# Patient Record
Sex: Male | Born: 1945 | Race: White | Hispanic: No | Marital: Married | Smoking: Never smoker
Health system: Southern US, Community
[De-identification: ages and names within clinical notes are randomized; demographics above are authoritative.]

## PROBLEM LIST (undated history)

## (undated) DIAGNOSIS — K219 Gastro-esophageal reflux disease without esophagitis: Secondary | ICD-10-CM

## (undated) DIAGNOSIS — R490 Dysphonia: Secondary | ICD-10-CM

## (undated) DIAGNOSIS — I219 Acute myocardial infarction, unspecified: Secondary | ICD-10-CM

## (undated) DIAGNOSIS — Z87442 Personal history of urinary calculi: Secondary | ICD-10-CM

## (undated) DIAGNOSIS — R519 Headache, unspecified: Secondary | ICD-10-CM

## (undated) DIAGNOSIS — E039 Hypothyroidism, unspecified: Secondary | ICD-10-CM

## (undated) DIAGNOSIS — F32A Depression, unspecified: Secondary | ICD-10-CM

## (undated) DIAGNOSIS — E119 Type 2 diabetes mellitus without complications: Secondary | ICD-10-CM

## (undated) DIAGNOSIS — G4700 Insomnia, unspecified: Secondary | ICD-10-CM

## (undated) DIAGNOSIS — T7840XA Allergy, unspecified, initial encounter: Secondary | ICD-10-CM

## (undated) DIAGNOSIS — I10 Essential (primary) hypertension: Secondary | ICD-10-CM

## (undated) DIAGNOSIS — E785 Hyperlipidemia, unspecified: Secondary | ICD-10-CM

## (undated) DIAGNOSIS — G709 Myoneural disorder, unspecified: Secondary | ICD-10-CM

## (undated) DIAGNOSIS — F419 Anxiety disorder, unspecified: Secondary | ICD-10-CM

## (undated) DIAGNOSIS — R0600 Dyspnea, unspecified: Secondary | ICD-10-CM

## (undated) DIAGNOSIS — I2510 Atherosclerotic heart disease of native coronary artery without angina pectoris: Secondary | ICD-10-CM

## (undated) DIAGNOSIS — F329 Major depressive disorder, single episode, unspecified: Secondary | ICD-10-CM

## (undated) DIAGNOSIS — T8859XA Other complications of anesthesia, initial encounter: Secondary | ICD-10-CM

## (undated) DIAGNOSIS — I119 Hypertensive heart disease without heart failure: Secondary | ICD-10-CM

## (undated) DIAGNOSIS — M1990 Unspecified osteoarthritis, unspecified site: Secondary | ICD-10-CM

## (undated) DIAGNOSIS — G4733 Obstructive sleep apnea (adult) (pediatric): Secondary | ICD-10-CM

## (undated) DIAGNOSIS — C61 Malignant neoplasm of prostate: Secondary | ICD-10-CM

## (undated) HISTORY — DX: Insomnia, unspecified: G47.00

## (undated) HISTORY — PX: EYE SURGERY: SHX253

## (undated) HISTORY — PX: OTHER SURGICAL HISTORY: SHX169

## (undated) HISTORY — DX: Type 2 diabetes mellitus without complications: E11.9

## (undated) HISTORY — DX: Depression, unspecified: F32.A

## (undated) HISTORY — DX: Atherosclerotic heart disease of native coronary artery without angina pectoris: I25.10

## (undated) HISTORY — DX: Hypothyroidism, unspecified: E03.9

## (undated) HISTORY — DX: Allergy, unspecified, initial encounter: T78.40XA

## (undated) HISTORY — DX: Malignant neoplasm of prostate: C61

## (undated) HISTORY — PX: PROSTATE SURGERY: SHX751

## (undated) HISTORY — DX: Hypertensive heart disease without heart failure: I11.9

## (undated) HISTORY — DX: Hyperlipidemia, unspecified: E78.5

## (undated) HISTORY — DX: Obstructive sleep apnea (adult) (pediatric): G47.33

## (undated) HISTORY — PX: CHOLECYSTECTOMY: SHX55

## (undated) HISTORY — PX: CORONARY ARTERY BYPASS GRAFT: SHX141

## (undated) HISTORY — PX: JOINT REPLACEMENT: SHX530

## (undated) HISTORY — PX: CARPAL TUNNEL RELEASE: SHX101

## (undated) HISTORY — DX: Essential (primary) hypertension: I10

## (undated) HISTORY — DX: Major depressive disorder, single episode, unspecified: F32.9

## (undated) HISTORY — DX: Gastro-esophageal reflux disease without esophagitis: K21.9

---

## 2001-03-11 ENCOUNTER — Encounter: Admission: RE | Admit: 2001-03-11 | Discharge: 2001-06-09 | Payer: Self-pay | Admitting: Family Medicine

## 2004-01-20 ENCOUNTER — Inpatient Hospital Stay (HOSPITAL_COMMUNITY): Admission: AD | Admit: 2004-01-20 | Discharge: 2004-01-26 | Payer: Self-pay | Admitting: Cardiology

## 2004-01-20 ENCOUNTER — Encounter: Payer: Self-pay | Admitting: *Deleted

## 2004-02-21 ENCOUNTER — Encounter (HOSPITAL_COMMUNITY): Admission: RE | Admit: 2004-02-21 | Discharge: 2004-05-21 | Payer: Self-pay | Admitting: Cardiology

## 2004-08-22 ENCOUNTER — Ambulatory Visit (HOSPITAL_COMMUNITY): Admission: RE | Admit: 2004-08-22 | Discharge: 2004-08-22 | Payer: Self-pay | Admitting: Cardiology

## 2004-09-06 ENCOUNTER — Inpatient Hospital Stay (HOSPITAL_COMMUNITY): Admission: RE | Admit: 2004-09-06 | Discharge: 2004-09-11 | Payer: Self-pay | Admitting: Orthopedic Surgery

## 2005-07-31 ENCOUNTER — Encounter (INDEPENDENT_AMBULATORY_CARE_PROVIDER_SITE_OTHER): Payer: Self-pay | Admitting: *Deleted

## 2005-07-31 ENCOUNTER — Ambulatory Visit (HOSPITAL_COMMUNITY): Admission: RE | Admit: 2005-07-31 | Discharge: 2005-07-31 | Payer: Self-pay | Admitting: Gastroenterology

## 2006-03-21 ENCOUNTER — Ambulatory Visit (HOSPITAL_COMMUNITY): Admission: RE | Admit: 2006-03-21 | Discharge: 2006-03-21 | Payer: Self-pay | Admitting: Urology

## 2006-05-27 ENCOUNTER — Inpatient Hospital Stay (HOSPITAL_COMMUNITY): Admission: RE | Admit: 2006-05-27 | Discharge: 2006-05-28 | Payer: Self-pay | Admitting: Urology

## 2006-05-28 ENCOUNTER — Encounter (INDEPENDENT_AMBULATORY_CARE_PROVIDER_SITE_OTHER): Payer: Self-pay | Admitting: *Deleted

## 2006-09-11 ENCOUNTER — Inpatient Hospital Stay (HOSPITAL_COMMUNITY): Admission: RE | Admit: 2006-09-11 | Discharge: 2006-09-15 | Payer: Self-pay | Admitting: Orthopedic Surgery

## 2010-08-30 ENCOUNTER — Ambulatory Visit: Payer: Self-pay | Admitting: Cardiology

## 2010-09-01 ENCOUNTER — Ambulatory Visit: Payer: Self-pay | Admitting: Cardiology

## 2010-10-12 ENCOUNTER — Telehealth (INDEPENDENT_AMBULATORY_CARE_PROVIDER_SITE_OTHER): Payer: Self-pay | Admitting: Radiology

## 2010-10-16 ENCOUNTER — Encounter: Payer: Self-pay | Admitting: Cardiology

## 2010-10-16 ENCOUNTER — Encounter (HOSPITAL_COMMUNITY)
Admission: RE | Admit: 2010-10-16 | Discharge: 2010-11-28 | Payer: Self-pay | Source: Home / Self Care | Attending: Cardiology | Admitting: Cardiology

## 2010-10-16 ENCOUNTER — Ambulatory Visit: Payer: Self-pay

## 2010-11-30 NOTE — Assessment & Plan Note (Signed)
Summary: Cardiology Nuclear Testing  Nuclear Med Background Indications for Stress Test: Evaluation for Ischemia, Graft Patency   History: CABG, Heart Catheterization, Myocardial Infarction, Myocardial Perfusion Study  History Comments: 3/05 MI>CABG; 10/05 Cath:Occ. SVG>PDA & PLA>RCA (Medical therapy); '10 IRS:WNIOEVOJ scar w/ minimal per-infarct ischemia, EF=60%    Symptoms: Chest Pressure, Chest Pressure with Exertion, DOE, Fatigue  Symptoms Comments: Last episode of CP:one month ago.   Nuclear Pre-Procedure Cardiac Risk Factors: Family History - CAD, Hypertension, Lipids, NIDDM Caffeine/Decaff Intake: none NPO After: 7:30 PM Lungs: Clear IV 0.9% NS with Angio Cath: 22g     IV Site: R Hand IV Started by: Doyne Keel, CNMT Chest Size (in) 46     Height (in): 69 Weight (lb): 218 BMI: 32.31 Tech Comments: held meds  Nuclear Med Study 1 or 2 day study:  1 day     Stress Test Type:  Stress Reading MD:  Marca Ancona, MD     Referring MD:  Cassell Clement, MD Resting Radionuclide:  Technetium 47m Tetrofosmin     Resting Radionuclide Dose:  11.0 mCi  Stress Radionuclide:  Technetium 23m Tetrofosmin     Stress Radionuclide Dose:  33.0 mCi   Stress Protocol Exercise Time (min):  10:00 min     Max HR:  136 bpm     Predicted Max HR:  156 bpm  Max Systolic BP: 172 mm Hg     Percent Max HR:  87.18 %     METS: 11.3 Rate Pressure Product:  50093    Stress Test Technologist:  Rea College, CMA-N     Nuclear Technologist:  Doyne Keel, CNMT  Rest Procedure  Myocardial perfusion imaging was performed at rest 45 minutes following the intravenous administration of Technetium 83m Tetrofosmin.  Stress Procedure  The patient exercised for ten minutes.  The patient stopped due to fatigue.  He c/o chest pressure, 3/10, with exercise.  There were no diagnostic ST-T wave changes, only occasional PVC's with rare couplets.  Technetium 70m Tetrofosmin was injected at peak exercise and myocardial  perfusion imaging was performed after a brief delay.  QPS Raw Data Images:  Normal; no motion artifact; normal heart/lung ratio. Stress Images:  Moderate perfusion defect throughout the inferior wall.  Rest Images:  Basal to mid inferior perfusion defect.  Subtraction (SDS):  Partially reversible inferior perfusion defect.  Transient Ischemic Dilatation:  0.87  (Normal <1.22)  Lung/Heart Ratio:  0.37  (Normal <0.45)  Quantitative Gated Spect Images QGS EDV:  80 ml QGS ESV:  32 ml QGS EF:  60 % QGS cine images:  Basal inferior hypokinesis.    Overall Impression  Exercise Capacity: Excellent exercise capacity. BP Response: Normal blood pressure response. Clinical Symptoms: Mild chest pressure  ECG Impression: No significant ST segment change suggestive of ischemia. Overall Impression: Partially reversible moderate-sized inferior perfusion defect suggests infarction with a significant component of adjacent ischemia.   Appended Document: Cardiology Nuclear Testing copysent to Dr. Patty Sermons

## 2010-11-30 NOTE — Progress Notes (Signed)
Summary: Nuc Pre-Procedure  Phone Note Outgoing Call Call back at Work Phone [phone]   Call placed by: Leonia Corona, RT-N,  October 12, 2010 2:59 PM Call placed to: Patient Reason for Call: Confirm/change Appt Summary of Call: Left message with information on My[employer] Information Sheet (see scanned document for details).      Nuclear Med Background Indications for Stress Test: Evaluation for Ischemia, Graft Patency   History: CABG, Heart Catheterization, Myocardial Infarction, Myocardial Perfusion Study  History Comments: 3/05- MI >CABG(X6) 10/05- Cath- Occluded SVG-PDA/PLA-RCA (Medical therapy) 12/10- MPS- Inf. scar w/ minimal per-infarct ischemia. EF= 60%    Symptoms: Chest Pain  Symptoms Comments: Chest pain with radiation into left arm   Nuclear Pre-Procedure Cardiac Risk Factors: Hypertension, Lipids, NIDDM

## 2011-01-01 ENCOUNTER — Other Ambulatory Visit: Payer: Self-pay

## 2011-01-03 ENCOUNTER — Ambulatory Visit: Payer: Self-pay | Admitting: Cardiology

## 2011-02-23 ENCOUNTER — Other Ambulatory Visit: Payer: Self-pay | Admitting: *Deleted

## 2011-02-26 ENCOUNTER — Ambulatory Visit: Payer: Self-pay | Admitting: Cardiology

## 2011-02-27 ENCOUNTER — Other Ambulatory Visit: Payer: Self-pay | Admitting: *Deleted

## 2011-02-27 DIAGNOSIS — E119 Type 2 diabetes mellitus without complications: Secondary | ICD-10-CM

## 2011-02-27 DIAGNOSIS — E7800 Pure hypercholesterolemia, unspecified: Secondary | ICD-10-CM

## 2011-03-16 NOTE — Op Note (Signed)
[name], [name]             ACCOUNT NO.:  [id number]   MEDICAL RECORD NO.:  [id number]          PATIENT TYPE:  INP   LOCATION:  0002                         FACILITY:  [facility]   PHYSICIAN:  Georges Lynch. Gioffre, M.D.DATE OF BIRTH:  1945-10-30   DATE OF PROCEDURE:  09/11/2006  DATE OF DISCHARGE:                                 OPERATIVE REPORT   SURGEON:  Georges Lynch. Darrelyn Hillock, M.D.   ASSISTANT:  Arlyn Leak, PA   PREOPERATIVE DIAGNOSIS:  Degenerative arthritis, left knee.   POSTOPERATIVE DIAGNOSIS:  Degenerative arthritis, left knee.   OPERATION:  Left total knee arthroplasty utilizing DePuy system.  All three  components were cemented.  I utilized a Designer, industrial/product insert.  The sizes used were 38 mm patella.  The femoral component was a size 5 left  posterior cruciate sacrificing, the tibial tray with a size 5 cemented, the  tibial insert was a size 5 10 mm thickness.  Vancomycin was used in the  cement.   PROCEDURE:  Under general anesthesia, routine orthopedic prep and draping of  the left lower extremity carried out.  He had 600 mg of clindamycin IV  preop.  Following that after the prep and drape carried out.  We elevated  tourniquet after exsanguinating the extremity with an Esmarch.  We elevated  the tourniquet and 350 mmHg.  Following that incision was made over the  anterior aspect of the left knee.  Bleeders identified and cauterized.  At  this time two flaps were created.  Self-retaining retractors were inserted.  I then carried out a median parapatellar approach reflected patella  laterally and then flexed the knee and did medial and lateral meniscectomy  and excised the anterior and posterior cruciate ligaments.  Following that,  initial drill hole was made in the intercondylar notch,  #1 jig was inserted  10 mm thickness was removed from the distal femur.  A #2 jig then was  inserted.  I carried out my anterior posterior and chamfer cuts in the usual  fashion for a size 5 posterior cruciate sacrificing femoral component.  After that we then prepared the tibia.  We removed the spurs the tibia,  debrided all the synovium in the area.  We then measured our tibia to be  approximately a size 5.  We then made our initial drill hole the tibial  plateau inserted #1 jig and removed 6 mm thickness off of the affected left  medial side of the tibia.  This time we then utilized our spacers.  Once we  were happy with our soft tissue tensioning, we then cut our notch cut out of  the distal femur in the usual fashion.  We after the femur was prepared, we  then inserted our trial components went through trial range of motion, had  excellent stability in all planes in excellent motion.  We then cut our  patella.  We did a resurfacing patella for a size 38 mm patella.  Then three  drill holes were made in the patella, the articular surface of the patella  that is.  We then inserted the  trial patella.  It fit quite nicely.  We then  removed all trial components, thoroughly water picked out the knee.  We  injected the knee then with 1/2% Marcaine about 60 mL and also with Toradol  30 mg.  Once we did this we then water picked out the knee, dried the knee  out cemented all three components in simultaneously.  We removed all loose  pieces of cement.  We inserted our trial components tibial component.  We  felt a size 5 was a stable fit.  We then removed the trial tibial component,  irrigated knee out again, searched for cement, made sure there no loose  pieces of cement.  We then inserted our permanent tibial rotating  platform size 5 10 mm thickness, reduced the knee went through motion had  excellent stability.  We then utilized the thrombin spray, we inserted our  Hemovac drain and closed the wound layers usual fashion.  Skin was closed  metal staples.  Sterile Neosporin dressing was applied.  The patient had  clindamycin 600 mg IV preop.            ______________________________  Georges Lynch. Darrelyn Hillock, M.D.     RAG/MEDQ  D:  09/11/2006  T:  09/11/2006  Job:  [id number]   cc:   Cassell Clement, M.D.  Fax: [phone]

## 2011-03-16 NOTE — Consult Note (Signed)
NAME:  Paul [name], Paul [name]                       ACCOUNT NO.:  [id number]   MEDICAL RECORD NO.:  [id number]                   PATIENT TYPE:  INP   LOCATION:  2311                                 FACILITY:  [facility]   PHYSICIAN:  Evelene Croon, M.D.                  DATE OF BIRTH:  [birth year]   DATE OF CONSULTATION:  01/20/2004  DATE OF DISCHARGE:                                   CONSULTATION   REASON FOR CONSULTATION:  Severe three vessel coronary artery disease,  status post acute myocardial infarction, with unstable angina.   HISTORY OF PRESENT ILLNESS:  This patient is a 65 year old white male with a  history of hypertension, type 2 diabetes, and known heart disease, who has  not followed up with a cardiologist for many years.  Last Sunday, he  developed prolonged substernal chest pain at rest that lasted about 6 hours.  This pain waxed and waned, and then resolved completely.  He said that her  had no chest pain on Monday, but felt tired.  During the week, he had some  exertional-related chest tightness.  On the evening of January 19, 2004, he  developed chest pain at rest that persisted all night, and radiated into  both arms and shoulder blades.  He had mild nausea and diaphoresis.  He did  not seek attention until this morning when his chest pain continued.  His  initial CK was 4.7, and his troponin level was 4.47.  Electrocardiogram  showed normal sinus rhythm with left ventricular hypertrophy and inferior  infarction of undetermined age with T wave inversions with lateral ischemia.  He was taken to the cardiac catheterization laboratory with ongoing chest  pain this afternoon.  This showed about 30% distal left main stenosis.  The  LAD had 90% proximal bifurcation stenosis at the take off and first  diagonal.  There was diffuse 80% to 90% mid-LAD stenosis, and 80% osteal  first diagonal stenosis.  The left circumflex had 95% proximal and first  marginal stenosis with an occlusion of  a large sub-branch of OM-1.  There  was 80% distal left circumflex stenosis.  The right coronary artery was a  large vessel with 99% stenosis at the distal bifurcation of the posterior  descending and posterolateral branches with TIMI-1 flow.  There were left to  right collaterals.  Left ventricular ejection fraction about 55%, with  inferior hypokinesis.  The patient had ongoing chest pain with  catheterization that he rated 2-3 out of 10, despite maximal medical  treatment, and therefore an anterior balloon pump was placed.  At the  present time, he said that his pain was essentially gone.   PAST MEDICAL HISTORY:  1. Significant for type 2 diabetes for 2-3 years.  He does not check his     sugars regularly.  2. History of hypertension.  3. History of hypercholesterolemia.  4. History of kidney stones.  5. History of arthritis involving his knees.  He is status post right knee     surgery x2 for degenerative disease.   MEDICATIONS:  1. Dilacor 240 mg daily.  2. Cozaar 100 mg daily.  3. Glucophage 500 mg daily.  4. Sudafed p.r.n.  5. Guaifenesin p.r.n.  6. Flonase p.r.n.  7. Mucinex p.r.n.   ALLERGIES:  PENICILLIN.   REVIEW OF SYSTEMS:  GENERAL:  He denies fever or chills.  Has had no recent  weight changes.  He has had at least a 1-year history of fatigue.  HEENT:  Eyes negative.  ENT - he does have sinus problems.  ENDOCRINE:  He has type  2 diabetes.  Denies hypothyroidism.  CARDIOVASCULAR:  As above.  He has had  some exertional shortness of breath over the past year.  He denies  palpitations.  He has had no peripheral edema.  Denies PND or orthopnea.  RESPIRATORY:  Denies cough or sputum production.  GI:  He has had no nausea  or vomiting.  He denies melena and bright red blood per rectum.  He reports  a long history of heartburn type symptoms.  GU:  He denies dysuria and  hematuria.  PSYCHIATRIC:  Negative.  NEUROLOGICAL:  He denies any focal  weakness or numbness.   Denies dizziness and syncope.  MUSCULOSKELETAL:  He  has osteoarthritis of his right knee.   FAMILY HISTORY:  Positive for hypertension and stroke.   SOCIAL HISTORY:  He is married with 2 children.  He has never smoked.  He  does not drink alcohol.  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   PHYSICAL EXAMINATION:  VITAL SIGNS:  His blood pressure is 122/65, and his  pulse is 68 and regular, respiratory rate 16 and unlabored.  GENERAL:  He is an obese white male in no distress.  HEENT:  Normocephalic and atraumatic.  Pupils equal, round and reactive to  light and accommodation.  Extraocular muscles are intact.  His throat is  clear.  NECK:  Normal carotid pulses bilaterally.  There are no bruits, no  adenopathy.  There is no thyromegaly.  CARDIAC:  Regular rate and rhythm with normal S1 and S2.  There is no  murmur, rub, or gallop.  LUNGS:  Clear.  ABDOMEN:  Active bowel sounds.  His abdomen was soft, obese, and nontender.  There were no palpable masses or organomegaly.  EXTREMITIES:  There is no peripheral edema.  There are old scars on the  right knee from a previous surgery.  Pedal pulses are palpable bilaterally.  SKIN:  Warm and dry.  NEUROLOGIC EXAM:  Alert and oriented x3.  Motor and sensory exam is grossly  normal.   IMPRESSION:  Mr. Frei has severe three vessel coronary disease,  presenting with a subacute myocardial infarction and ongoing chest pain.  I  agree that coronary artery bypass graft surgery is the best treatment for  this patient.  I discussed the operative procedure with him and his wife and  son, including alternatives, benefits, and risks, including bleeding, blood  transfusion, infection, stroke, myocardial infarction, and they understand  and agree to proceed with surgery.  We will plan to perform this in the  morning, on January 21, 2004.  Evelene Croon, M.D.    BB/MEDQ  D:   01/20/2004  T:  01/22/2004  Job:  [id number]

## 2011-03-16 NOTE — Cardiovascular Report (Signed)
Paul [name], WINTERBOTTOM NO.:  [id number]   MEDICAL RECORD NO.:  [id number]          PATIENT TYPE:  OIB   LOCATION:  2899                         FACILITY:  [facility]   PHYSICIAN:  Peter M. Swaziland, M.D.  DATE OF BIRTH:  05/28/1946   DATE OF PROCEDURE:  08/22/2004  DATE OF DISCHARGE:                              CARDIAC CATHETERIZATION   INDICATION FOR PROCEDURE:  A 65 year old white male, status post inferior  myocardial infarction with subsequent coronary artery bypass surgery in  March of 2005.  He has had recent symptoms of exertional angina and  Cardiolite study showed evidence of inferior wall ischemia.   PROCEDURES:  1.  Left heart catheterization.  2.  Coronary angiography.  3.  Saphenous vein graft angiography x 3.  4.  Left internal mammary artery graft angiography.  5.  Left ventriculography.   ACCESS:  Via the right femoral artery using standard Seldinger technique.   EQUIPMENT:  1.  6 French 4 cm right and left Judkins catheter.  2.  6 French pigtail catheter.  3.  6 French LIMA catheter.  4.  6 French LCB catheter.   MEDICATIONS:  1.  Local anesthesia with 1% Xylocaine.  2.  Versed 2 mg IV.   CONTRAST:  200 mL of Omnipaque.   HEMODYNAMIC DATA:  Aortic pressure 145/86 with a mean of 111.  Left  ventricular pressure 145 with an EDP of 13 mmHg.   ANGIOGRAPHIC DATA:  The left coronary artery arises and distributes  normally.  The left main coronary artery has 20% irregularities in the  mid  vessel.   The left anterior descending artery has a 90% stenosis proximally followed  by a total occlusion of the mid vessel.  The first and second diagonal  branches were occluded proximally.   The left circumflex coronary artery gives rise to a single large marginal  vessel that is occluded proximally.  The distal circumflex is small and  diffusely diseased up to 30-40%.   The right coronary artery rises normally.  It has a complex 90% stenosis at  the  origin followed by total occlusion proximally.  The distal right  coronary artery fills by left to right collaterals.   The saphenous vein graft to the posterior descending and posterolateral  branches of the right coronary artery is occluded proximally.   The saphenous vein graft sequentially to the first diagonal and first obtuse  marginal vessel is widely patent.   The saphenous vein graft to the second diagonal is widely patent.   The LIMA graft to the LAD was very difficult to visualize.  The left  subclavian had a very acute angulation prior to the IMA and then the IMA had  another acute angulated takeoff from the subclavian.  We were able to  adequately visualize the vessel with left subclavian angiography and this  showed excellent patency throughout and excellent filling of the LAD.   Left ventriculography was performed in the RAO view.  This demonstrates  normal left ventricular size with mild inferior wall hypokinesis.  Overall  ejection fraction is estimated at 50-55%.  There  is no significant mitral  insufficiency.   FINAL INTERPRETATION:  1.  Severe three-vessel occlusive atherosclerotic coronary artery disease.  2.  Well-preserved left ventricular function.  3.  Patent saphenous vein graft to the first diagonal and first obtuse      marginal branch.  4.  Patent saphenous vein graft to the second diagonal branch.  5.  Occluded saphenous vein graft to the PDA and PLOM of the right coronary      artery.  6.  Patent LIMA graft to the LAD.   PLAN:  Since the right coronary vessels are supplied by good collaterals, I  would recommend continued medical therapy at this point.  Neither the  occluded vein graft or the native right coronary artery appeared to be  suitable for catheter based intervention.       PMJ/MEDQ  D:  08/22/2004  T:  08/22/2004  Job:  [id number]   cc:   Cassell Clement, M.D.  [address], [address]  [place]  [place] [address]  Fax: [phone]    Evelene Croon, M.D.  [address]  [place]  [place] [address]  Fax: [phone]   Jamie Kato  [address]  [place]  [place] [address]  Fax: [phone]

## 2011-03-16 NOTE — H&P (Signed)
Paul [name], Paul [name]             ACCOUNT NO.:  [id number]   MEDICAL RECORD NO.:  [id number]          PATIENT TYPE:  INP   LOCATION:  NA                           FACILITY:  [facility]   PHYSICIAN:  Georges Lynch. Gioffre, M.D.DATE OF BIRTH:  04-02-1946   DATE OF ADMISSION:  09/11/2006  DATE OF DISCHARGE:                                HISTORY & PHYSICAL   CHIEF COMPLAINT:  Left knee pain.   HISTORY OF PRESENT ILLNESS:  Patient is a 65 year old gentleman who will be  admitted on November 14th for a left total knee arthroplasty that patient  has a medial joint near bone-on-bone on x-rays.  He has complaints of pain  in his knee, difficulty with range of motion.  He has failed conservative  treatments.  Plan to proceed with a total knee arthroplasty.   ALLERGIES:  PENICILLIN, YELLOW JACKETS.   CURRENT MEDICATIONS:  1. Toprol XL 50 mg a day.  2. Hyzaar 100 mg a day.  3. Norvasc 5 mg a day.  4. Glucophage 500 mg, two tablets a day.  5. Zocor 20 mg a day.  6. Prilosec 20 mg a day.  7. Ecotrin 325 mg a day.  8. Flonase p.r.n.  9. Mucinex p.r.n.  10.Centrum Silver, one tablet a day.  11.Fish oil 1000 mg a day.   PAST MEDICAL HISTORY:  1. He does have some hypertension and a history of some coronary artery      disease, with neck swollen, stable angina.  2. History of hiatal hernia and reflux disease.  3. Hemorrhoids.  4. Prostate cancer with urinary incontinence.  5. History of kidney stones.  6. Past history of asthma.  7. History of anxiety/depression.  8. Diabetes.   PAST SURGICAL HISTORY:  1. Arthroscopic surgery on his right knee.  2. Six-way heart bypass surgery in 2005.  3. Right knee replacement in 2005.  4. Prostatectomy 2007.  5. Patient indicates that his only complications were with hiccups and      hallucinations.   MEDICAL DOCTOR:  Cassell Clement, M.D.  Urology is Dr. Wanda Plump in [place].   FAMILY MEDICAL HISTORY:  Both parents and maternal grandparents have  heart  disease.  Both parents have hypertension.  Sister and maternal grandmother  have diabetes.  Does have a history of breast cancer in the family on his  sister's side.   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  He has two sons.  He  lives in a two-story house.   REVIEW OF SYSTEMS:  Positive for some blurred vision.  It has been many  years, since he has had his last asthma attack.  He has had some bronchitis  pneumonia in the past.  He does have some occasional shortness of breath  with exertion.  He does currently come dealing with urinary incontinence,  since his prostatectomy.  Last kidney stone was about 10 years ago.   PHYSICAL EXAMINATION:  VITAL SIGNS:  His height is 5' 8, weight is 212  pounds, blood pressure is  126/82.  Pulse is 70 and regular, respirations 12.  Patient is afebrile.  GENERAL:  This is a healthy-appearing well-developed, 65 year old gentleman,  conscious, alert and appropriate, easily gets himself on and off the exam  table, appears to be in no extreme distress.  HEENT:  Head is normocephalic.  Pupils equal, round and reactive.  Extraocular movements intact.  He does have an upper denture plate in place.  Buccal mucosa appears to be pink and moist.  NECK:  Supple.  No palpable lymphadenopathy.  Thyroid region was nontender.  He had good range of motion of his cervical spine.  CHEST:  Lung sounds were clear and equal bilaterally.  No wheezes, rales,  rhonchi present.  HEART:  He had a well-healed midline sternal incision.  His heart sounds are  regular in rhythm, no obvious murmurs.  ABDOMEN:  Soft, nontender, normoactive bowel sounds.  EXTREMITIES:  Upper extremities were symmetrical in size and shape.  He had  excellent range of motion of the shoulders, elbows and wrists.  Motor  strength was 5/5.  Lower extremities:  Right and left hip had full  extension, flexion  up to 130 degrees with 20 degrees internal, external  rotation without any difficulty.  Right knee had a well-healed midline  anterior incision, no effusion.  He had a typical mechanical total knee  clunk, but he had full extension, flexion, back to 110 degrees.  No  instability.  The cap was soft.  His left knee was boggy appearing with no  signs of erythema.  He was able to fully extend.  He was able to flex it  back to 100 degrees.  He had no instability.  Calf was soft, nontender.  Ankles were symmetric with good dorsi-plantar flexion.  Peripheral vascular  and carotid pulses were 2+ with no bruits.  Radial pulses were 2+.  He had  2+ dorsalis pedis pulses.  He had no lower extremity edema.  NEURO:  Patient was conscious, alert and appropriate.  No gross neurologic  defects noted.  Breast, rectal and GU exams were deferred at this time   Results of recent cardiac stress test by Dr. Patty Sermons was listed at stable.  Overall impression indicated old inferior wall scar with minimal  reversibility, good overall LV systolic function, improved ejection  fraction, and exercise time since 2006 Cardiolite.  Ejection fraction was  estimated at 58%.   PLAN:  Patient will undergo all routine labs and tests, prior to having a  left total knee arthroplasty by Dr. Darrelyn Hillock at [facility] on  November 14th.      Jamelle Rushing, P.A.    ______________________________  Georges Lynch Darrelyn Hillock, M.D.    RWK/MEDQ  D:  09/06/2006  T:  09/06/2006  Job:  [id number]   cc:   Windy Fast A. Darrelyn Hillock, M.D.  Fax: [phone]

## 2011-03-16 NOTE — Discharge Summary (Signed)
Paul [name], Paul [name]             ACCOUNT NO.:  [id number]   MEDICAL RECORD NO.:  [id number]          PATIENT TYPE:  INP   LOCATION:  1521                         FACILITY:  [facility]   PHYSICIAN:  Georges Lynch. Gioffre, M.D.DATE OF BIRTH:  02-07-1946   DATE OF ADMISSION:  09/11/2006  DATE OF DISCHARGE:  09/15/2006                               DISCHARGE SUMMARY   ADMISSION DIAGNOSES:  1. End-stage osteoarthritis left knee.  2. Hypertension.  3. Coronary artery disease.  4. Hiatal hernia with reflux.  5. Prostate cancer with urinary incontinence.  6. History of kidney stones.  7. History of  depression.  8. Diabetes.   DISCHARGE DIAGNOSES:  1. Left total knee arthroplasty.  2. History of coronary artery disease.  3. Stable angina.  4. Hypertension.  5. Hiatal hernia with reflux disease.  6. History of prostate cancer with urinary incontinence.  7. History of  anxiety and depression.  8. History of diabetes.   HISTORY OF PRESENT ILLNESS:  The patient is a 65 year old gentleman who  is __________ related to his left knee who had painful range of motion,  ambulating who has failed conservative treatment. X-rays reveal bone on  bone of the medial compartment.  The patient wishes to proceed with  total knee arthroplasty.   ALLERGIES:  PENICILLIN AND YELLOWJACKET'S.   MEDICATIONS ON ADMISSION:  1. Toprol XL 50 mg a day.  2. Hyzaar 100 mg a day.  3. Norvasc 5 mg a day.  4. Glucophage 500 mg two tablets a day.  5. Zocor 20 mg a day.  6. Prilosec 20 mg a day.  7. Ecotrin 325 mg  a day.  8. Flonase p.r.n.  9. Mucinex p.r.n.  10.Centrum Silver one tablet a day.  11.Fish oil 1,000 mg a day.   SURGICAL PROCEDURES:  The patient underwent a left total knee  arthroplasty by Dr. Darrelyn Hillock at [facility] on November 14.  __________  He insisted on general anesthesia. The patient had the  following components implanted:  Size #5 left femoral component, a size  #5 heel to tibial  tray, size #5 10-mm polyethelene bearing, size 38, a 3-  pegged patella __________ and vancomycin __________ .   The patient tolerated the procedure well. Was transferred to recovery  room on the Orthopedic floor in good condition.   CONSULTS:  The following routine consults were requested:  Physical  therapy __________.   HOSPITAL COURSE:  On September 11, 2006 the patient was taken to [facility] under the care of Dr. Darrelyn Hillock.  The patient was taken to  the OR for a left total knee arthroplasty performed without any  complications. The patient was transferred to the recovery room and then  to the orthopedic floor for routine total knee protocol on IV  antibiotics and pain medicines and DVT prophylaxis.   The patient had a total of a 3 days postoperative course in which the  patient had no significant untoward events. The patient was able to  transition from IV medicines to p.o. meds as well. His wound remained  without any signs of infection.  His leg remained neurovascularly  intact.  He had no other medical issues which occurred. The patient  worked well with physical therapy. It was felt on postop day #3 he was  orthopedically ready for discharge to home with outpatient physical  therapy protocol.  He was discharged in good condition.   LABORATORY DATA:  H&H on November 17, date of discharge, was 10.7 with  hematocrit 30.3. INR was 4. Routine chemistries:  Sodium 130, potassium  3.4, glucose 31, BUN 18, creatinine __________. Urinalysis on admission  was normal.   MEDICATIONS UPON DISCHARGE FROM ORTHOPEDIC FLOOR:  1. Heparin 5000 units subcu q.12 h.  2. Colace 100 mg p.o. b.i.d.  3. Ferrous sulfate 325 mg p.o. t.i.d.  4. Reglan 10 mg p.o. q.8 h. p.r.n.  5. Phenergan 25 mg p.o. q.6 h. p.r.n.  6. Robaxin 500 mg p.o. q.6 h. p.r.n.  7. Toprol XL 50 mg p.o. daily.  8. Cozaar 100 mg a day.  9. Hydrochlorothiazide 12.5 mg a day.  10.Zocor 20 mg a day.  11.Norvasc 5 mg  a day.  12.Glucophage 500 mg p.o. b.i.d.  13.Protonix 40 mg a day.  14.Multivitamin with minerals one tablet a day.  15.Omega-3 acid one tablet a day.  16.Nasonex 2 sprays daily.  17.Guaifenesin 600 mg p.o. b.i.d.  18.Percocet one or two tablets every 4-6 hours p.r.n.  19.The patient was managed on a diabetic, insulin sliding scale per      protocol.  20.Coumadin.   DIET:  The patient is maintained on 1800-calorie ADA diet.   ACTIVITY:  The patient is to ambulate with assistance, use of crutches  or walker.   WOUND CARE:  The patient is to change dressing daily.   MEDICATIONS:  1. The patient is to resume routine home meds as taken prior to      hospital with the addition of Coumadin 10 mg a day at the same      time.  2. Percocet one or two tablets every 4-6 hours for pain if needed.  3. Robaxin one tablet three times a day for muscle spasms if needed.  4. Lovenox 40 mg subcu injection, one injection a day for the next two      days.   FOLLOWUP:  The patient is to follow up with Dr. Darrelyn Hillock two weeks from  date of surgery. The patient is to call [phone] for followup  appointment.   Outpatient home health physical therapy and Coumadin management is to be  managed through [facility] services.   CONDITION UPON DISCHARGE TO HOME:  Listed as improved, good.      Jamelle Rushing, P.A.    ______________________________  Georges Lynch Darrelyn Hillock, M.D.    RWK/MEDQ  D:  10/02/2006  T:  10/02/2006  Job:  [id number]

## 2011-03-19 ENCOUNTER — Other Ambulatory Visit (INDEPENDENT_AMBULATORY_CARE_PROVIDER_SITE_OTHER): Payer: Managed Care, Other (non HMO) | Admitting: Cardiology

## 2011-03-19 ENCOUNTER — Other Ambulatory Visit (INDEPENDENT_AMBULATORY_CARE_PROVIDER_SITE_OTHER): Payer: Managed Care, Other (non HMO) | Admitting: *Deleted

## 2011-03-19 DIAGNOSIS — E119 Type 2 diabetes mellitus without complications: Secondary | ICD-10-CM

## 2011-03-19 DIAGNOSIS — Z13220 Encounter for screening for lipoid disorders: Secondary | ICD-10-CM

## 2011-03-19 DIAGNOSIS — E7800 Pure hypercholesterolemia, unspecified: Secondary | ICD-10-CM

## 2011-03-19 LAB — BASIC METABOLIC PANEL
BUN: 21 mg/dL (ref 6–23)
CO2: 27 mEq/L (ref 19–32)
Calcium: 9.2 mg/dL (ref 8.4–10.5)
Chloride: 99 mEq/L (ref 96–112)
Creatinine, Ser: 1.1 mg/dL (ref 0.4–1.5)
GFR: 74.47 mL/min (ref >60.00)
Glucose, Bld: 115 mg/dL — ABNORMAL HIGH (ref 70–99)
Potassium: 3.8 mEq/L (ref 3.5–5.1)
Sodium: 137 mEq/L (ref 135–145)

## 2011-03-19 LAB — HEPATIC FUNCTION PANEL
ALT: 33 U/L (ref 0–53)
AST: 29 U/L (ref 0–37)
Albumin: 3.6 g/dL (ref 3.5–5.2)
Alkaline Phosphatase: 30 U/L — ABNORMAL LOW (ref 39–117)
Bilirubin, Direct: 0.1 mg/dL (ref 0.0–0.3)
Total Bilirubin: 0.6 mg/dL (ref 0.3–1.2)
Total Protein: 6.3 g/dL (ref 6.0–8.3)

## 2011-03-19 LAB — HEMOGLOBIN A1C: Hgb A1c MFr Bld: 7.4 % — ABNORMAL HIGH (ref 4.6–6.5)

## 2011-03-19 LAB — LIPID PANEL
Cholesterol: 134 mg/dL (ref 0–200)
HDL: 32.5 mg/dL — ABNORMAL LOW (ref >39.00)
Total CHOL/HDL Ratio: 4
Triglycerides: 205 mg/dL — ABNORMAL HIGH (ref 0.0–149.0)
VLDL: 41 mg/dL — ABNORMAL HIGH (ref 0.0–40.0)

## 2011-03-19 LAB — LDL CHOLESTEROL, DIRECT: Direct LDL: 89.7 mg/dL

## 2011-03-21 ENCOUNTER — Encounter: Payer: Self-pay | Admitting: Cardiology

## 2011-03-21 ENCOUNTER — Ambulatory Visit (INDEPENDENT_AMBULATORY_CARE_PROVIDER_SITE_OTHER): Payer: Managed Care, Other (non HMO) | Admitting: Cardiology

## 2011-03-21 VITALS — BP 130/80 | HR 74 | Wt 221.0 lb

## 2011-03-21 DIAGNOSIS — F329 Major depressive disorder, single episode, unspecified: Secondary | ICD-10-CM

## 2011-03-21 DIAGNOSIS — Z8546 Personal history of malignant neoplasm of prostate: Secondary | ICD-10-CM | POA: Insufficient documentation

## 2011-03-21 DIAGNOSIS — E785 Hyperlipidemia, unspecified: Secondary | ICD-10-CM

## 2011-03-21 DIAGNOSIS — E119 Type 2 diabetes mellitus without complications: Secondary | ICD-10-CM

## 2011-03-21 DIAGNOSIS — I119 Hypertensive heart disease without heart failure: Secondary | ICD-10-CM

## 2011-03-21 DIAGNOSIS — Z9889 Other specified postprocedural states: Secondary | ICD-10-CM

## 2011-03-21 DIAGNOSIS — Z951 Presence of aortocoronary bypass graft: Secondary | ICD-10-CM | POA: Insufficient documentation

## 2011-03-21 DIAGNOSIS — E1169 Type 2 diabetes mellitus with other specified complication: Secondary | ICD-10-CM | POA: Insufficient documentation

## 2011-03-21 MED ORDER — METFORMIN HCL 500 MG PO TABS: 1000.0000 mg | ORAL_TABLET | Freq: Two times a day (BID) | ORAL | Status: DC

## 2011-03-21 MED ORDER — CITALOPRAM HYDROBROMIDE 20 MG PO TABS: 20.0000 mg | ORAL_TABLET | Freq: Every day | ORAL | Status: DC

## 2011-03-21 MED ORDER — METOPROLOL SUCCINATE ER 50 MG PO TB24: 50.0000 mg | ORAL_TABLET | Freq: Every day | ORAL | Status: DC

## 2011-03-21 NOTE — Assessment & Plan Note (Addendum)
The patient has a history of hypercholesterolemia.  We reviewed his lab work Which shows elevated triglycerides and a low HDL.  His A1c is elevated at 7.4.  He is going to work harder on diet and exercise and continue same medication.

## 2011-03-21 NOTE — Assessment & Plan Note (Signed)
The patient has a history of essential hypertension.  He is on an ARB and on hydrochlorothiazide.  He has not been having any headaches or dizzy spells.

## 2011-03-21 NOTE — Assessment & Plan Note (Signed)
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  He has not had any recent chest pain or angina.  His last nuclear stress test was 10/16/10 and showed partially reversible moderate size inferior perfusion defect which suggest infarction with a significant component of adjacent ischemia.  The stress test was not significantly changed from the prior studies.  The patient has not had to take any recent abnormal nitroglycerin.  He will occasionally develop some mild chest tightness relieved by rest.

## 2011-03-21 NOTE — Assessment & Plan Note (Signed)
The patient has a past history of prostate cancer.  He underwent robotic assisted laparoscopic radical prostatectomy on 05/27/2006 by Dr. Laverle Patter.  The patient is complaining of lack of energy and wonders if he might have a low testosterone condition.  I advised him to discuss that with Dr. Laverle Patter at his next visit

## 2011-03-21 NOTE — Progress Notes (Signed)
Paul [name] [facility] Date of Birth:  02-20-1946 [facility] Cardiology / [facility] [address]   [address] [place], [place]  [address] [phone]           Fax   [phone]  History of Present Illness: This 65 year old gentleman is seen for a scheduled followup office visit.  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  He does have an old inferior wall myocardial infarction with residual peri-infarct ischemia seen on his nuclear stress test most recently on 10/16/10.  His ejection fraction is normal at 60%.  He has a history of essential hypertension which has been controlled with multidrug therapy including losartan HCT, amlodipine, and metoprolol.  He has a history of dyslipidemia and is on simvastatin and fish oil.  He has a history of diabetes and is on oral agents including metformin.  Since last visit his weight has gone up 2 pounds and he has been less physically Active.  The patient does have a history of some mild depression and is on generic Celexa 20 mg daily withImprovement.  Current Outpatient Prescriptions  Medication Sig Dispense Refill  . amLODipine (NORVASC) 5 MG tablet Take 5 mg by mouth daily.        Marland Kitchen aspirin 325 MG tablet Take 325 mg by mouth daily.        . citalopram (CELEXA) 20 MG tablet Take 1 tablet (20 mg total) by mouth daily.  30 tablet  12  . fluticasone (FLONASE) 50 MCG/ACT nasal spray 2 sprays by Nasal route daily.        . GuaiFENesin (MUCINEX PO) Take by mouth. As needed       . losartan-hydrochlorothiazide (HYZAAR) 100-25 MG per tablet Take 1 tablet by mouth daily.        . metFORMIN (GLUCOPHAGE) 500 MG tablet Take 2 tablets (1,000 mg total) by mouth 2 (two) times daily with a meal. Taking 1000mg  in the am and 500mg  in the pm  90 tablet  12  . metoprolol (TOPROL-XL) 50 MG 24 hr tablet Take 1 tablet (50 mg total) by mouth daily.  30 tablet  12  . Multiple Vitamin (MULTIVITAMIN) tablet Take 1 tablet by mouth daily.          . Omega-3 Fatty Acids (FISH OIL) 1000 MG CAPS Take by mouth daily.        Marland Kitchen omeprazole (PRILOSEC) 40 MG capsule Take 40 mg by mouth daily.        . simvastatin (ZOCOR) 40 MG tablet Take 40 mg by mouth at bedtime. Taking 1/2 daily       . tadalafil (CIALIS) 5 MG tablet Take 5 mg by mouth daily as needed. Per Dr. Laverle Patter       . DISCONTD: citalopram (CELEXA) 20 MG tablet Take 20 mg by mouth daily.        Marland Kitchen DISCONTD: metFORMIN (GLUCOPHAGE) 500 MG tablet Take 1,000 mg by mouth 2 (two) times daily with a meal. Taking 1000mg  in the am and 500mg  in the pm       . DISCONTD: metoprolol (TOPROL-XL) 50 MG 24 hr tablet Take 50 mg by mouth daily.          Allergies  Allergen Reactions  . Percocet (Oxycodone-Acetaminophen)     nightmares  . Xanax     No good    Patient Active Problem List  Diagnoses  . Hx of CABG  . Diabetes mellitus  . Benign  hypertensive heart disease without heart failure  . Dyslipidemia associated with type 2 diabetes mellitus  . History of prostate cancer    History  Smoking status  . Never Smoker   Smokeless tobacco  . Not on file    History  Alcohol Use: Not on file    No family history on file.  Review of Systems: Constitutional: no fever chills diaphoresis or fatigue or change in weight.  Head and neck: no hearing loss, no epistaxis, no photophobia or visual disturbance. Respiratory: No cough, shortness of breath or wheezing. Cardiovascular: No chest pain peripheral edema, palpitations. Gastrointestinal: No abdominal distention, no abdominal pain, no change in bowel habits hematochezia or melena. Genitourinary: No dysuria, no frequency, no urgency, no nocturia. Musculoskeletal:No arthralgias, no back pain, no gait disturbance or myalgias. Neurological: No dizziness, no headaches, no numbness, no seizures, no syncope, no weakness, no tremors. Hematologic: No lymphadenopathy, no easy bruising. Psychiatric: No confusion, no hallucinations, no sleep  disturbance.    Physical Exam: Filed Vitals:   03/21/11 0957  BP: 130/80  Pulse: 74  The general appearance feels a well-developed well-nourished gentleman in no distress.Pupils equal and reactive.   Extraocular Movements are full.  There is no scleral icterus.  The mouth and pharynx are normal.  The neck is supple.  The carotids reveal no bruits.  The jugular venous pressure is normal.  The thyroid is not enlarged.  There is no lymphadenopathy.The chest is clear to percussion and auscultation. There are no rales or rhonchi. Expansion of the chest is symmetrical.The precordium is quiet.  The first heart sound is normal.  The second heart sound is physiologically split.  There is no murmur gallop rub or click.  There is no abnormal lift or heave.The abdomen is soft and nontender. Bowel sounds are normal. The liver and spleen are not enlarged. There Are no abdominal masses. There are no bruits.The pedal pulses are good.  There is no phlebitis or edema.  There is no cyanosis or clubbing.Strength is normal and symmetrical in all extremities.  There is no lateralizing weakness.  There are no sensory deficits.   Assessment / Plan: Continue same medication.  Work harder on diet and weight loss and exercise recheck in 4 months for followup office visit and fasting lab work

## 2011-04-12 ENCOUNTER — Telehealth: Payer: Self-pay | Admitting: *Deleted

## 2011-04-12 DIAGNOSIS — K30 Functional dyspepsia: Secondary | ICD-10-CM

## 2011-04-12 DIAGNOSIS — R142 Eructation: Secondary | ICD-10-CM

## 2011-04-12 NOTE — Telephone Encounter (Signed)
Patient wife phoned yesterday and I called and left message.  Never heard back and called back again later in afternoon.  Spoke with patient and he was complaining of abdominal pain, knot in top of stomach, indigestion, belching, and pain in right shoulder area and across back.  Stated pain was really bad night before last and yesterday afternoon just sore, but felt better.  Stated he and Dr. Patty Sermons had discussed some similar symptoms at a previous office visit and thought might be gallbladder related.  Discussed with Dr. Patty Sermons and schedule abdominal ultra sound for 6/15 am.

## 2011-04-12 NOTE — Telephone Encounter (Signed)
Agree with plan 

## 2011-04-13 ENCOUNTER — Ambulatory Visit
Admission: RE | Admit: 2011-04-13 | Discharge: 2011-04-13 | Disposition: A | Discharge status: Home / Self Care | Payer: Managed Care, Other (non HMO) | Source: Ambulatory Visit | Attending: Cardiology | Admitting: Cardiology

## 2011-04-13 ENCOUNTER — Telehealth: Payer: Self-pay | Admitting: *Deleted

## 2011-04-13 DIAGNOSIS — K30 Functional dyspepsia: Secondary | ICD-10-CM

## 2011-04-13 DIAGNOSIS — R142 Eructation: Secondary | ICD-10-CM

## 2011-04-13 NOTE — Telephone Encounter (Signed)
Patient advised of results of test and has appt. With Dr. Zachery Dakins on Monday

## 2011-04-16 ENCOUNTER — Telehealth: Payer: Self-pay | Admitting: Cardiology

## 2011-04-16 ENCOUNTER — Other Ambulatory Visit (INDEPENDENT_AMBULATORY_CARE_PROVIDER_SITE_OTHER): Payer: Self-pay | Admitting: General Surgery

## 2011-04-16 NOTE — Telephone Encounter (Signed)
[phone] Medication list, and any recent labs

## 2011-04-17 ENCOUNTER — Other Ambulatory Visit (INDEPENDENT_AMBULATORY_CARE_PROVIDER_SITE_OTHER): Payer: Self-pay | Admitting: General Surgery

## 2011-04-17 ENCOUNTER — Inpatient Hospital Stay (HOSPITAL_COMMUNITY)
Admission: AD | Admit: 2011-04-17 | Discharge: 2011-04-18 | DRG: 419 | Disposition: A | Discharge status: Home / Self Care | Payer: Managed Care, Other (non HMO) | Source: Ambulatory Visit | Attending: Surgery | Admitting: Surgery

## 2011-04-17 DIAGNOSIS — Z880 Allergy status to penicillin: Secondary | ICD-10-CM

## 2011-04-17 DIAGNOSIS — K8000 Calculus of gallbladder with acute cholecystitis without obstruction: Principal | ICD-10-CM | POA: Diagnosis present

## 2011-04-17 DIAGNOSIS — E119 Type 2 diabetes mellitus without complications: Secondary | ICD-10-CM | POA: Diagnosis present

## 2011-04-17 DIAGNOSIS — K8010 Calculus of gallbladder with chronic cholecystitis without obstruction: Secondary | ICD-10-CM | POA: Diagnosis present

## 2011-04-17 DIAGNOSIS — I2510 Atherosclerotic heart disease of native coronary artery without angina pectoris: Secondary | ICD-10-CM | POA: Diagnosis present

## 2011-04-17 DIAGNOSIS — Z96659 Presence of unspecified artificial knee joint: Secondary | ICD-10-CM

## 2011-04-17 DIAGNOSIS — I10 Essential (primary) hypertension: Secondary | ICD-10-CM | POA: Diagnosis present

## 2011-04-17 DIAGNOSIS — Z9861 Coronary angioplasty status: Secondary | ICD-10-CM

## 2011-04-17 LAB — GLUCOSE, CAPILLARY
Glucose-Capillary: 118 mg/dL — ABNORMAL HIGH (ref 70–99)
Glucose-Capillary: 176 mg/dL — ABNORMAL HIGH (ref 70–99)

## 2011-04-17 LAB — COMPREHENSIVE METABOLIC PANEL
AST: 35 U/L (ref 0–37)
Albumin: 4.2 g/dL (ref 3.5–5.2)
Alkaline Phosphatase: 42 U/L (ref 39–117)
BUN: 29 mg/dL — ABNORMAL HIGH (ref 6–23)
Calcium: 9.8 mg/dL (ref 8.4–10.5)
Chloride: 98 mEq/L (ref 96–112)
Glucose, Bld: 206 mg/dL — ABNORMAL HIGH (ref 70–99)
Potassium: 4.1 mEq/L (ref 3.5–5.3)
Sodium: 136 mEq/L (ref 135–145)
Total Protein: 6.9 g/dL (ref 6.0–8.3)

## 2011-04-17 LAB — CBC
HCT: 36.8 % — ABNORMAL LOW (ref 39.0–52.0)
Hemoglobin: 12.7 g/dL — ABNORMAL LOW (ref 13.0–17.0)
MCHC: 34.5 g/dL (ref 30.0–36.0)
WBC: 6.2 10*3/uL (ref 4.0–10.5)

## 2011-04-18 LAB — CBC
Platelets: 204 10*3/uL (ref 150–400)
RBC: 4.09 MIL/uL — ABNORMAL LOW (ref 4.22–5.81)
RDW: 12.4 % (ref 11.5–15.5)
WBC: 8 10*3/uL (ref 4.0–10.5)

## 2011-04-18 LAB — BASIC METABOLIC PANEL
CO2: 27 mEq/L (ref 19–32)
Chloride: 95 mEq/L — ABNORMAL LOW (ref 96–112)
GFR calc Af Amer: >60 mL/min (ref >60)
Potassium: 4.3 mEq/L (ref 3.5–5.1)
Sodium: 132 mEq/L — ABNORMAL LOW (ref 135–145)

## 2011-04-18 LAB — GLUCOSE, CAPILLARY: Glucose-Capillary: 179 mg/dL — ABNORMAL HIGH (ref 70–99)

## 2011-04-19 NOTE — Op Note (Signed)
Paul [name], Paul [name] NO.:  [id number]  MEDICAL RECORD NO.:  [id number]  LOCATION:  1537                         FACILITY:  [facility]  PHYSICIAN:  Juanetta Gosling, MDDATE OF BIRTH:  May 04, 1946  DATE OF PROCEDURE:  04/17/2011 DATE OF DISCHARGE:                              OPERATIVE REPORT   PREOPERATIVE DIAGNOSIS:  Acute cholecystitis.  POSTOPERATIVE DIAGNOSIS:  Acute cholecystitis.  PROCEDURE:  Laparoscopic cholecystectomy.  SURGEON:  Troy Sine. Dwain Sarna, MD  ASSISTANT:  Brayton El, PA-C  ANESTHESIA:  General.  SPECIMENS:  Gallbladder contents to Pathology.  ESTIMATED BLOOD LOSS:  Minimal.  COMPLICATIONS:  None.  DRAINS:  None.  DISPOSITION:  To recovery room in stable condition.  INDICATION:  This is a 65 year old male who has had about a week of right upper quadrant abdominal pain as well as back pain.  He was evaluated and underwent an ultrasound that showed a thickened gallbladder with stones that appeared to clinically have cholecystitis as well.  He is diabetic.  He was evaluated by Dr. Zachery Dakins, in her urgent office yesterday afternoon.  Dr. Zachery Dakins called him this morning to see how he is doing.  He had described he had more pain and was brought to [facility] to discuss cholecystectomy procedure.  I then met him today and discussed laparoscopic cholecystectomy with risks and benefits associated with that procedure.    After informed consent was obtained, the patient was first placed on ciprofloxacin due to his multiple allergies.  He had sequential compression devices placed on lower extremities prior to induction with anesthesia.  He was then placed under general endotracheal anesthesia without complication.  His abdomen was then prepped and draped in standard sterile surgical fashion.  A surgical time-out was then performed.  I first infiltrated 0.25% Marcaine below his umbilicus.  I then made a vertical incision with a  #11 blade, this was carried out down to the fascia.  This was entered sharply and the peritoneum was entered bluntly.  A 0 Vicryl pursestring suture was placed through the fascia. The abdomen was then insufflated to 15 mmHg pressure.  I then placed 3 further 5 mm ports in the epigastrium and right upper quadrant under direct vision after infiltration with local anesthetic without complication.  I then noted that the gallbladder had adhesions to the omentum as well as the duodenum.  These were taken down with a combination of blunt and sharp dissection until the gallbladder was able to be retracted cephalad and lateral.  There was a significant amount of scarring in the triangle of Calot and it took me about 30 minutes to dissect out and identify the cystic duct and cystic artery.  I was able also to trace a common duct as it went towards the liver as well.  The cystic duct was very short, so I did not elect to perform a cholangiogram.  I clipped the artery 3 times and divided it.  After I had obtained a critical view of safety, I then clipped the cystic duct and divided this in a similar fashion.  The gallbladder was then removed from the liver bed with some difficulty due to its appearance that did not enter into  the gallbladder.  This was then placed in an EndoCatch bag and removed from the umbilicus.  Irrigation was performed until this was clear.  Hemostasis was observed.  I did place a piece of Surgicel snow in the liver bed just due to the fact that it was very raw.  I then evacuated all the irrigant.  The Hasson was removed.  I tied the stitch down and this obliterated the defect.  I reviewed with the scope and there was no evidence of an entry injury.  I then desufflated the abdomen and removed all trocars.  The incisions were closed with 4-0 Monocryl and Dermabond.  He tolerated this well, was extubated in the operating room, and transferred to recovery room in stable  condition.     Juanetta Gosling, MD     MCW/MEDQ  D:  04/17/2011  T:  04/18/2011  Job:  [id number]  cc:   Cassell Clement, M.D. Fax: [phone]  Electronically Signed by Emelia Loron MD on 04/19/2011 12:26:11 PM

## 2011-04-25 NOTE — H&P (Signed)
Paul [name], Paul [name] NO.:  [id number]  MEDICAL RECORD NO.:  [id number]  LOCATION:  1537                         FACILITY:  [facility]  PHYSICIAN:  Juanetta Gosling, MDDATE OF BIRTH:  Mar 07, 1946  DATE OF ADMISSION:  04/17/2011 DATE OF DISCHARGE:                             HISTORY & PHYSICAL   PRIMARY CARE PHYSICIAN:  Cassell Clement, M.D.  CHIEF COMPLAINT:  Abdominal pain.  HISTORY OF PRESENT ILLNESS:  Mr. Nebel is a 65 year old gentleman who was initially referred by [facility] to Dr. Zachery Dakins for abdominal pain and had been worked up as an outpatient.  He had an ultrasound that showed evidence of gallstones with mild wall thickening and evidence of at least acute cholecystitis of a chronic nature.  He was seen by Dr. Zachery Dakins yesterday, apparently was not having any acute significant abdominal pain but persistent mild discomfort.  The decision was made to admit the patient this morning as his symptoms again have been persistent and it was felt that he would benefit from more urgent cholecystectomy versus delayed.  Therefore, the patient has been sent over for admission in preparation for cholecystectomy.  PAST MEDICAL HISTORY:  Significant for hypertension, coronary artery disease status post CABG in 2005.  The patient has a rare exertional angina but has been followed by Dr. Patty Sermons.  He has had a recent stress test in December of 2011 which was not abnormal.  He also has a history of non-insulin-dependent diabetes mellitus, hyperlipidemia, gastroesophageal reflux disease and allergic rhinitis.  PAST SURGICAL HISTORY:  Coronary artery bypass graft in 2005.  He had right knee replacement in 2005, robotic prostatectomy in 2007.  FAMILY HISTORY:  Noncontributory to present case.  SOCIAL HISTORY:  No alcohol, tobacco or illicit drug use.  He is married, has two children.  REVIEW OF SYSTEMS:  Please see history present illness for  pertinent findings; otherwise, complete 12 systems review negative.  ALLERGIES:  Include PENICILLIN and YELLOW JACKET WASP.  CURRENT MEDICATIONS: 1. Toprol XL 50 mg daily. 2. Simvastatin 40 mg 1/2 tablet daily. 3. Aspirin 325 mg daily. 4. Flonase nasal spray daily. 5. Prilosec 40 mg daily, Glucophage 500 mg twice daily. 6. Amlodipine 5 mg daily. 7. Hyzaar 100/25 mg daily. 8. Multivitamin once daily. 9. Cialis 5 mg daily as needed.  PHYSICAL EXAMINATION:  GENERAL:  Reveals a 65 year old Caucasian gentleman who is not in any acute distress. VITAL SIGNS:  Current vital signs showed temperature of 98.7, heart rate of 80, respiratory rate of 18, blood pressure of 101/68.  Oxygen saturation 97% on room air. ENT:  Unremarkable. NECK:  Supple without lymphadenopathy.  Trachea is midline.  No thyromegaly, no masses. LUNGS:  Clear to auscultation.  No wheezes, rhonchi or rales.  Normal respiratory effort without use of accessory muscles. HEART:  Regular rate and rhythm.  S1, split S2.  No evidence of murmurs, gallops or rubs.  Carotids are 2+ and brisk and intact without asymmetry.  Peripheral pulses intact and symmetrical. ABDOMEN:  The abdomen is soft, nondistended.  Surgical scars are compatible with his history.  The patient is mildly tender in the right upper quadrant without evidence of rebound or peritonitis.  No organomegaly  is seen.  No mass effect or hernias. RECTAL EXAM:  Deferred. EXTREMITIES:  Good active range of motion in all extremities without crepitus or pain.  Normal muscle strength and tone without atrophy. SKIN:  Otherwise warm and dry with good turgor.  No rashes, lesions, nodules or jaundice. NEUROLOGIC:  The patient is alert and oriented x3.  DIAGNOSTICS:  CBC shows a white blood cell count of 6.2, hemoglobin of 12.7, hematocrit of 36.8, platelet count of 237.  Metabolic panel shows a sodium of 136, potassium of 4.1, chloride of 98, CO2 of 25, BUN of  29, creatinine of 1.6, glucose of 206.  Liver enzymes within normal limits.  IMAGING:  The ultrasound as mentioned in history of present illness shows stones, mild wall thickening consistent with at least chronic cholecystitis.  Stress test performed by Dr. Yevonne Pax office on October 17, 2010, shows no significant ischemic changes, normal motion and  projected ejection fraction at 60%.  This was also reviewed by his cardiologist at that time.  No additional suggestions were made.  IMPRESSION: 1. Abdominal pain secondary to acute on chronic cholecystitis. 2. Coronary artery disease - stable per Cardiology. 3. Hypertension. 4. Non-insulin-dependent diabetes mellitus - stable.  PLAN:  We will admit the patient, start him on IV fluid hydration, glucose coverage and plan for cholecystectomy during this hospitalization.     Brayton El, PA-C   ______________________________ Juanetta Gosling, MD    KB/MEDQ  D:  04/17/2011  T:  04/17/2011  Job:  [id number]  Electronically Signed by Brayton El  on 04/23/2011 02:44:50 PM Electronically Signed by Emelia Loron MD on 04/25/2011 08:20:35 PM

## 2011-05-08 ENCOUNTER — Encounter (INDEPENDENT_AMBULATORY_CARE_PROVIDER_SITE_OTHER): Payer: Self-pay | Admitting: General Surgery

## 2011-05-08 ENCOUNTER — Ambulatory Visit (INDEPENDENT_AMBULATORY_CARE_PROVIDER_SITE_OTHER): Payer: Managed Care, Other (non HMO) | Admitting: General Surgery

## 2011-05-08 DIAGNOSIS — K812 Acute cholecystitis with chronic cholecystitis: Secondary | ICD-10-CM | POA: Insufficient documentation

## 2011-05-08 NOTE — Progress Notes (Signed)
Subjective:     Patient ID: NEVEN FINA, male   DOB: [birth year], 65 y.o.   MRN: [id number]    There were no vitals taken for this visit.    HPI Mr. Radell is a 65 year old male who was admitted to [facility] on June 19. He was admitted for acute on chronic cholecystitis. He underwent a laparoscopic cholecystectomy for appear to be acute on chronic cholecystitis. He did well following this and was discharged home. His pathology showed chronic cholecystitis. He returns today doing well without any significant complaints. His energy is not yet returned to normal. He says he is having some occasional loose stools. He had some redness at a couple of his incisions it is likely just due to the Dermabond and some of the stitches that I had but there is no evidence of any infection at any of these. He is returning to most of his normal activity at this point without any difficulty.  Review of Systems     Objective:   Physical Exam Scabs at 5 mm port sites with some redness but I think is reaction to dermabond and no infection, umbilical incision healed well, soft, nontender    Assessment:     S/p lap chole     Plan:     Return to full activity Return to see me as needed

## 2011-05-08 NOTE — Patient Instructions (Signed)
Released to full activity.

## 2011-07-05 ENCOUNTER — Other Ambulatory Visit: Payer: Self-pay | Admitting: Cardiology

## 2011-07-05 DIAGNOSIS — Z8546 Personal history of malignant neoplasm of prostate: Secondary | ICD-10-CM

## 2011-07-05 DIAGNOSIS — E1169 Type 2 diabetes mellitus with other specified complication: Secondary | ICD-10-CM

## 2011-07-05 DIAGNOSIS — I119 Hypertensive heart disease without heart failure: Secondary | ICD-10-CM

## 2011-07-09 ENCOUNTER — Other Ambulatory Visit: Payer: Self-pay | Admitting: Cardiology

## 2011-07-09 ENCOUNTER — Other Ambulatory Visit (INDEPENDENT_AMBULATORY_CARE_PROVIDER_SITE_OTHER): Payer: Managed Care, Other (non HMO) | Admitting: *Deleted

## 2011-07-09 DIAGNOSIS — E785 Hyperlipidemia, unspecified: Secondary | ICD-10-CM

## 2011-07-09 DIAGNOSIS — E1169 Type 2 diabetes mellitus with other specified complication: Secondary | ICD-10-CM

## 2011-07-09 DIAGNOSIS — E119 Type 2 diabetes mellitus without complications: Secondary | ICD-10-CM

## 2011-07-09 DIAGNOSIS — Z8546 Personal history of malignant neoplasm of prostate: Secondary | ICD-10-CM

## 2011-07-09 DIAGNOSIS — I119 Hypertensive heart disease without heart failure: Secondary | ICD-10-CM

## 2011-07-09 LAB — CBC WITH DIFFERENTIAL/PLATELET
Basophils Absolute: 0 10*3/uL (ref 0.0–0.1)
Basophils Relative: 0.6 % (ref 0.0–3.0)
Eosinophils Absolute: 0.2 10*3/uL (ref 0.0–0.7)
Hemoglobin: 12.8 g/dL — ABNORMAL LOW (ref 13.0–17.0)
Lymphs Abs: 1.9 10*3/uL (ref 0.7–4.0)
MCHC: 33 g/dL (ref 30.0–36.0)
MCV: 91.7 fl (ref 78.0–100.0)
Monocytes Absolute: 0.6 10*3/uL (ref 0.1–1.0)
Neutro Abs: 1.9 10*3/uL (ref 1.4–7.7)
RBC: 4.24 Mil/uL (ref 4.22–5.81)
RDW: 14.4 % (ref 11.5–14.6)

## 2011-07-09 LAB — HEMOGLOBIN A1C: Hgb A1c MFr Bld: 7.2 % — ABNORMAL HIGH (ref 4.6–6.5)

## 2011-07-09 LAB — HEPATIC FUNCTION PANEL
Albumin: 4 g/dL (ref 3.5–5.2)
Alkaline Phosphatase: 40 U/L (ref 39–117)
Total Protein: 7.2 g/dL (ref 6.0–8.3)

## 2011-07-09 LAB — BASIC METABOLIC PANEL
BUN: 22 mg/dL (ref 6–23)
Chloride: 103 mEq/L (ref 96–112)
Creatinine, Ser: 1.4 mg/dL (ref 0.4–1.5)
GFR: 53.09 mL/min — ABNORMAL LOW (ref >60.00)
Potassium: 3.9 mEq/L (ref 3.5–5.1)

## 2011-07-09 LAB — LIPID PANEL
Cholesterol: 142 mg/dL (ref 0–200)
Triglycerides: 139 mg/dL (ref 0.0–149.0)

## 2011-07-10 ENCOUNTER — Emergency Department (HOSPITAL_COMMUNITY)
Admission: EM | Admit: 2011-07-10 | Discharge: 2011-07-10 | Disposition: A | Discharge status: Home / Self Care | Payer: Managed Care, Other (non HMO) | Attending: Emergency Medicine | Admitting: Emergency Medicine

## 2011-07-10 ENCOUNTER — Emergency Department (HOSPITAL_COMMUNITY): Payer: Managed Care, Other (non HMO)

## 2011-07-10 DIAGNOSIS — R109 Unspecified abdominal pain: Secondary | ICD-10-CM | POA: Insufficient documentation

## 2011-07-10 DIAGNOSIS — K5730 Diverticulosis of large intestine without perforation or abscess without bleeding: Secondary | ICD-10-CM | POA: Insufficient documentation

## 2011-07-10 DIAGNOSIS — Z951 Presence of aortocoronary bypass graft: Secondary | ICD-10-CM | POA: Insufficient documentation

## 2011-07-10 DIAGNOSIS — Z8546 Personal history of malignant neoplasm of prostate: Secondary | ICD-10-CM | POA: Insufficient documentation

## 2011-07-10 DIAGNOSIS — N201 Calculus of ureter: Secondary | ICD-10-CM | POA: Insufficient documentation

## 2011-07-10 DIAGNOSIS — E119 Type 2 diabetes mellitus without complications: Secondary | ICD-10-CM | POA: Insufficient documentation

## 2011-07-10 DIAGNOSIS — I252 Old myocardial infarction: Secondary | ICD-10-CM | POA: Insufficient documentation

## 2011-07-10 DIAGNOSIS — I10 Essential (primary) hypertension: Secondary | ICD-10-CM | POA: Insufficient documentation

## 2011-07-10 LAB — URINALYSIS, ROUTINE W REFLEX MICROSCOPIC
Nitrite: NEGATIVE
Protein, ur: 100 mg/dL — AB
Urobilinogen, UA: 0.2 mg/dL (ref 0.0–1.0)

## 2011-07-10 LAB — BASIC METABOLIC PANEL
CO2: 22 mEq/L (ref 19–32)
Calcium: 10.1 mg/dL (ref 8.4–10.5)
Creatinine, Ser: 1.41 mg/dL — ABNORMAL HIGH (ref 0.50–1.35)
Glucose, Bld: 148 mg/dL — ABNORMAL HIGH (ref 70–99)

## 2011-07-10 LAB — CBC
HCT: 36.3 % — ABNORMAL LOW (ref 39.0–52.0)
Hemoglobin: 12.7 g/dL — ABNORMAL LOW (ref 13.0–17.0)
MCH: 30 pg (ref 26.0–34.0)
MCHC: 35 g/dL (ref 30.0–36.0)
MCV: 85.6 fL (ref 78.0–100.0)
Platelets: 182 10*3/uL (ref 150–400)
RBC: 4.24 MIL/uL (ref 4.22–5.81)
RDW: 13.1 % (ref 11.5–15.5)
WBC: 7.3 K/uL (ref 4.0–10.5)

## 2011-07-10 LAB — URINE MICROSCOPIC-ADD ON

## 2011-07-10 LAB — DIFFERENTIAL
Basophils Absolute: 0 K/uL (ref 0.0–0.1)
Basophils Relative: 0 % (ref 0–1)
Eosinophils Absolute: 0.1 10*3/uL (ref 0.0–0.7)
Eosinophils Relative: 1 % (ref 0–5)
Lymphocytes Relative: 21 % (ref 12–46)
Lymphs Abs: 1.5 10*3/uL (ref 0.7–4.0)
Monocytes Absolute: 0.6 10*3/uL (ref 0.1–1.0)
Monocytes Relative: 8 % (ref 3–12)
Neutro Abs: 5 K/uL (ref 1.7–7.7)
Neutrophils Relative %: 69 % (ref 43–77)

## 2011-07-10 LAB — BASIC METABOLIC PANEL WITH GFR
BUN: 24 mg/dL — ABNORMAL HIGH (ref 6–23)
Chloride: 98 meq/L (ref 96–112)
GFR calc Af Amer: >60 mL/min (ref >60)
GFR calc non Af Amer: 50 mL/min — ABNORMAL LOW (ref >60)
Potassium: 3.6 meq/L (ref 3.5–5.1)
Sodium: 135 meq/L (ref 135–145)

## 2011-07-11 ENCOUNTER — Encounter: Payer: Self-pay | Admitting: Cardiology

## 2011-07-11 ENCOUNTER — Ambulatory Visit (INDEPENDENT_AMBULATORY_CARE_PROVIDER_SITE_OTHER): Payer: Managed Care, Other (non HMO) | Admitting: Cardiology

## 2011-07-11 VITALS — BP 120/80 | HR 80 | Wt 217.0 lb

## 2011-07-11 DIAGNOSIS — E119 Type 2 diabetes mellitus without complications: Secondary | ICD-10-CM

## 2011-07-11 DIAGNOSIS — N23 Unspecified renal colic: Secondary | ICD-10-CM | POA: Insufficient documentation

## 2011-07-11 DIAGNOSIS — Z951 Presence of aortocoronary bypass graft: Secondary | ICD-10-CM

## 2011-07-11 DIAGNOSIS — E1169 Type 2 diabetes mellitus with other specified complication: Secondary | ICD-10-CM

## 2011-07-11 DIAGNOSIS — E785 Hyperlipidemia, unspecified: Secondary | ICD-10-CM

## 2011-07-11 DIAGNOSIS — Z9089 Acquired absence of other organs: Secondary | ICD-10-CM

## 2011-07-11 DIAGNOSIS — Z9049 Acquired absence of other specified parts of digestive tract: Secondary | ICD-10-CM | POA: Insufficient documentation

## 2011-07-11 DIAGNOSIS — E7800 Pure hypercholesterolemia, unspecified: Secondary | ICD-10-CM

## 2011-07-11 DIAGNOSIS — Z9889 Other specified postprocedural states: Secondary | ICD-10-CM

## 2011-07-11 LAB — URINE CULTURE
Colony Count: NO GROWTH
Culture  Setup Time: 201209111417
Culture: NO GROWTH

## 2011-07-11 NOTE — Progress Notes (Signed)
Paul [name] [facility] Date of Birth:  Apr 04, 1946 [facility] [address]   [address] [place], [place]  [address] [phone]           Fax   [phone]  History of Present Illness: This pleasant 64 year old gentleman is seen for a scheduled 4 month followup office visit.  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  His ejection fraction is normal at 60%.  Patient also has a history of essential hypertension and a history of dyslipidemia and diabetes mellitus and exogenous obesity.  Over the course of the summer he has had a cholecystectomy by Dr. Cleophas Dunker in June 2012 and earlier this week he was seen in the emergency room with right flank pain from kidney stones.  He is in the process of passing 2 small kidney stones.  In the emergency room he was placed on Cipro.  He has an appointment to see his urologist Dr. Laverle Patter later this month  Current Outpatient Prescriptions  Medication Sig Dispense Refill  . amLODipine (NORVASC) 5 MG tablet Take 5 mg by mouth daily.        Marland Kitchen aspirin 325 MG tablet Take 325 mg by mouth daily.        . citalopram (CELEXA) 20 MG tablet Take 1 tablet (20 mg total) by mouth daily.  30 tablet  12  . fluticasone (FLONASE) 50 MCG/ACT nasal spray 2 sprays by Nasal route daily.        . GuaiFENesin (MUCINEX PO) Take by mouth. As needed       . losartan-hydrochlorothiazide (HYZAAR) 100-25 MG per tablet Take 1 tablet by mouth daily.        . metFORMIN (GLUCOPHAGE) 500 MG tablet Take 1,000 mg by mouth 2 (two) times daily with a meal. Taking 1500 mg daily       . metoprolol (TOPROL-XL) 50 MG 24 hr tablet Take 1 tablet (50 mg total) by mouth daily.  30 tablet  12  . Multiple Vitamin (MULTIVITAMIN) tablet Take 1 tablet by mouth daily.        . Omega-3 Fatty Acids (FISH  OIL) 1000 MG CAPS Take by mouth daily.        Marland Kitchen omeprazole (PRILOSEC) 40 MG capsule Take 40 mg by mouth daily.        . simvastatin (ZOCOR) 40 MG tablet Take 40 mg by mouth at bedtime. Taking 1/2 daily       . tadalafil (CIALIS) 5 MG tablet Take 5 mg by mouth daily as needed. Per Dr. Laverle Patter         Allergies  Allergen Reactions  . Percocet (Oxycodone-Acetaminophen)     nightmares  . Xanax     No good    Patient Active Problem List  Diagnoses  . Hx of CABG  . Diabetes mellitus  . Benign hypertensive heart disease without heart failure  . Dyslipidemia associated with type 2 diabetes mellitus  . History of prostate cancer  . Cholecystitis chronic, acute    History  Smoking status  . Never Smoker   Smokeless tobacco  . Not on file    History  Alcohol Use No    No family history on file.  Review of Systems: Constitutional: no fever chills diaphoresis or fatigue or change in weight.  Head and  neck: no hearing loss, no epistaxis, no photophobia or visual disturbance. Respiratory: No cough, shortness of breath or wheezing. Cardiovascular: No chest pain peripheral edema, palpitations. Gastrointestinal: No abdominal distention, no abdominal pain, no change in bowel habits hematochezia or melena. Genitourinary: No dysuria, no frequency, no urgency, no nocturia. Musculoskeletal:No arthralgias, no back pain, no gait disturbance or myalgias. Neurological: No dizziness, no headaches, no numbness, no seizures, no syncope, no weakness, no tremors. Hematologic: No lymphadenopathy, no easy bruising. Psychiatric: No confusion, no hallucinations, no sleep disturbance.    Physical Exam: Filed Vitals:   07/11/11 1014  BP: 120/80  Pulse: 80  The general appearance reveals a well-developed well-nourished gentleman in no distress.Pupils equal and reactive.   Extraocular Movements are full.  There is no scleral icterus.  The mouth and pharynx are normal.  The neck is supple.  The  carotids reveal no bruits.  The jugular venous pressure is normal.  The thyroid is not enlarged.  There is no lymphadenopathy.  The chest is clear to percussion and auscultation. There are no rales or rhonchi. Expansion of the chest is symmetrical.  The precordium is quiet.  The first heart sound is normal.  The second heart sound is physiologically split.  There is no murmur gallop rub or click.  There is no abnormal lift or heave.  The abdomen is soft and nontender. Bowel sounds are normal. The liver and spleen are not enlarged. There Are no abdominal masses. There are no bruits.  The pedal pulses are good.  There is no phlebitis or edema.  There is no cyanosis or clubbing.  Strength is normal and symmetrical in all extremities.  There is no lateralizing weakness.  There are no sensory deficits.  The skin is warm and dry.  There is no rash.     Assessment / Plan: Continue same medication.  Recheck in 4 months for followup office visit and lab work.

## 2011-07-11 NOTE — Assessment & Plan Note (Signed)
The patient has not had any recurrent chest pain to suggest angina pectoris. 

## 2011-07-11 NOTE — Assessment & Plan Note (Signed)
The patient was seen in the emergency room night before last with nausea and flank pain and was found to have on CT scan 2 small kidney stones which she is in the process of passing.  He's having moderate discomfort.  He did not require anything stronger than ibuprofen.  He has an appointment to see Dr. Laverle Patter his urologist on September 25.  The patient is on Cipro from the emergency room.  The patient is also being worked up for low testosterone by Dr. Laverle Patter

## 2011-07-11 NOTE — Assessment & Plan Note (Signed)
The patient had a successful laparoscopic cholecystectomy by Dr. Dwain Sarna in June 2012.  He's not having any subsequent problems from his gallbladder area.

## 2011-07-11 NOTE — Assessment & Plan Note (Signed)
The patient has not been experiencing any symptoms of hypoglycemia.

## 2011-07-11 NOTE — Assessment & Plan Note (Signed)
Patient has a history of dyslipidemia and is on simvastatin 20 mg daily.He's not having any side effects from the simvastatin in terms of myalgias.

## 2011-11-20 ENCOUNTER — Other Ambulatory Visit: Payer: Self-pay | Admitting: Cardiology

## 2011-11-21 ENCOUNTER — Other Ambulatory Visit: Payer: Self-pay | Admitting: Cardiology

## 2011-11-21 NOTE — Telephone Encounter (Signed)
Refilled amlodipine 

## 2011-12-25 ENCOUNTER — Other Ambulatory Visit: Payer: Self-pay | Admitting: Cardiology

## 2011-12-26 NOTE — Telephone Encounter (Signed)
Refilled simvastatin.

## 2012-01-15 ENCOUNTER — Other Ambulatory Visit: Payer: Self-pay | Admitting: Otolaryngology

## 2012-01-15 ENCOUNTER — Ambulatory Visit
Admission: RE | Admit: 2012-01-15 | Discharge: 2012-01-15 | Disposition: A | Discharge status: Home / Self Care | Payer: Managed Care, Other (non HMO) | Source: Ambulatory Visit | Attending: Otolaryngology | Admitting: Otolaryngology

## 2012-01-15 DIAGNOSIS — J189 Pneumonia, unspecified organism: Secondary | ICD-10-CM

## 2012-03-10 ENCOUNTER — Other Ambulatory Visit: Payer: Self-pay | Admitting: Cardiology

## 2012-03-21 ENCOUNTER — Other Ambulatory Visit: Payer: Self-pay | Admitting: Cardiology

## 2012-03-21 NOTE — Telephone Encounter (Signed)
Refilled metoprolol 

## 2012-04-08 ENCOUNTER — Other Ambulatory Visit: Payer: Self-pay | Admitting: Cardiology

## 2012-04-08 NOTE — Telephone Encounter (Signed)
..   Requested Prescriptions   Signed Prescriptions Disp Refills  . citalopram (CELEXA) 20 MG tablet 30 tablet 6    Sig: TAKE ONE TABLET BY MOUTH DAILY    Authorizing Provider: Cassell Clement    Ordering User: Lacie Scotts  . metFORMIN (GLUCOPHAGE) 500 MG tablet 90 tablet 4    Sig: TAKE TWO TABLETS BY MOUTH IN THE MORNING AND ONE IN THE EVENING    Authorizing Provider: Cassell Clement    Ordering User: Lacie Scotts

## 2012-05-19 ENCOUNTER — Other Ambulatory Visit (INDEPENDENT_AMBULATORY_CARE_PROVIDER_SITE_OTHER): Payer: Managed Care, Other (non HMO)

## 2012-05-19 DIAGNOSIS — E7800 Pure hypercholesterolemia, unspecified: Secondary | ICD-10-CM

## 2012-05-19 DIAGNOSIS — Z8546 Personal history of malignant neoplasm of prostate: Secondary | ICD-10-CM

## 2012-05-19 DIAGNOSIS — I119 Hypertensive heart disease without heart failure: Secondary | ICD-10-CM

## 2012-05-19 DIAGNOSIS — E785 Hyperlipidemia, unspecified: Secondary | ICD-10-CM

## 2012-05-19 DIAGNOSIS — E1169 Type 2 diabetes mellitus with other specified complication: Secondary | ICD-10-CM

## 2012-05-19 LAB — BASIC METABOLIC PANEL
CO2: 27 mEq/L (ref 19–32)
Calcium: 9.9 mg/dL (ref 8.4–10.5)
Chloride: 99 mEq/L (ref 96–112)
Glucose, Bld: 156 mg/dL — ABNORMAL HIGH (ref 70–99)
Sodium: 138 mEq/L (ref 135–145)

## 2012-05-19 LAB — HEPATIC FUNCTION PANEL
AST: 50 U/L — ABNORMAL HIGH (ref 0–37)
Albumin: 4 g/dL (ref 3.5–5.2)
Total Bilirubin: 0.8 mg/dL (ref 0.3–1.2)

## 2012-05-19 LAB — LIPID PANEL
Cholesterol: 164 mg/dL (ref 0–200)
Total CHOL/HDL Ratio: 5
Triglycerides: 228 mg/dL — ABNORMAL HIGH (ref 0.0–149.0)
VLDL: 45.6 mg/dL — ABNORMAL HIGH (ref 0.0–40.0)

## 2012-05-19 IMAGING — CR DG CHEST 2V
2 series · 2 of 2 positions shown · non-contrast
Comparison: Portable chest x-ray of 09/12/2006

CLINICAL DATA: Cough, congestion

CHEST - 2 VIEW

[w chest pa]
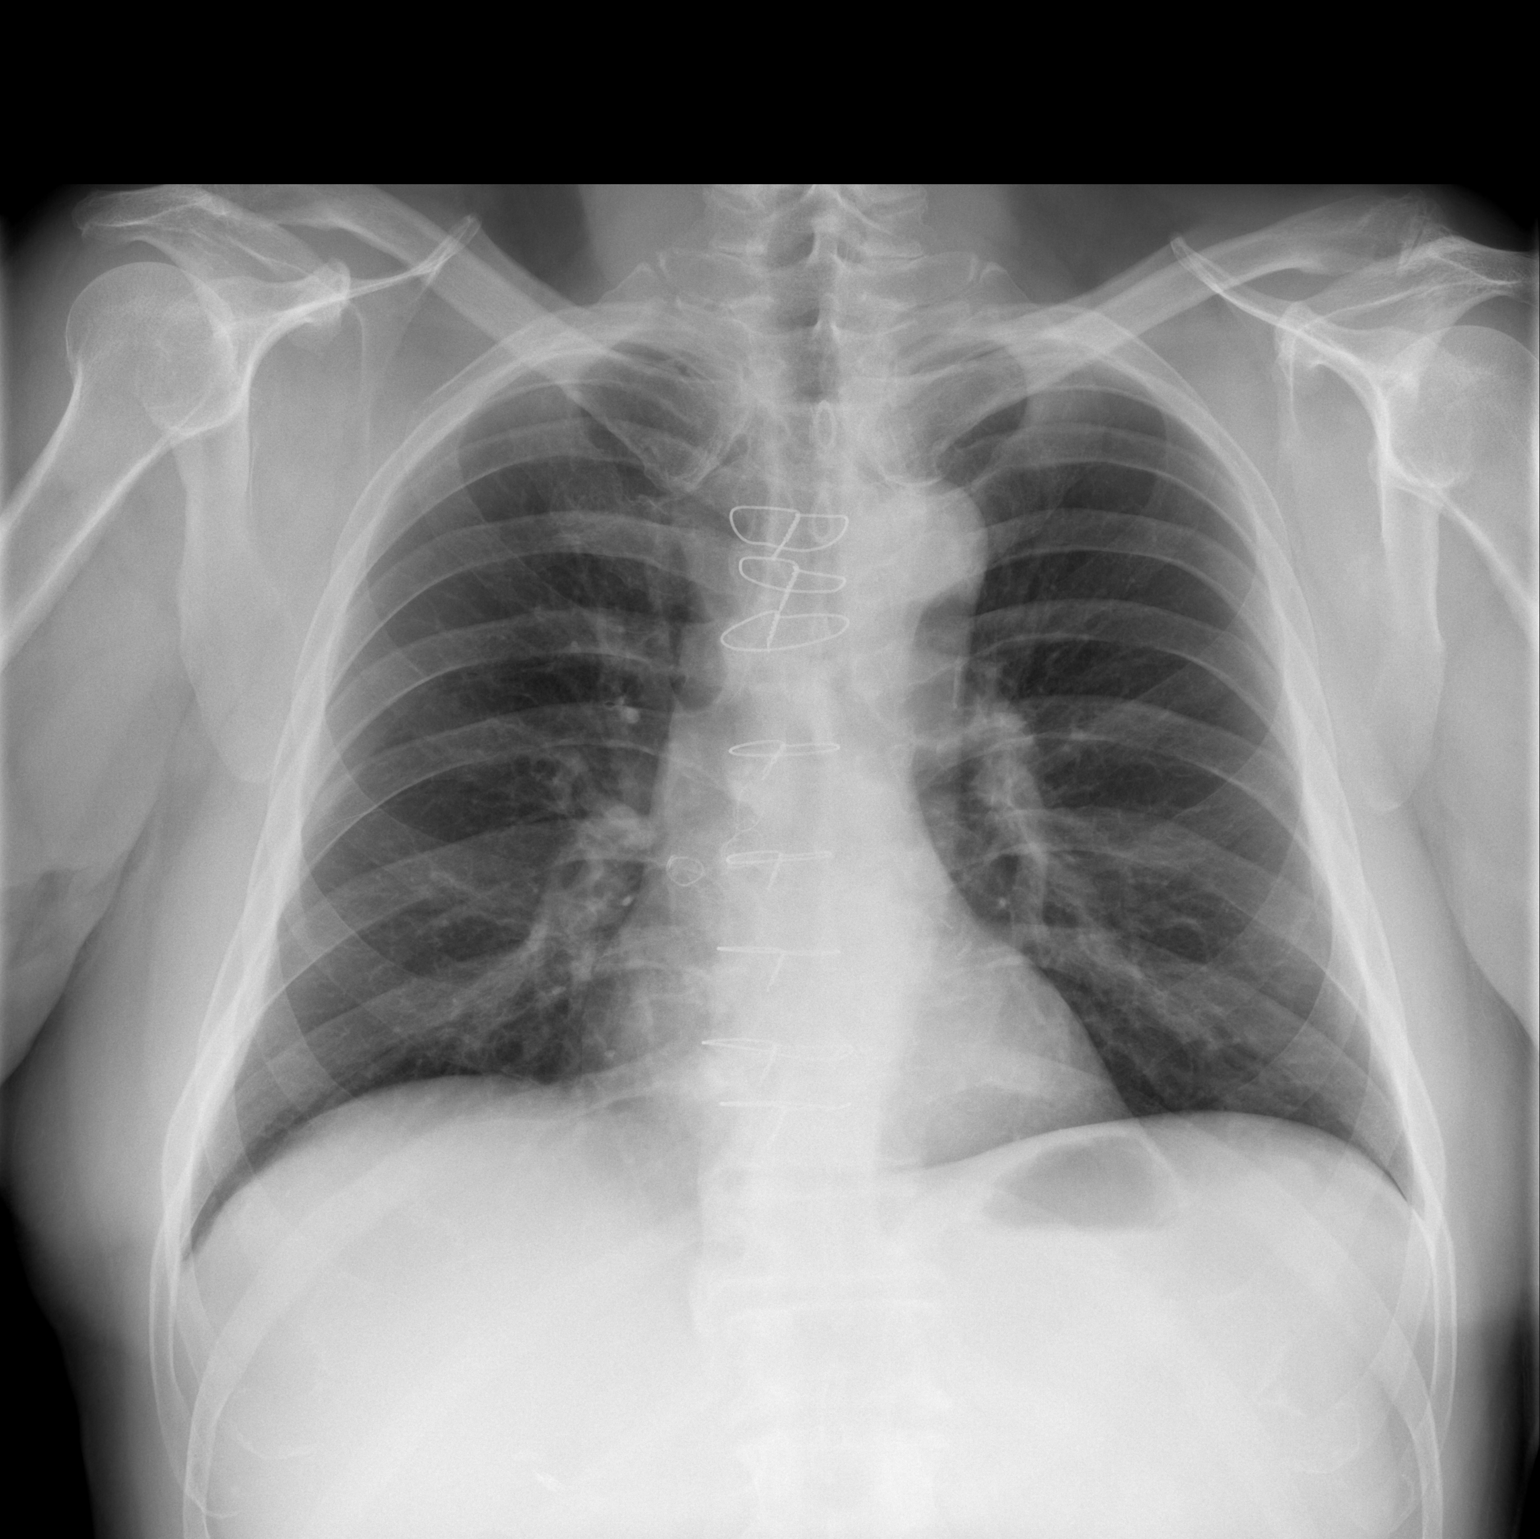

[w chest lat]
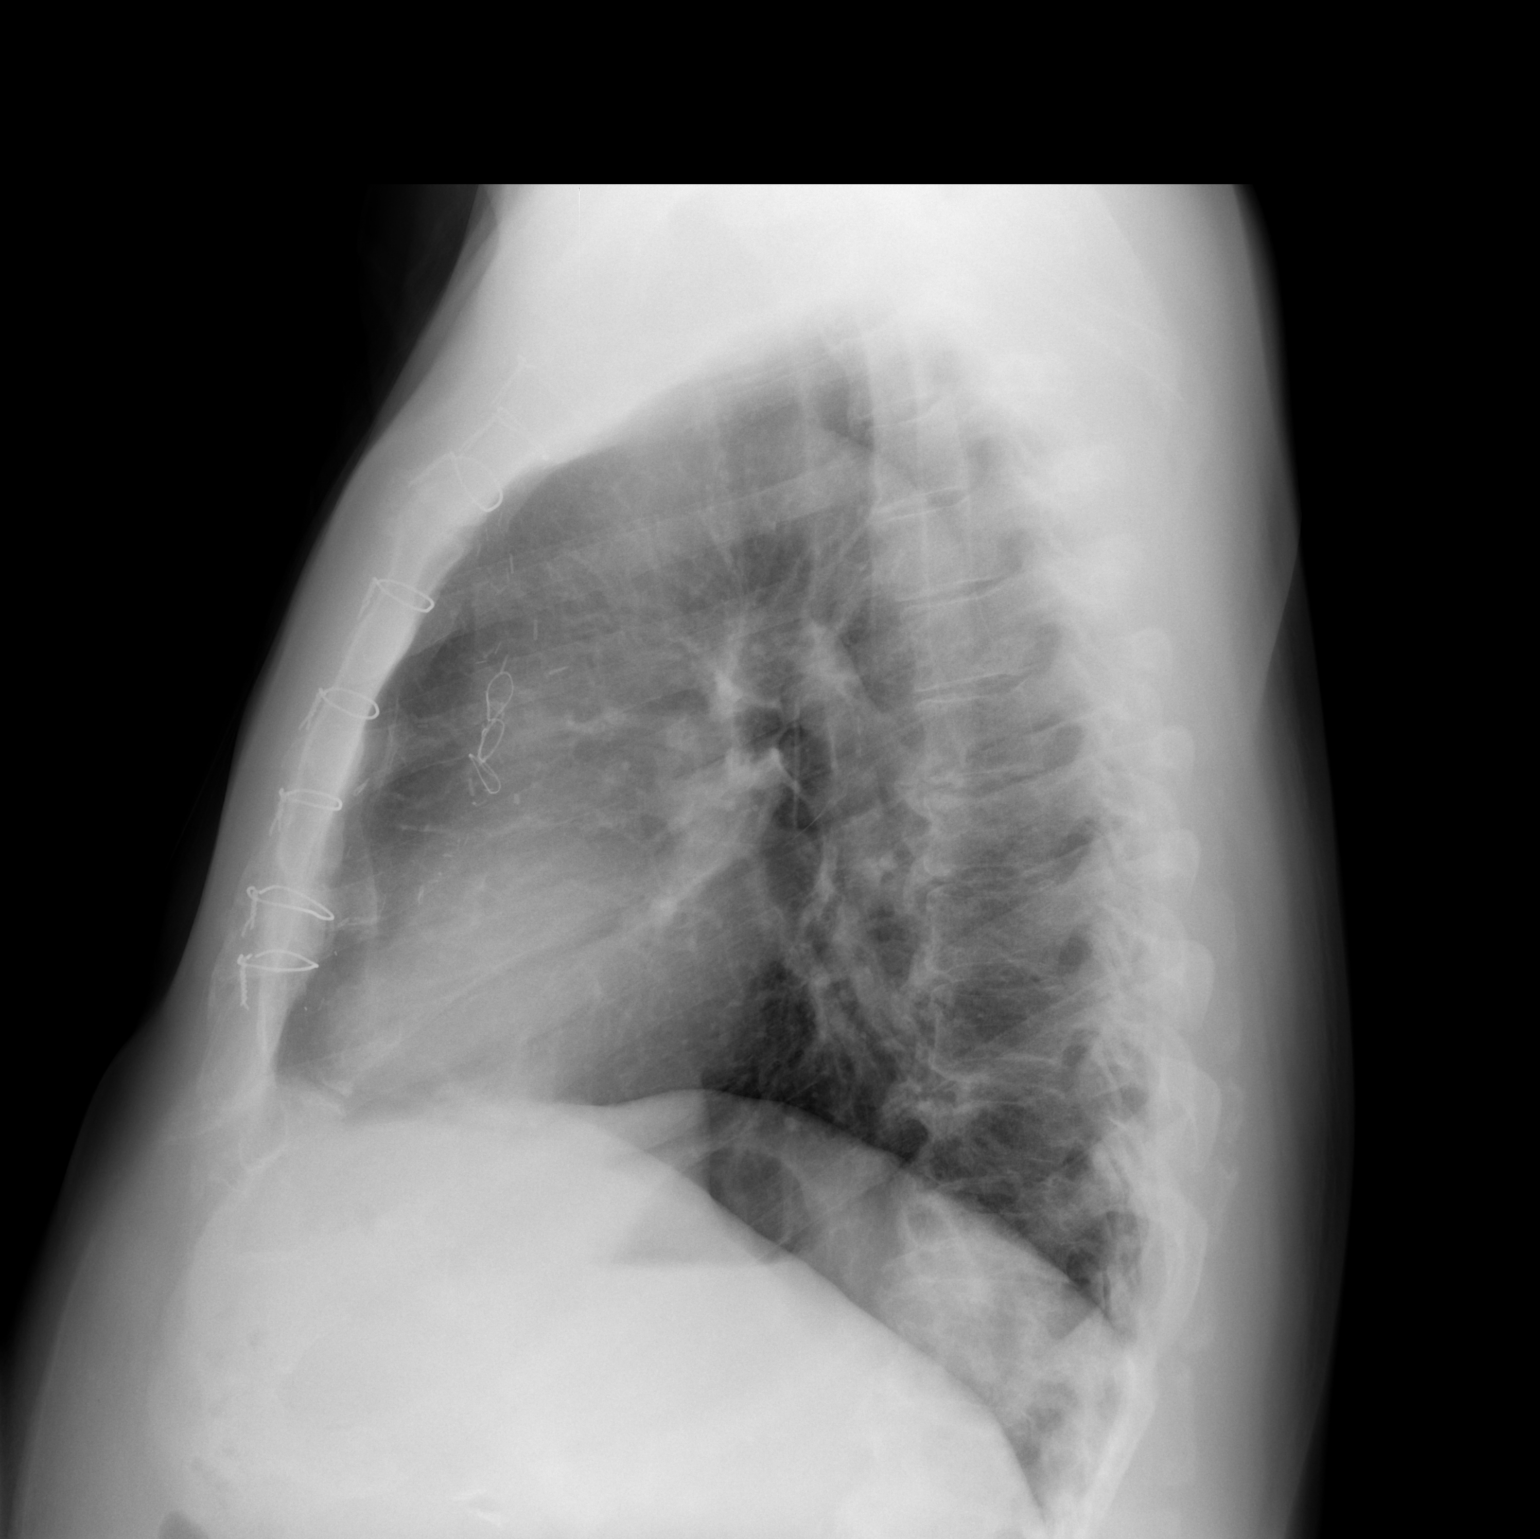

[2 of 2 positions shown; findings below may reference images not displayed]

FINDINGS: The lungs are clear.  Mediastinal contours appear normal.
The heart is within normal limits in size.  Median sternotomy
sutures are noted from prior CABG.  No acute bony abnormalities
seen with some degenerative change throughout the mid to lower
thoracic spine.
IMPRESSION: No active lung disease.

## 2012-05-19 NOTE — Progress Notes (Signed)
Quick Note:  Please make copy of labs for patient visit. ______ 

## 2012-05-22 ENCOUNTER — Ambulatory Visit (INDEPENDENT_AMBULATORY_CARE_PROVIDER_SITE_OTHER): Payer: Managed Care, Other (non HMO) | Admitting: Cardiology

## 2012-05-22 ENCOUNTER — Encounter: Payer: Self-pay | Admitting: Cardiology

## 2012-05-22 VITALS — BP 130/84 | HR 72 | Ht 68.0 in | Wt 219.0 lb

## 2012-05-22 DIAGNOSIS — E1169 Type 2 diabetes mellitus with other specified complication: Secondary | ICD-10-CM

## 2012-05-22 DIAGNOSIS — E1143 Type 2 diabetes mellitus with diabetic autonomic (poly)neuropathy: Secondary | ICD-10-CM

## 2012-05-22 DIAGNOSIS — E1149 Type 2 diabetes mellitus with other diabetic neurological complication: Secondary | ICD-10-CM

## 2012-05-22 DIAGNOSIS — G909 Disorder of the autonomic nervous system, unspecified: Secondary | ICD-10-CM

## 2012-05-22 DIAGNOSIS — Z951 Presence of aortocoronary bypass graft: Secondary | ICD-10-CM

## 2012-05-22 DIAGNOSIS — E1142 Type 2 diabetes mellitus with diabetic polyneuropathy: Secondary | ICD-10-CM

## 2012-05-22 DIAGNOSIS — J309 Allergic rhinitis, unspecified: Secondary | ICD-10-CM

## 2012-05-22 DIAGNOSIS — I119 Hypertensive heart disease without heart failure: Secondary | ICD-10-CM

## 2012-05-22 DIAGNOSIS — E785 Hyperlipidemia, unspecified: Secondary | ICD-10-CM

## 2012-05-22 DIAGNOSIS — R079 Chest pain, unspecified: Secondary | ICD-10-CM

## 2012-05-22 DIAGNOSIS — J302 Other seasonal allergic rhinitis: Secondary | ICD-10-CM

## 2012-05-22 MED ORDER — METFORMIN HCL 500 MG PO TABS: ORAL_TABLET | ORAL | Status: DC

## 2012-05-22 MED ORDER — METOPROLOL SUCCINATE ER 50 MG PO TB24: 50.0000 mg | ORAL_TABLET | Freq: Every day | ORAL | Status: DC

## 2012-05-22 MED ORDER — EPINEPHRINE 0.3 MG/0.3ML IJ DEVI: 0.3000 mg | Freq: Once | INTRAMUSCULAR | Status: DC

## 2012-05-22 MED ORDER — LOSARTAN POTASSIUM-HCTZ 100-25 MG PO TABS: 1.0000 | ORAL_TABLET | Freq: Every day | ORAL | Status: DC

## 2012-05-22 MED ORDER — FLUTICASONE PROPIONATE 50 MCG/ACT NA SUSP: 2.0000 | Freq: Every day | NASAL | Status: DC

## 2012-05-22 NOTE — Assessment & Plan Note (Signed)
The patient has a past history of coronary artery bypass graft surgery in 2005.  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  Recently the patient has been experiencing exertional chest tightness which has increased in frequency.  It is relieved by rest.  He has nitroglycerin on hand but has not had to use it because the discomfort goes away quickly.  He has also been more short of breath if he has to carry objects up a flight of stairs.  He complains of unusual fatigue.  Because of his fatigue he has not been exercising and he is out of shape and he feels too tired to walk.  His weight is up 2 pounds.  We are going to have him return for a treadmill Myoview stress test

## 2012-05-22 NOTE — Assessment & Plan Note (Signed)
History do it here to a careful diet.  His cholesterol is satisfactory but his HDL is low and his triglycerides are high.  This is reflective of his weight gain and poorly controlled diabetes.  He will continue with simvastatin.

## 2012-05-22 NOTE — Progress Notes (Signed)
Paul [name] [facility] Date of Birth:  [birth year] [name] [address] [place], [place]  [address] [phone]         Fax   [phone]  History of Present Illness: This pleasant 66 year old gentleman is seen for a scheduled followup office visit.  He has a history of diabetes mellitus, any heart disease with prior coronary bypass graft surgery, essential hypertension, and hypercholesterolemia.  He is status post cholecystectomy.  He also has a past history of prostate cancer in the past history of kidney stones.  Dr. Laverle Patter is his urologist. Current Outpatient Prescriptions  Medication Sig Dispense Refill  . amLODipine (NORVASC) 5 MG tablet TAKE ONE TABLET BY MOUTH EVERY DAY  30 tablet  11  . aspirin 325 MG tablet Take 325 mg by mouth daily.        . citalopram (CELEXA) 20 MG tablet TAKE ONE TABLET BY MOUTH DAILY  30 tablet  6  . fluticasone (FLONASE) 50 MCG/ACT nasal spray Place 2 sprays into the nose daily.  16 g  PRN  . GuaiFENesin (MUCINEX PO) Take by mouth. As needed       . losartan-hydrochlorothiazide (HYZAAR) 100-25 MG per tablet Take 1 tablet by mouth daily.  90 each  3  . metFORMIN (GLUCOPHAGE) 500 MG tablet 2 IN THE MORNING AND 1 IN THE EVENING  270 tablet  3  . metoprolol succinate (TOPROL-XL) 50 MG 24 hr tablet Take 1 tablet (50 mg total) by mouth daily. Take with or immediately following a meal.  90 tablet  3  . Multiple Vitamin (MULTIVITAMIN) tablet Take 1 tablet by mouth daily.        . Omega-3 Fatty Acids (FISH OIL) 1000 MG CAPS Take by mouth daily.        Marland Kitchen omeprazole (PRILOSEC) 40 MG capsule Take 40 mg by mouth daily.        . simvastatin (ZOCOR) 40 MG tablet       . DISCONTD: fluticasone (FLONASE) 50 MCG/ACT nasal spray USE TWO SPRAYS IN EACH NOSTRIL EVERY DAY.  16 g  PRN  . DISCONTD: fluticasone (FLONASE) 50 MCG/ACT nasal spray       . DISCONTD: HYZAAR 100-25 MG per tablet TAKE ONE TABLET BY MOUTH EVERY DAY  30 each  4  . DISCONTD: metFORMIN  (GLUCOPHAGE) 500 MG tablet TAKE TWO TABLETS BY MOUTH IN THE MORNING AND ONE IN THE EVENING  90 tablet  4  . DISCONTD: metoprolol succinate (TOPROL-XL) 50 MG 24 hr tablet TAKE ONE TABLET BY MOUTH DAILY  30 tablet  5  . DISCONTD: simvastatin (ZOCOR) 40 MG tablet AS DIRECTED  90 tablet  3  . EPINEPHrine (EPIPEN) 0.3 mg/0.3 mL DEVI Inject 0.3 mLs (0.3 mg total) into the muscle once.  1 Device  PRN  . DISCONTD: metFORMIN (GLUCOPHAGE) 500 MG tablet Take 1,000 mg by mouth 2 (two) times daily with a meal. Taking 1500 mg daily         Allergies  Allergen Reactions  . Alprazolam     No good  . Percocet (Oxycodone-Acetaminophen)     nightmares  . Septra Ds (Sulfamethoxazole W-Trimethoprim)     rash    Patient Active Problem List  Diagnosis  . Hx of CABG  . Diabetes mellitus  . Benign hypertensive heart disease without heart failure  . Dyslipidemia associated with type 2 diabetes mellitus  . History of prostate cancer  . Cholecystitis chronic, acute  . S/P cholecystectomy  . Renal  colic    History  Smoking status  . Never Smoker   Smokeless tobacco  . Not on file    History  Alcohol Use No    No family history on file.  Review of Systems: Constitutional: no fever chills diaphoresis or fatigue or change in weight.  Head and neck: no hearing loss, no epistaxis, no photophobia or visual disturbance. Respiratory: No cough, shortness of breath or wheezing. Cardiovascular: No chest pain peripheral edema, palpitations. Gastrointestinal: No abdominal distention, no abdominal pain, no change in bowel habits hematochezia or melena. Genitourinary: No dysuria, no frequency, no urgency, no nocturia. Musculoskeletal:No arthralgias, no back pain, no gait disturbance or myalgias. Neurological: No dizziness, no headaches, no numbness, no seizures, no syncope, no weakness, no tremors. Hematologic: No lymphadenopathy, no easy bruising. Psychiatric: No confusion, no hallucinations, no sleep  disturbance.    Physical Exam: Filed Vitals:   05/22/12 0935  BP: 130/84  Pulse: 72   the general appearance reveals a well-developed somewhat overweight gentleman in no acute distress.Pupils equal and reactive.   Extraocular Movements are full.  There is no scleral icterus.  The mouth and pharynx are normal.  The neck is supple.  The carotids reveal no bruits.  The jugular venous pressure is normal.  The thyroid is not enlarged.  There is no lymphadenopathy.  The chest is clear to percussion and auscultation. There are no rales or rhonchi. Expansion of the chest is symmetrical.  The precordium is quiet.  The first heart sound is normal.  The second heart sound is physiologically split.  There is no murmur gallop rub or click.  There is no abnormal lift or heave.  The abdomen is soft and nontender. Bowel sounds are normal. The liver and spleen are not enlarged. There Are no abdominal masses. There are no bruits.  The pedal pulses are good.  There is no phlebitis or edema.  There is no cyanosis or clubbing. Strength is normal and symmetrical in all extremities.  There is no lateralizing weakness.  There are no sensory deficits.  EKG today shows sinus rhythm and nonspecific T-wave abnormalities.   Assessment / Plan: The patient is to continue same medications.  We will refer to Dr. Sharl Ma for following his diabetes.  He will return for a treadmill Myoview soon. The patient has been complaining of some dizziness when he stands up and I checked his blood pressure standing and it does drop to 106/70.  He will obtain some support hose and he will avoid quick changes of position.  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  He denies any pins and needles sensation in his feet however. Recheck here in 4 months for followup office visit CBC and fasting lipid panel, hepatic function panel and basal metabolic panel

## 2012-05-22 NOTE — Patient Instructions (Signed)
Will call and get you an appointment with Dr Sharl Ma, will call you with date and time  Your physician has requested that you have en exercise stress myoview. For further information please visit [email or web address] Please follow instruction sheet, as given.  Your physician recommends that you continue on your current medications as directed. Please refer to the Current Medication list given to you today.  Your physician recommends that you schedule a follow-up appointment in: 4 months with fasting labs (lp/bmet/hfp/cbc)

## 2012-05-22 NOTE — Assessment & Plan Note (Signed)
The patient has diabetes mellitus.  He is on metformin.  He has not been having any hypo-glycemic episodes.  His hemoglobin A1c has worsened to 8.1 he has not been seen by a diabetologist but I would like him to see a diabetologist and we will refer him to Dr. Sharl Ma.

## 2012-05-28 ENCOUNTER — Encounter (HOSPITAL_COMMUNITY): Payer: Managed Care, Other (non HMO)

## 2012-06-03 ENCOUNTER — Ambulatory Visit (HOSPITAL_COMMUNITY): Payer: Managed Care, Other (non HMO) | Attending: Cardiology | Admitting: Radiology

## 2012-06-03 VITALS — BP 127/83 | HR 63 | Ht 68.0 in | Wt 217.0 lb

## 2012-06-03 DIAGNOSIS — E119 Type 2 diabetes mellitus without complications: Secondary | ICD-10-CM | POA: Insufficient documentation

## 2012-06-03 DIAGNOSIS — R0609 Other forms of dyspnea: Secondary | ICD-10-CM | POA: Insufficient documentation

## 2012-06-03 DIAGNOSIS — E669 Obesity, unspecified: Secondary | ICD-10-CM | POA: Insufficient documentation

## 2012-06-03 DIAGNOSIS — R0602 Shortness of breath: Secondary | ICD-10-CM

## 2012-06-03 DIAGNOSIS — I2510 Atherosclerotic heart disease of native coronary artery without angina pectoris: Secondary | ICD-10-CM

## 2012-06-03 DIAGNOSIS — R0989 Other specified symptoms and signs involving the circulatory and respiratory systems: Secondary | ICD-10-CM | POA: Insufficient documentation

## 2012-06-03 DIAGNOSIS — E785 Hyperlipidemia, unspecified: Secondary | ICD-10-CM | POA: Insufficient documentation

## 2012-06-03 DIAGNOSIS — R9439 Abnormal result of other cardiovascular function study: Secondary | ICD-10-CM | POA: Insufficient documentation

## 2012-06-03 DIAGNOSIS — R079 Chest pain, unspecified: Secondary | ICD-10-CM

## 2012-06-03 DIAGNOSIS — I252 Old myocardial infarction: Secondary | ICD-10-CM | POA: Insufficient documentation

## 2012-06-03 DIAGNOSIS — R5381 Other malaise: Secondary | ICD-10-CM | POA: Insufficient documentation

## 2012-06-03 DIAGNOSIS — I4949 Other premature depolarization: Secondary | ICD-10-CM

## 2012-06-03 DIAGNOSIS — R0789 Other chest pain: Secondary | ICD-10-CM | POA: Insufficient documentation

## 2012-06-03 MED ORDER — TECHNETIUM TC 99M TETROFOSMIN IV KIT
11.0000 | PACK | Freq: Once | INTRAVENOUS | Status: AC | PRN
  Administered 2012-06-03: 11 via INTRAVENOUS

## 2012-06-03 MED ORDER — TECHNETIUM TC 99M TETROFOSMIN IV KIT
33.0000 | PACK | Freq: Once | INTRAVENOUS | Status: AC | PRN
  Administered 2012-06-03: 33 via INTRAVENOUS

## 2012-06-03 MED ORDER — REGADENOSON 0.4 MG/5ML IV SOLN
0.4000 mg | Freq: Once | INTRAVENOUS | Status: AC
  Administered 2012-06-03: 0.4 mg via INTRAVENOUS

## 2012-06-03 NOTE — Progress Notes (Signed)
[facility] 3 NUCLEAR MED [address] [place] [address] [phone]  Cardiology Nuclear Med Study  Paul [name] is a 66 y.o. male     MRN : [id number]     DOB: 12/05/1945  Procedure Date: 06/03/2012  Nuclear Med Background Indication for Stress Test:  Evaluation for Ischemia and Graft Patency History:  3/05 MI>CABG; 10/05 Cath:2 Grafts occluded(medical tx); '11 MPS:Old IWMI with residual P.I. ischemia, EF=60%. Cardiac Risk Factors: Hypertension, Lipids, NIDDM and Obesity.  Symptoms:  Chest Tightness with Exertion (last date of chest discomfort was about 2-weeks ago), DOE and Fatigue   Nuclear Pre-Procedure Caffeine/Decaff Intake:  None NPO After: 8:30pm   Lungs:  Clear. IV 0.9% NS with Angio Cath:  20g  IV Site: R Antecubital  IV Started by:  Stanton Kidney, EMT-P  Chest Size (in):  46 Cup Size: n/a  Height: 5\' 8"  (1.727 m)  Weight:  217 lb (98.431 kg)  BMI:  Body mass index is 32.99 kg/(m^2). Tech Comments:  Toprol held > 24 hours, CBG= 140 @ 7:45am, per patient.    Nuclear Med Study 1 or 2 day study: 1 day  Stress Test Type:  Treadmill/Lexiscan  Reading MD: Cassell Clement, MD  Order Authorizing Provider:  Cassell Clement, MD  Resting Radionuclide: Technetium 5m Tetrofosmin  Resting Radionuclide Dose: 11.0 mCi   Stress Radionuclide:  Technetium 44m Tetrofosmin  Stress Radionuclide Dose: 32.9 mCi           Stress Protocol Rest HR: 63 Stress HR: 106  Rest BP: 127/83 Stress BP: 144/73  Exercise Time (min): 9:00 METS: 7.4   Predicted Max HR: 154 bpm % Max HR: 68.83 bpm Rate Pressure Product: 29518   Dose of Adenosine (mg):  n/a Dose of Lexiscan: 0.4 mg  Dose of Atropine (mg): n/a Dose of Dobutamine: n/a mcg/kg/min (at max HR)  Stress Test Technologist: Smiley Houseman, CMA-N  Nuclear Technologist:  Doyne Keel, CNMT     Rest Procedure:  Myocardial perfusion imaging was performed at rest 45 minutes following the intravenous  administration of Technetium 25m Tetrofosmin.  Rest ECG: Nonspecific T-wave changes.  Stress Procedure: The patient attempted to walk the treadmill utilizing the Bruce protocol for seven minutes, but was unable to reach his target heart rate.   He then received IV Lexiscan 0.4 mg over 15-seconds with concurrent low level exercise and then Technetium 67m Tetrofosmin was injected at 30-seconds while the patient continued walking one more minute. There were no diagnostic ST-T wave changes with Lexiscan, occasional PVC's and PAC's were noted.  He did c/o chest tightness, 3/10, with infusion.  Quantitative spect images were obtained after a 45-minute delay.  Stress ECG: No significant change from baseline ECG  QPS Raw Data Images:  Normal; no motion artifact; normal heart/lung ratio. Stress Images:  There is decreased uptake in the inferior wall. Rest Images:  There is decreased uptake in the inferior wall. Subtraction (SDS):  There is a fixed defect that is most consistent with a previous infarction with minimal peri-infarct ischemia. Transient Ischemic Dilatation (Normal <1.22):  1.10 Lung/Heart Ratio (Normal <0.45):  0.28  Quantitative Gated Spect Images QGS EDV:  86 ml QGS ESV:  34 ml  Impression Exercise Capacity:  Lexiscan with low level exercise. BP Response:  Normal blood pressure response. Clinical Symptoms:  Typical chest pain. ECG Impression:  No significant ST segment change suggestive of ischemia. Comparison with Prior Nuclear Study: No images to compare  Overall Impression:  Abnormal  stress nuclear study. Old inferior wall MI. There is a medium sized scar of moderate severity involving the apical inferior, mid-inferior and basal inferior segments. There is minimal peri-infarct ischemia. Patient developed chest tightness with walking.  He was unable to achieve target heart rate.  LV Ejection Fraction: 60%.  LV Wall Motion:  Mild inferior wall hypokinesis.  Cassell Clement

## 2012-06-05 ENCOUNTER — Telehealth: Payer: Self-pay | Admitting: *Deleted

## 2012-06-05 DIAGNOSIS — R079 Chest pain, unspecified: Secondary | ICD-10-CM

## 2012-06-05 MED ORDER — NITROGLYCERIN 0.4 MG SL SUBL: 0.4000 mg | SUBLINGUAL_TABLET | SUBLINGUAL | Status: DC | PRN

## 2012-06-05 NOTE — Telephone Encounter (Signed)
Advised patient

## 2012-06-05 NOTE — Telephone Encounter (Signed)
Message copied by Burnell Blanks on Thu Jun 05, 2012  5:25 PM ------      Message from: Cassell Clement      Created: Wed Jun 04, 2012  8:37 PM       Please report.  The stress test showed the old inferior wall scar but no new areas of ischemia.  The EF of 60 % is still normal and unchanged from the last myoview. He needs to stay on same meds and work hard on regular walking and good diabetes control(appt with Dr. Sharl Ma pending?) and weight loss.  Use SL NTG prn.

## 2012-06-17 ENCOUNTER — Telehealth: Payer: Self-pay | Admitting: Cardiology

## 2012-06-17 NOTE — Telephone Encounter (Signed)
New msg  Dr Sharl Ma office needs another referral Please call

## 2012-06-17 NOTE — Telephone Encounter (Signed)
Sent notes and labs to Dr Sharl Ma

## 2012-09-15 ENCOUNTER — Ambulatory Visit: Payer: Managed Care, Other (non HMO) | Admitting: Cardiology

## 2012-09-15 ENCOUNTER — Other Ambulatory Visit: Payer: Managed Care, Other (non HMO)

## 2012-09-16 ENCOUNTER — Other Ambulatory Visit: Payer: Self-pay

## 2012-09-16 ENCOUNTER — Other Ambulatory Visit (INDEPENDENT_AMBULATORY_CARE_PROVIDER_SITE_OTHER): Payer: Managed Care, Other (non HMO)

## 2012-09-16 DIAGNOSIS — IMO0001 Reserved for inherently not codable concepts without codable children: Secondary | ICD-10-CM

## 2012-09-16 DIAGNOSIS — R079 Chest pain, unspecified: Secondary | ICD-10-CM

## 2012-09-16 LAB — HEPATIC FUNCTION PANEL
Albumin: 4 g/dL (ref 3.5–5.2)
Total Bilirubin: 0.7 mg/dL (ref 0.3–1.2)

## 2012-09-16 LAB — CBC WITH DIFFERENTIAL/PLATELET
Basophils Relative: 0.2 % (ref 0.0–3.0)
Eosinophils Absolute: 0.2 10*3/uL (ref 0.0–0.7)
HCT: 38.6 % — ABNORMAL LOW (ref 39.0–52.0)
Hemoglobin: 12.9 g/dL — ABNORMAL LOW (ref 13.0–17.0)
Lymphocytes Relative: 33.5 % (ref 12.0–46.0)
Lymphs Abs: 2 10*3/uL (ref 0.7–4.0)
MCHC: 33.4 g/dL (ref 30.0–36.0)
MCV: 89 fl (ref 78.0–100.0)
Neutro Abs: 3.2 10*3/uL (ref 1.4–7.7)
RBC: 4.34 Mil/uL (ref 4.22–5.81)
RDW: 14.4 % (ref 11.5–14.6)

## 2012-09-16 LAB — BASIC METABOLIC PANEL
CO2: 28 mEq/L (ref 19–32)
Calcium: 10.1 mg/dL (ref 8.4–10.5)
Chloride: 100 mEq/L (ref 96–112)
Glucose, Bld: 159 mg/dL — ABNORMAL HIGH (ref 70–99)
Potassium: 4.6 mEq/L (ref 3.5–5.1)
Sodium: 138 mEq/L (ref 135–145)

## 2012-09-16 LAB — LIPID PANEL
Cholesterol: 153 mg/dL (ref 0–200)
HDL: 33.8 mg/dL — ABNORMAL LOW (ref >39.00)
Total CHOL/HDL Ratio: 5
Triglycerides: 257 mg/dL — ABNORMAL HIGH (ref 0.0–149.0)
VLDL: 51.4 mg/dL — ABNORMAL HIGH (ref 0.0–40.0)

## 2012-09-16 NOTE — Progress Notes (Signed)
Quick Note:  Please make copy of labs for patient visit. ______ 

## 2012-09-18 ENCOUNTER — Telehealth: Payer: Self-pay | Admitting: *Deleted

## 2012-09-18 ENCOUNTER — Encounter: Payer: Self-pay | Admitting: Cardiology

## 2012-09-18 ENCOUNTER — Ambulatory Visit (INDEPENDENT_AMBULATORY_CARE_PROVIDER_SITE_OTHER): Payer: Managed Care, Other (non HMO) | Admitting: Cardiology

## 2012-09-18 VITALS — BP 133/76 | HR 89 | Resp 20 | Ht 68.0 in | Wt 220.1 lb

## 2012-09-18 DIAGNOSIS — E785 Hyperlipidemia, unspecified: Secondary | ICD-10-CM

## 2012-09-18 DIAGNOSIS — I259 Chronic ischemic heart disease, unspecified: Secondary | ICD-10-CM

## 2012-09-18 DIAGNOSIS — E7800 Pure hypercholesterolemia, unspecified: Secondary | ICD-10-CM

## 2012-09-18 DIAGNOSIS — E1169 Type 2 diabetes mellitus with other specified complication: Secondary | ICD-10-CM

## 2012-09-18 DIAGNOSIS — I119 Hypertensive heart disease without heart failure: Secondary | ICD-10-CM

## 2012-09-18 DIAGNOSIS — Z951 Presence of aortocoronary bypass graft: Secondary | ICD-10-CM

## 2012-09-18 DIAGNOSIS — J40 Bronchitis, not specified as acute or chronic: Secondary | ICD-10-CM

## 2012-09-18 MED ORDER — METOPROLOL SUCCINATE ER 50 MG PO TB24: 50.0000 mg | ORAL_TABLET | Freq: Every day | ORAL | Status: DC

## 2012-09-18 MED ORDER — AZITHROMYCIN 250 MG PO TABS: ORAL_TABLET | ORAL | Status: DC

## 2012-09-18 MED ORDER — SIMVASTATIN 20 MG PO TABS: 40.0000 mg | ORAL_TABLET | Freq: Every day | ORAL | Status: DC

## 2012-09-18 MED ORDER — CITALOPRAM HYDROBROMIDE 20 MG PO TABS: 20.0000 mg | ORAL_TABLET | Freq: Every day | ORAL | Status: DC

## 2012-09-18 MED ORDER — AMLODIPINE BESYLATE 5 MG PO TABS: 5.0000 mg | ORAL_TABLET | Freq: Every day | ORAL | Status: DC

## 2012-09-18 MED ORDER — SIMVASTATIN 20 MG PO TABS: 20.0000 mg | ORAL_TABLET | Freq: Every day | ORAL | Status: DC

## 2012-09-18 NOTE — Assessment & Plan Note (Signed)
The patient has not been experiencing any recurrent or accelerating angina.  He will get mild chest tightness if he starts and activity and goes too fast initially.  [place] he can walk a mile or 2 without difficulty

## 2012-09-18 NOTE — Telephone Encounter (Signed)
Left message to call back regarding decreasing his Zocor secondary to being on Amlodipine (decrease to 20 mg daily)

## 2012-09-18 NOTE — Assessment & Plan Note (Signed)
The patient is under the care of Dr. Sharl Ma for his diabetes.  The patient is now on a higher dose of metformin and appears to be doing well

## 2012-09-18 NOTE — Assessment & Plan Note (Signed)
Blood pressure is remaining stable on current therapy. 

## 2012-09-18 NOTE — Assessment & Plan Note (Signed)
The patient has dyslipidemia.  He previously had been on 40 mg of simvastatin but now because he is also on amlodipine we will need to decrease his simvastatin dose down to 20 mg daily

## 2012-09-18 NOTE — Patient Instructions (Addendum)
Rx for Zpak sent to pharmacy, continue all other medications  Your physician recommends that you schedule a follow-up appointment in: 4 months with fasting labs (lp/bmet/hfp)

## 2012-09-18 NOTE — Progress Notes (Signed)
Paul [name] [facility] Date of Birth:  May 12, 1946 [name] [address] [place], [place]  [address] [phone]         Fax   [phone]  History of Present Illness: This pleasant 66 year old gentleman is seen for a scheduled followup office visit. He has a history of diabetes mellitus, coronary heart disease with prior coronary bypass graft surgery, essential hypertension, and hypercholesterolemia.  His last stress test was on 06/13/12.  He underwent a treadmill Myoview but had to be switched to Lexa scan because he was unable to reach target heart rate.  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  His overall ejection fraction was 60% with mild inferior wall hypokinesis. He is status post cholecystectomy. He also has a past history of prostate cancer in the past history of kidney stones. Dr. Laverle Patter is his urologist.   Current Outpatient Prescriptions  Medication Sig Dispense Refill  . amLODipine (NORVASC) 5 MG tablet Take 1 tablet (5 mg total) by mouth daily.  90 tablet  3  . aspirin 325 MG tablet Take 325 mg by mouth daily.        . citalopram (CELEXA) 20 MG tablet Take 1 tablet (20 mg total) by mouth daily.  90 tablet  3  . fluticasone (FLONASE) 50 MCG/ACT nasal spray Place 2 sprays into the nose daily.  16 g  PRN  . GuaiFENesin (MUCINEX PO) Take by mouth. As needed       . losartan-hydrochlorothiazide (HYZAAR) 100-25 MG per tablet Take 1 tablet by mouth daily.  90 each  3  . metFORMIN (GLUCOPHAGE) 500 MG tablet 2 IN THE MORNING AND 1 IN THE EVENING  270 tablet  3  . metoprolol succinate (TOPROL-XL) 50 MG 24 hr tablet Take 1 tablet (50 mg total) by mouth daily. Take with or immediately following a meal.  90 tablet  3  . Multiple Vitamin (MULTIVITAMIN) tablet Take 1 tablet by mouth daily.        . nitroGLYCERIN (NITROSTAT) 0.4 MG SL tablet Place 1 tablet (0.4 mg total) under the tongue every 5 (five) minutes as  needed for chest pain.  25 tablet  12  . Omega-3 Fatty Acids (FISH OIL) 1000 MG CAPS Take by mouth daily.        Marland Kitchen omeprazole (PRILOSEC) 40 MG capsule Take 40 mg by mouth daily.        [name] VERIO test strip       . potassium citrate (UROCIT-K) 10 MEQ (1080 MG) SR tablet Take 1,080 mEq by mouth Twice daily.      . simvastatin (ZOCOR) 20 MG tablet Take 1 tablet (20 mg total) by mouth at bedtime.  90 tablet  3  . [DISCONTINUED] amLODipine (NORVASC) 5 MG tablet TAKE ONE TABLET BY MOUTH EVERY DAY  30 tablet  11  . [DISCONTINUED] citalopram (CELEXA) 20 MG tablet TAKE ONE TABLET BY MOUTH DAILY  30 tablet  6  . [DISCONTINUED] metoprolol succinate (TOPROL-XL) 50 MG 24 hr tablet Take 1 tablet (50 mg total) by mouth daily. Take with or immediately following a meal.  90 tablet  3  . azithromycin (ZITHROMAX) 250 MG tablet 1 tablet twice a day on day 1 and then 1 tablet daily until finished  6 each  0  . EPINEPHrine (EPIPEN) 0.3 mg/0.3 mL DEVI Inject 0.3 mLs (0.3 mg total) into the muscle once.  1 Device  PRN    Allergies  Allergen Reactions  . Alprazolam     No good  . Percocet (Oxycodone-Acetaminophen)     nightmares  . Septra Ds (Sulfamethoxazole W-Trimethoprim)     rash    Patient Active Problem List  Diagnosis  . Hx of CABG  . Diabetes mellitus  . Benign hypertensive heart disease without heart failure  . Dyslipidemia associated with type 2 diabetes mellitus  . History of prostate cancer  . Cholecystitis chronic, acute  . S/P cholecystectomy  . Renal colic    History  Smoking status  . Never Smoker   Smokeless tobacco  . Not on file    History  Alcohol Use No    No family history on file.  Review of Systems: Constitutional: no fever chills diaphoresis or fatigue or change in weight.  Head and neck: no hearing loss, no epistaxis, no photophobia or visual disturbance. Respiratory: No cough, shortness of breath or wheezing. Cardiovascular: No chest pain peripheral edema,  palpitations. Gastrointestinal: No abdominal distention, no abdominal pain, no change in bowel habits hematochezia or melena. Genitourinary: No dysuria, no frequency, no urgency, no nocturia. Musculoskeletal:No arthralgias, no back pain, no gait disturbance or myalgias. Neurological: No dizziness, no headaches, no numbness, no seizures, no syncope, no weakness, no tremors. Hematologic: No lymphadenopathy, no easy bruising. Psychiatric: No confusion, no hallucinations, no sleep disturbance.    Physical Exam: Filed Vitals:   09/18/12 0913  BP: 133/76  Pulse: 89  Resp: 20   the general appearance reveals a well-developed well-nourished gentleman in no distress.The head and neck exam reveals pupils equal and reactive.  Extraocular movements are full.  There is no scleral icterus.  The mouth and pharynx are normal.  The neck is supple.  The carotids reveal no bruits.  The jugular venous pressure is normal.  The  thyroid is not enlarged.  There is no lymphadenopathy.  The chest is clear to percussion and auscultation.  There are no rales or rhonchi.  Expansion of the chest is symmetrical.  The precordium is quiet.  The first heart sound is normal.  The second heart sound is physiologically split.  There is no murmur gallop rub or click.  There is no abnormal lift or heave.  The abdomen is soft and nontender.  The bowel sounds are normal.  The liver and spleen are not enlarged.  There are no abdominal masses.  There are no abdominal bruits.  Extremities reveal good pedal pulses.  There is no phlebitis or edema.  There is no cyanosis or clubbing.  Strength is normal and symmetrical in all extremities.  There is no lateralizing weakness.  There are no sensory deficits.  The skin is warm and dry.  There is no rash.     Assessment / Plan: Continue same meds except reduce simvastatin down to 20 mg daily.  He has sinusitis and we gave him a Z-Pak today.  He'll return in 4 months for followup office visit  lipid panel hepatic function panel and basal metabolic panel

## 2012-09-19 NOTE — Telephone Encounter (Signed)
Spoke with patient and advised of the change in Simvastatin

## 2012-12-04 ENCOUNTER — Encounter (INDEPENDENT_AMBULATORY_CARE_PROVIDER_SITE_OTHER): Payer: Self-pay | Admitting: General Surgery

## 2012-12-04 ENCOUNTER — Ambulatory Visit (INDEPENDENT_AMBULATORY_CARE_PROVIDER_SITE_OTHER): Payer: Medicare HMO | Admitting: General Surgery

## 2012-12-04 VITALS — BP 112/74 | HR 82 | Resp 18 | Ht 68.0 in | Wt 219.0 lb

## 2012-12-04 DIAGNOSIS — Z9049 Acquired absence of other specified parts of digestive tract: Secondary | ICD-10-CM

## 2012-12-04 DIAGNOSIS — Z9089 Acquired absence of other organs: Secondary | ICD-10-CM

## 2012-12-04 DIAGNOSIS — R197 Diarrhea, unspecified: Secondary | ICD-10-CM

## 2012-12-04 NOTE — Progress Notes (Signed)
Subjective:     Patient ID: Paul [name], male   DOB: April 24, 1946, 67 y.o.   MRN: [id number]  HPI [name] is a 67yom who underwent lap chole in 2012 for cholecystitis.  He has done well since then and I saw him once postoperatively.  He has had some intermittent loose stools associated with certain foods but this has worsened since January.  He now has a couple days every week several loose stools without the best control.  This has curtailed some of his outside activities.  He doesn't really associate this with eating anything in particular, drinking anything in particular or at any time of day  This has been bothersome to him and he comes in today to have this evaluated.  He does have history of csc and says that he is due in a couple of years now.  He otherwise is eating ok, not losing weight, glucose have been under subtoptimal control  Review of Systems     Objective:   Physical Exam Abdomen is soft and nontender, he has well healed scars    Assessment:     Diarrhea, prior lap chole    Plan:     We discussed ensuring glucose under good control.  This may be gallbladder related or not.  I told him would be reasonable to just try some otc antidiarrheals for now and see how that works.  If that works then I don't think anything else needs to be done and he can use prn.  If not I asked him to call and we could discuss next step or at some point have him see GI again.

## 2012-12-04 NOTE — Patient Instructions (Signed)
Take immodium per package instructions and call me in 2-3 weeks to let me know how it is going

## 2013-01-19 ENCOUNTER — Other Ambulatory Visit: Payer: Managed Care, Other (non HMO)

## 2013-01-21 ENCOUNTER — Ambulatory Visit: Payer: Managed Care, Other (non HMO) | Admitting: Cardiology

## 2013-02-02 ENCOUNTER — Telehealth (INDEPENDENT_AMBULATORY_CARE_PROVIDER_SITE_OTHER): Payer: Self-pay | Admitting: *Deleted

## 2013-02-02 NOTE — Telephone Encounter (Signed)
Patient called to state that the Imodium AD is only working for a short time and just prolongs the diarrhea but he continues to have issue with it.  Patient states he was to call Dr. Dwain Sarna back if it continued so receive a possible prescription for the diarrhea.

## 2013-02-02 NOTE — Telephone Encounter (Signed)
He has diarrhea after cholecystectomy.  If he could see gi now that would be ideal.  i mentioned that to him.  [employer] [place] [facility]

## 2013-02-03 ENCOUNTER — Telehealth (INDEPENDENT_AMBULATORY_CARE_PROVIDER_SITE_OTHER): Payer: Self-pay

## 2013-02-03 ENCOUNTER — Telehealth (INDEPENDENT_AMBULATORY_CARE_PROVIDER_SITE_OTHER): Payer: Self-pay | Admitting: General Surgery

## 2013-02-03 DIAGNOSIS — R197 Diarrhea, unspecified: Secondary | ICD-10-CM

## 2013-02-03 NOTE — Telephone Encounter (Signed)
Patient is aware of appt with Dr Randa Evens 02/05/13 at [facility]

## 2013-02-03 NOTE — Telephone Encounter (Signed)
Spoke with patient he is aware of appt with Dr Randa Evens 02/05/13 at [facility]

## 2013-02-03 NOTE — Telephone Encounter (Signed)
Called pt back to let him know that Dr Dwain Sarna said he really should see GI now since still having diarrhea after lap chole. I offered to refer the pt to [facility] or [facility]. The pt is already a pt of Dr Randa Evens with [facility] so I will make him an appt with Dr Randa Evens. We will call the pt with an appt.

## 2013-02-23 ENCOUNTER — Other Ambulatory Visit: Payer: Managed Care, Other (non HMO)

## 2013-02-27 ENCOUNTER — Ambulatory Visit: Payer: Managed Care, Other (non HMO) | Admitting: Cardiology

## 2013-03-10 ENCOUNTER — Other Ambulatory Visit: Payer: Self-pay | Admitting: Gastroenterology

## 2013-04-27 ENCOUNTER — Other Ambulatory Visit (INDEPENDENT_AMBULATORY_CARE_PROVIDER_SITE_OTHER): Payer: Managed Care, Other (non HMO)

## 2013-04-27 DIAGNOSIS — I119 Hypertensive heart disease without heart failure: Secondary | ICD-10-CM

## 2013-04-27 DIAGNOSIS — E7800 Pure hypercholesterolemia, unspecified: Secondary | ICD-10-CM

## 2013-04-27 LAB — BASIC METABOLIC PANEL
BUN: 21 mg/dL (ref 6–23)
CO2: 28 mEq/L (ref 19–32)
Glucose, Bld: 152 mg/dL — ABNORMAL HIGH (ref 70–99)
Potassium: 3.9 mEq/L (ref 3.5–5.1)
Sodium: 138 mEq/L (ref 135–145)

## 2013-04-27 LAB — HEPATIC FUNCTION PANEL
AST: 32 U/L (ref 0–37)
Bilirubin, Direct: 0.1 mg/dL (ref 0.0–0.3)
Total Bilirubin: 0.6 mg/dL (ref 0.3–1.2)

## 2013-04-27 LAB — LIPID PANEL
Total CHOL/HDL Ratio: 5
Triglycerides: 203 mg/dL — ABNORMAL HIGH (ref 0.0–149.0)

## 2013-04-27 NOTE — Progress Notes (Signed)
Quick Note:  Please make copy of labs for patient visit. ______ 

## 2013-04-30 ENCOUNTER — Ambulatory Visit (INDEPENDENT_AMBULATORY_CARE_PROVIDER_SITE_OTHER): Payer: 59 | Admitting: Cardiology

## 2013-04-30 ENCOUNTER — Encounter: Payer: Self-pay | Admitting: Cardiology

## 2013-04-30 VITALS — BP 110/80 | HR 78 | Ht 68.0 in | Wt 220.1 lb

## 2013-04-30 DIAGNOSIS — G4730 Sleep apnea, unspecified: Secondary | ICD-10-CM

## 2013-04-30 DIAGNOSIS — R079 Chest pain, unspecified: Secondary | ICD-10-CM

## 2013-04-30 DIAGNOSIS — F329 Major depressive disorder, single episode, unspecified: Secondary | ICD-10-CM

## 2013-04-30 DIAGNOSIS — I119 Hypertensive heart disease without heart failure: Secondary | ICD-10-CM

## 2013-04-30 DIAGNOSIS — E7800 Pure hypercholesterolemia, unspecified: Secondary | ICD-10-CM

## 2013-04-30 MED ORDER — AMLODIPINE BESYLATE 5 MG PO TABS: 5.0000 mg | ORAL_TABLET | Freq: Every day | ORAL | Status: DC

## 2013-04-30 MED ORDER — SIMVASTATIN 20 MG PO TABS: 20.0000 mg | ORAL_TABLET | Freq: Every day | ORAL | Status: DC

## 2013-04-30 MED ORDER — LOSARTAN POTASSIUM-HCTZ 100-25 MG PO TABS: 1.0000 | ORAL_TABLET | Freq: Every day | ORAL | Status: DC

## 2013-04-30 MED ORDER — METOPROLOL SUCCINATE ER 50 MG PO TB24: 50.0000 mg | ORAL_TABLET | Freq: Every day | ORAL | Status: DC

## 2013-04-30 MED ORDER — NITROGLYCERIN 0.4 MG SL SUBL: 0.4000 mg | SUBLINGUAL_TABLET | SUBLINGUAL | Status: DC | PRN

## 2013-04-30 NOTE — Patient Instructions (Signed)
Your physician recommends that you continue on your current medications as directed. Please refer to the Current Medication list given to you today.  Your physician wants you to follow-up in: 4 months with fasting labs (lp/bmet/hfp) You will receive a reminder letter in the mail two months in advance. If you don't receive a letter, please call our office to schedule the follow-up appointment.  

## 2013-04-30 NOTE — Assessment & Plan Note (Signed)
The patient has not been having any symptoms of congestive heart failure.  No chest pain to suggest angina pectoris.

## 2013-04-30 NOTE — Assessment & Plan Note (Signed)
The patient now uses a CPAP machine and is awakening more rested.  He anticipates that his exercise program can be restarted now that he is feeling better.

## 2013-04-30 NOTE — Assessment & Plan Note (Signed)
The patient has had symptoms of depression which has interfered with his desire and ability to exercise regularly.  He initially was placed on Celexa but did not respond and then was switched to Lexapro which has worked.  He is also seeing a psychologist counselor Dr. Eyvonne Mechanic.

## 2013-04-30 NOTE — Progress Notes (Signed)
Paul [name] [facility] Date of Birth:  [birth year] [facility] [address] [place], [place]  [address] [phone]         Fax   [phone]  History of Present Illness: This pleasant 67 year old gentleman is seen for a scheduled followup office visit. He has a history of diabetes mellitus, coronary heart disease with prior coronary bypass graft surgery, essential hypertension, and hypercholesterolemia. His last stress test was on 06/13/12. He underwent a treadmill Myoview but had to be switched to Lexa scan because he was unable to reach target heart rate. 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 His overall ejection fraction was 60% with mild inferior wall hypokinesis. He is status post cholecystectomy. He also has a past history of prostate cancer in the past history of kidney stones. Dr. Laverle Patter is his urologist.  Since we last saw him he has been diagnosed with depression by his PCP Dr. Corliss Blacker.  He has also been diagnosed with sleep apnea by Dr. Theressa Millard and now wears a CPAP machine.  Dr. Sharl Ma is his endocrinologist and follows his thyroid function.   Current Outpatient Prescriptions  Medication Sig Dispense Refill  . amLODipine (NORVASC) 5 MG tablet Take 1 tablet (5 mg total) by mouth daily.  90 tablet  3  . aspirin 325 MG tablet Take 325 mg by mouth daily.        . Colestipol HCl (COLESTID PO) Take 1 g by mouth.      . EPINEPHrine (EPIPEN) 0.3 mg/0.3 mL DEVI Inject 0.3 mLs (0.3 mg total) into the muscle once.  1 Device  PRN  . escitalopram (LEXAPRO) 10 MG tablet Take 10 mg by mouth daily.      . fluticasone (FLONASE) 50 MCG/ACT nasal spray Place 2 sprays into the nose daily.  16 g  PRN  . GuaiFENesin (MUCINEX PO) Take by mouth. As needed       . levothyroxine (SYNTHROID, LEVOTHROID) 25 MCG tablet Take 25 mcg by mouth daily before breakfast.       . losartan-hydrochlorothiazide (HYZAAR) 100-25 MG per tablet  Take 1 tablet by mouth daily.  90 tablet  3  . metFORMIN (GLUCOPHAGE) 500 MG tablet 2 IN THE MORNING AND 1 IN THE EVENING  270 tablet  3  . metoprolol succinate (TOPROL-XL) 50 MG 24 hr tablet Take 1 tablet (50 mg total) by mouth daily. Take with or immediately following a meal.  90 tablet  3  . Multiple Vitamin (MULTIVITAMIN) tablet Take 1 tablet by mouth daily.        . nitroGLYCERIN (NITROSTAT) 0.4 MG SL tablet Place 1 tablet (0.4 mg total) under the tongue every 5 (five) minutes as needed for chest pain.  25 tablet  12  . Omega-3 Fatty Acids (FISH OIL) 1000 MG CAPS Take by mouth daily.        Marland Kitchen omeprazole (PRILOSEC) 40 MG capsule Take 40 mg by mouth daily.        [name] VERIO test strip       . potassium citrate (UROCIT-K) 10 MEQ (1080 MG) SR tablet Take 1,080 mEq by mouth Twice daily.      . simvastatin (ZOCOR) 20 MG tablet Take 1 tablet (20 mg total) by mouth at bedtime.  90 tablet  3   No current facility-administered medications for this visit.    Allergies  Allergen Reactions  . Alprazolam     No good  .  Percocet (Oxycodone-Acetaminophen)     nightmares  . Septra Ds (Sulfamethoxazole W-Trimethoprim)     rash    Patient Active Problem List   Diagnosis Date Noted  . Hx of CABG 03/21/2011    Priority: High  . Diabetes mellitus 03/21/2011    Priority: High  . Benign hypertensive heart disease without heart failure 03/21/2011    Priority: Medium  . History of prostate cancer 03/21/2011    Priority: Medium  . S/P cholecystectomy 07/11/2011  . Renal colic 07/11/2011  . Cholecystitis chronic, acute 05/08/2011  . Dyslipidemia associated with type 2 diabetes mellitus 03/21/2011    History  Smoking status  . Never Smoker   Smokeless tobacco  . Not on file    History  Alcohol Use No    No family history on file.  Review of Systems: Constitutional: no fever chills diaphoresis or fatigue or change in weight.  Head and neck: no hearing loss, no epistaxis, no  photophobia or visual disturbance. Respiratory: No cough, shortness of breath or wheezing. Cardiovascular: No chest pain peripheral edema, palpitations. Gastrointestinal: No abdominal distention, no abdominal pain, no change in bowel habits hematochezia or melena. Genitourinary: No dysuria, no frequency, no urgency, no nocturia. Musculoskeletal:No arthralgias, no back pain, no gait disturbance or myalgias. Neurological: No dizziness, no headaches, no numbness, no seizures, no syncope, no weakness, no tremors. Hematologic: No lymphadenopathy, no easy bruising. Psychiatric: No confusion, no hallucinations, no sleep disturbance.    Physical Exam: Filed Vitals:   04/30/13 1134  BP: 110/80  Pulse: 78   the general appearance reveals a well-developed mildly depressed appearing middle-aged gentleman in no distress.The head and neck exam reveals pupils equal and reactive.  Extraocular movements are full.  There is no scleral icterus.  The mouth and pharynx are normal.  The neck is supple.  The carotids reveal no bruits.  The jugular venous pressure is normal.  The  thyroid is not enlarged.  There is no lymphadenopathy.  The chest is clear to percussion and auscultation.  There are no rales or rhonchi.  Expansion of the chest is symmetrical.  The precordium is quiet.  The first heart sound is normal.  The second heart sound is physiologically split.  There is no murmur gallop rub or click.  There is no abnormal lift or heave.  The abdomen is soft and nontender.  The bowel sounds are normal.  The liver and spleen are not enlarged.  There are no abdominal masses.  There are no abdominal bruits.  Extremities reveal good pedal pulses.  There is no phlebitis or edema.  There is no cyanosis or clubbing.  Strength is normal and symmetrical in all extremities.  There is no lateralizing weakness.  There are no sensory deficits.  The skin is warm and dry.  There is no rash.  EKG shows normal sinus rhythm with left  axis deviation and nonspecific T-wave abnormality  Assessment / Plan:  The patient is to continue same medication.  He will work harder on regular exercise and continued weight loss. Recheck 4 months for office visit lipid panel hepatic function panel and basal metabolic panel.

## 2013-06-08 ENCOUNTER — Telehealth: Payer: Self-pay

## 2013-06-08 NOTE — Telephone Encounter (Signed)
Patient called about his hyzarr being called in I called the pham and they did not get it so i gave a verbal order of the same one in computer

## 2013-09-07 ENCOUNTER — Other Ambulatory Visit (INDEPENDENT_AMBULATORY_CARE_PROVIDER_SITE_OTHER): Payer: Commercial Managed Care - HMO

## 2013-09-07 DIAGNOSIS — I119 Hypertensive heart disease without heart failure: Secondary | ICD-10-CM

## 2013-09-07 LAB — HEPATIC FUNCTION PANEL
ALT: 32 U/L (ref 0–53)
Alkaline Phosphatase: 30 U/L — ABNORMAL LOW (ref 39–117)
Bilirubin, Direct: 0.1 mg/dL (ref 0.0–0.3)
Total Bilirubin: 0.6 mg/dL (ref 0.3–1.2)
Total Protein: 7.1 g/dL (ref 6.0–8.3)

## 2013-09-07 LAB — BASIC METABOLIC PANEL
BUN: 16 mg/dL (ref 6–23)
Chloride: 102 mEq/L (ref 96–112)
Creatinine, Ser: 1.1 mg/dL (ref 0.4–1.5)
GFR: 67.95 mL/min (ref >60.00)
Glucose, Bld: 128 mg/dL — ABNORMAL HIGH (ref 70–99)
Potassium: 3.8 mEq/L (ref 3.5–5.1)

## 2013-09-07 LAB — LIPID PANEL: Cholesterol: 135 mg/dL (ref 0–200)

## 2013-09-07 NOTE — Progress Notes (Signed)
Quick Note:  Please make copy of labs for patient visit. ______ 

## 2013-09-11 ENCOUNTER — Encounter: Payer: Self-pay | Admitting: Cardiology

## 2013-09-11 ENCOUNTER — Ambulatory Visit (INDEPENDENT_AMBULATORY_CARE_PROVIDER_SITE_OTHER): Payer: 59 | Admitting: Cardiology

## 2013-09-11 VITALS — BP 126/78 | HR 78 | Ht 68.0 in | Wt 223.0 lb

## 2013-09-11 DIAGNOSIS — E7800 Pure hypercholesterolemia, unspecified: Secondary | ICD-10-CM

## 2013-09-11 DIAGNOSIS — I119 Hypertensive heart disease without heart failure: Secondary | ICD-10-CM | POA: Diagnosis not present

## 2013-09-11 DIAGNOSIS — Z951 Presence of aortocoronary bypass graft: Secondary | ICD-10-CM

## 2013-09-11 NOTE — Assessment & Plan Note (Signed)
The patient is not having any hypoglycemic episodes.  His weight is up 3 pounds since last visit.  He states that his endocrinologist Dr. Sharl Ma has placed him on Janumet in the morning and he takes metformin in the evening.

## 2013-09-11 NOTE — Patient Instructions (Signed)
Your physician recommends that you continue on your current medications as directed. Please refer to the Current Medication list given to you today.  Your physician recommends that you schedule a follow-up appointment in: 4 MONTHS with Dr Patty Sermons for EKG/LIPID/LIVER and BMP

## 2013-09-11 NOTE — Assessment & Plan Note (Signed)
The patient has not been experiencing any exertional chest pain or angina.  His walking has been limited recently because he tore and abductor muscle in his right thigh and has seen his orthopedist Dr. Darrelyn Hillock

## 2013-09-11 NOTE — Assessment & Plan Note (Signed)
The patient is not having any symptoms from his blood pressure.  No dizziness or syncope.

## 2013-09-11 NOTE — Progress Notes (Signed)
Paul [name] [facility] Date of Birth:  October 11, 1946 [name] [name], [name]  [address] [phone]         Fax   [phone]  History of Present Illness: This pleasant 67 year old gentleman is seen for a scheduled followup office visit. He has a history of diabetes mellitus, coronary heart disease with prior coronary bypass graft surgery, essential hypertension, and hypercholesterolemia. His last stress test was on 06/13/12. He underwent a treadmill Myoview but had to be switched to Lexa scan because he was unable to reach target heart rate. 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 His overall ejection fraction was 60% with mild inferior wall hypokinesis. He is status post cholecystectomy. He also has a past history of prostate cancer in the past history of kidney stones. Dr. Laverle Patter is his urologist.  Since we last saw him he has been diagnosed with depression by his PCP Dr. Corliss Blacker.  His symptoms of depression and responded to Lexapro.  He has also been diagnosed with sleep apnea by Dr. Theressa Millard and now wears a CPAP machine.  Dr. Sharl Ma is his endocrinologist and follows his thyroid function and his diabetes.   Current Outpatient Prescriptions  Medication Sig Dispense Refill  . amLODipine (NORVASC) 5 MG tablet Take 1 tablet (5 mg total) by mouth daily.  90 tablet  3  . aspirin 325 MG tablet Take 325 mg by mouth daily.        . clindamycin (CLEOCIN) 300 MG capsule Take 300 mg by mouth. Only prior to dental procedures      . Colestipol HCl (COLESTID PO) Take 1 g by mouth.      . EPINEPHrine (EPIPEN) 0.3 mg/0.3 mL DEVI Inject 0.3 mLs (0.3 mg total) into the muscle once.  1 Device  PRN  . escitalopram (LEXAPRO) 10 MG tablet Take 10 mg by mouth daily.      . fluticasone (FLONASE) 50 MCG/ACT nasal spray Place 2 sprays into the nose daily.  16 g  PRN  . GuaiFENesin (MUCINEX PO) Take by mouth. As needed       . JANUMET  50-1000 MG per tablet Take 1 tablet by mouth daily.       Marland Kitchen levothyroxine (SYNTHROID, LEVOTHROID) 25 MCG tablet Take 25 mcg by mouth daily before breakfast.       . losartan-hydrochlorothiazide (HYZAAR) 100-25 MG per tablet Take 1 tablet by mouth daily.  90 tablet  3  . metFORMIN (GLUCOPHAGE) 500 MG tablet 1 IN THE EVENING      . metoprolol succinate (TOPROL-XL) 50 MG 24 hr tablet Take 1 tablet (50 mg total) by mouth daily. Take with or immediately following a meal.  90 tablet  3  . Multiple Vitamin (MULTIVITAMIN) tablet Take 1 tablet by mouth daily.        . nitroGLYCERIN (NITROSTAT) 0.4 MG SL tablet Place 1 tablet (0.4 mg total) under the tongue every 5 (five) minutes as needed for chest pain.  25 tablet  12  . Omega-3 Fatty Acids (FISH OIL) 1000 MG CAPS Take by mouth daily.        Marland Kitchen omeprazole (PRILOSEC) 40 MG capsule Take 40 mg by mouth daily.        [name] test strip       . potassium citrate (UROCIT-K) 10 MEQ (1080 MG) SR tablet Take 1,080 mEq by mouth Twice daily.      . simvastatin (ZOCOR) 20  MG tablet Take 1 tablet (20 mg total) by mouth at bedtime.  90 tablet  3   No current facility-administered medications for this visit.    Allergies  Allergen Reactions  . Alprazolam     No good  . Percocet [Oxycodone-Acetaminophen]     nightmares  . Septra Ds [Sulfamethoxazole-Trimethoprim]     rash    Patient Active Problem List   Diagnosis Date Noted  . Hx of CABG 03/21/2011    Priority: High  . Diabetes mellitus 03/21/2011    Priority: High  . Benign hypertensive heart disease without heart failure 03/21/2011    Priority: Medium  . History of prostate cancer 03/21/2011    Priority: Medium  . Depression 04/30/2013  . Sleep apnea 04/30/2013  . S/P cholecystectomy 07/11/2011  . Renal colic 07/11/2011  . Cholecystitis chronic, acute 05/08/2011  . Dyslipidemia associated with type 2 diabetes mellitus 03/21/2011    History  Smoking status  . Never Smoker   Smokeless  tobacco  . Not on file    History  Alcohol Use No    No family history on file.  Review of Systems: Constitutional: no fever chills diaphoresis or fatigue or change in weight.  Head and neck: no hearing loss, no epistaxis, no photophobia or visual disturbance. Respiratory: No cough, shortness of breath or wheezing. Cardiovascular: No chest pain peripheral edema, palpitations. Gastrointestinal: No abdominal distention, no abdominal pain, no change in bowel habits hematochezia or melena. Genitourinary: No dysuria, no frequency, no urgency, no nocturia. Musculoskeletal:No arthralgias, no back pain, no gait disturbance or myalgias. Neurological: No dizziness, no headaches, no numbness, no seizures, no syncope, no weakness, no tremors. Hematologic: No lymphadenopathy, no easy bruising. Psychiatric: No confusion, no hallucinations, no sleep disturbance.    Physical Exam: Filed Vitals:   09/11/13 0951  BP: 126/78  Pulse: 78   the general appearance reveals a well-developed mildly depressed appearing middle-aged gentleman in no distress.The head and neck exam reveals pupils equal and reactive.  Extraocular movements are full.  There is no scleral icterus.  The mouth and pharynx are normal.  The neck is supple.  The carotids reveal no bruits.  The jugular venous pressure is normal.  The  thyroid is not enlarged.  There is no lymphadenopathy.  The chest is clear to percussion and auscultation.  There are no rales or rhonchi.  Expansion of the chest is symmetrical.  The precordium is quiet.  The first heart sound is normal.  The second heart sound is physiologically split.  There is no murmur gallop rub or click.  There is no abnormal lift or heave.  The abdomen is soft and nontender.  The bowel sounds are normal.  The liver and spleen are not enlarged.  There are no abdominal masses.  There are no abdominal bruits.  Extremities reveal good pedal pulses.  There is no phlebitis or edema.  There is  no cyanosis or clubbing.  Strength is normal and symmetrical in all extremities.  There is no lateralizing weakness.  There are no sensory deficits.  The skin is warm and dry.  There is no rash.  Assessment / Plan:  The patient is to continue same medication.  He will work harder on regular exercise and continued weight loss. Recheck 4 months for office visit, EKG, lipid panel hepatic function panel and basal metabolic panel.

## 2014-01-14 ENCOUNTER — Other Ambulatory Visit (INDEPENDENT_AMBULATORY_CARE_PROVIDER_SITE_OTHER): Payer: Commercial Managed Care - HMO | Admitting: *Deleted

## 2014-01-14 DIAGNOSIS — I119 Hypertensive heart disease without heart failure: Secondary | ICD-10-CM

## 2014-01-14 DIAGNOSIS — E7800 Pure hypercholesterolemia, unspecified: Secondary | ICD-10-CM

## 2014-01-14 LAB — LIPID PANEL
CHOLESTEROL: 131 mg/dL (ref 0–200)
HDL: 33.7 mg/dL — ABNORMAL LOW (ref >39.00)
LDL Cholesterol: 62 mg/dL (ref 0–99)
TRIGLYCERIDES: 178 mg/dL — AB (ref 0.0–149.0)
Total CHOL/HDL Ratio: 4
VLDL: 35.6 mg/dL (ref 0.0–40.0)

## 2014-01-14 LAB — HEPATIC FUNCTION PANEL
ALBUMIN: 4.1 g/dL (ref 3.5–5.2)
ALK PHOS: 30 U/L — AB (ref 39–117)
ALT: 36 U/L (ref 0–53)
AST: 36 U/L (ref 0–37)
Bilirubin, Direct: 0 mg/dL (ref 0.0–0.3)
TOTAL PROTEIN: 7.3 g/dL (ref 6.0–8.3)
Total Bilirubin: 0.6 mg/dL (ref 0.3–1.2)

## 2014-01-14 LAB — BASIC METABOLIC PANEL
BUN: 18 mg/dL (ref 6–23)
CO2: 25 meq/L (ref 19–32)
CREATININE: 1.3 mg/dL (ref 0.4–1.5)
Calcium: 9.4 mg/dL (ref 8.4–10.5)
Chloride: 100 mEq/L (ref 96–112)
GFR: 59.93 mL/min — ABNORMAL LOW (ref >60.00)
Glucose, Bld: 117 mg/dL — ABNORMAL HIGH (ref 70–99)
Potassium: 3.9 mEq/L (ref 3.5–5.1)
Sodium: 138 mEq/L (ref 135–145)

## 2014-01-14 NOTE — Progress Notes (Signed)
Quick Note:  Please make copy of labs for patient visit. ______ 

## 2014-01-18 ENCOUNTER — Ambulatory Visit (INDEPENDENT_AMBULATORY_CARE_PROVIDER_SITE_OTHER): Payer: Commercial Managed Care - HMO | Admitting: Cardiology

## 2014-01-18 ENCOUNTER — Encounter: Payer: Self-pay | Admitting: Cardiology

## 2014-01-18 VITALS — BP 136/84 | HR 73 | Ht 68.0 in | Wt 221.0 lb

## 2014-01-18 DIAGNOSIS — F32A Depression, unspecified: Secondary | ICD-10-CM

## 2014-01-18 DIAGNOSIS — E1165 Type 2 diabetes mellitus with hyperglycemia: Secondary | ICD-10-CM

## 2014-01-18 DIAGNOSIS — I259 Chronic ischemic heart disease, unspecified: Secondary | ICD-10-CM

## 2014-01-18 DIAGNOSIS — Z951 Presence of aortocoronary bypass graft: Secondary | ICD-10-CM

## 2014-01-18 DIAGNOSIS — F3289 Other specified depressive episodes: Secondary | ICD-10-CM

## 2014-01-18 DIAGNOSIS — F329 Major depressive disorder, single episode, unspecified: Secondary | ICD-10-CM

## 2014-01-18 DIAGNOSIS — IMO0001 Reserved for inherently not codable concepts without codable children: Secondary | ICD-10-CM

## 2014-01-18 DIAGNOSIS — E7800 Pure hypercholesterolemia, unspecified: Secondary | ICD-10-CM

## 2014-01-18 DIAGNOSIS — Z8546 Personal history of malignant neoplasm of prostate: Secondary | ICD-10-CM

## 2014-01-18 DIAGNOSIS — I119 Hypertensive heart disease without heart failure: Secondary | ICD-10-CM

## 2014-01-18 MED ORDER — LOSARTAN POTASSIUM-HCTZ 100-25 MG PO TABS: 1.0000 | ORAL_TABLET | Freq: Every day | ORAL | Status: DC

## 2014-01-18 MED ORDER — METOPROLOL SUCCINATE ER 50 MG PO TB24: 50.0000 mg | ORAL_TABLET | Freq: Every day | ORAL | Status: DC

## 2014-01-18 MED ORDER — AMLODIPINE BESYLATE 5 MG PO TABS: 5.0000 mg | ORAL_TABLET | Freq: Every day | ORAL | Status: DC

## 2014-01-18 MED ORDER — SIMVASTATIN 20 MG PO TABS: 20.0000 mg | ORAL_TABLET | Freq: Every day | ORAL | Status: DC

## 2014-01-18 NOTE — Patient Instructions (Signed)
Your physician recommends that you continue on your current medications as directed. Please refer to the Current Medication list given to you today.  Your physician wants you to follow-up in: 4 months with fasting labs (lp/bmet/hfp) You will receive a reminder letter in the mail two months in advance. If you don't receive a letter, please call our office to schedule the follow-up appointment.  

## 2014-01-18 NOTE — Assessment & Plan Note (Signed)
His symptoms of depression appeared to be somewhat better this time

## 2014-01-18 NOTE — Assessment & Plan Note (Signed)
The patient has had occasional mild exertional chest discomfort relieved by rest.  He has not had to take any recent sublingual nitroglycerin.

## 2014-01-18 NOTE — Progress Notes (Signed)
Catha Gosselin [facility] Date of Birth:  1946-07-24 [address] [place], [place]  [address] [phone]         Fax   [phone]  History of Present Illness: This pleasant 68 year old gentleman is seen for a scheduled followup office visit. He has a history of diabetes mellitus, coronary heart disease with prior coronary bypass graft surgery, essential hypertension, and hypercholesterolemia. His last stress test was on 06/13/12. He underwent a treadmill Myoview but had to be switched to Lexa scan because he was unable to reach target heart rate. 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 His overall ejection fraction was 60% with mild inferior wall hypokinesis. He is status post cholecystectomy. He also has a past history of prostate cancer in the past history of kidney stones. Dr. Alinda Money is his urologist.  Since we last saw him he has been diagnosed with depression by his PCP Dr. Addison Lank.  His symptoms of depression and responded to Lexapro.  He has also been diagnosed with sleep apnea by Dr. Nehemiah Settle and now wears a CPAP machine.  Dr. Buddy Duty is his endocrinologist and follows his thyroid function and his diabetes.   Current Outpatient Prescriptions  Medication Sig Dispense Refill  . amLODipine (NORVASC) 5 MG tablet Take 1 tablet (5 mg total) by mouth daily.  90 tablet  3  . aspirin 325 MG tablet Take 325 mg by mouth daily.        . clindamycin (CLEOCIN) 300 MG capsule Take 300 mg by mouth. Only prior to dental procedures      . Colestipol HCl (COLESTID PO) Take 1 g by mouth.      . EPINEPHrine (EPIPEN) 0.3 mg/0.3 mL DEVI Inject 0.3 mLs (0.3 mg total) into the muscle once.  1 Device  PRN  . escitalopram (LEXAPRO) 10 MG tablet Take 10 mg by mouth daily.      . fluticasone (FLONASE) 50 MCG/ACT nasal spray Place 2 sprays into the nose daily.  16 g  PRN  . GuaiFENesin (MUCINEX PO) Take by mouth. As needed       . JANUMET  50-1000 MG per tablet Take 1 tablet by mouth daily.       Marland Kitchen levothyroxine (SYNTHROID, LEVOTHROID) 25 MCG tablet Take 25 mcg by mouth daily before breakfast. Takes  50 mcg      . losartan-hydrochlorothiazide (HYZAAR) 100-25 MG per tablet Take 1 tablet by mouth daily.  90 tablet  3  . metoprolol succinate (TOPROL-XL) 50 MG 24 hr tablet Take 1 tablet (50 mg total) by mouth daily. Take with or immediately following a meal.  90 tablet  3  . Multiple Vitamin (MULTIVITAMIN) tablet Take 1 tablet by mouth daily.        . nitroGLYCERIN (NITROSTAT) 0.4 MG SL tablet Place 1 tablet (0.4 mg total) under the tongue every 5 (five) minutes as needed for chest pain.  25 tablet  12  . Omega-3 Fatty Acids (FISH OIL) 1000 MG CAPS Take by mouth daily.        Marland Kitchen omeprazole (PRILOSEC) 40 MG capsule Take 40 mg by mouth daily.        Glory Rosebush VERIO test strip       . simvastatin (ZOCOR) 20 MG tablet Take 1 tablet (20 mg total) by mouth at bedtime.  90 tablet  3  . ZOSTAVAX 79892 UNT/0.65ML injection        No current facility-administered  medications for this visit.    Allergies  Allergen Reactions  . Alprazolam     No good  . Percocet [Oxycodone-Acetaminophen]     nightmares  . Septra Ds [Sulfamethoxazole-Trimethoprim]     rash    Patient Active Problem List   Diagnosis Date Noted  . Hx of CABG 03/21/2011    Priority: High  . Type II or unspecified type diabetes mellitus without mention of complication, uncontrolled 03/21/2011    Priority: High  . Benign hypertensive heart disease without heart failure 03/21/2011    Priority: Medium  . History of prostate cancer 03/21/2011    Priority: Medium  . Depression 04/30/2013  . Sleep apnea 04/30/2013  . S/P cholecystectomy 07/11/2011  . Renal colic 04/21/7627  . Cholecystitis chronic, acute 05/08/2011  . Dyslipidemia associated with type 2 diabetes mellitus 03/21/2011    History  Smoking status  . Never Smoker   Smokeless tobacco  . Not on file     History  Alcohol Use No    No family history on file.  Review of Systems: Constitutional: no fever chills diaphoresis or fatigue or change in weight.  Head and neck: no hearing loss, no epistaxis, no photophobia or visual disturbance. Respiratory: No cough, shortness of breath or wheezing. Cardiovascular: No chest pain peripheral edema, palpitations. Gastrointestinal: No abdominal distention, no abdominal pain, no change in bowel habits hematochezia or melena. Genitourinary: No dysuria, no frequency, no urgency, no nocturia. Musculoskeletal:No arthralgias, no back pain, no gait disturbance or myalgias. Neurological: No dizziness, no headaches, no numbness, no seizures, no syncope, no weakness, no tremors. Hematologic: No lymphadenopathy, no easy bruising. Psychiatric: No confusion, no hallucinations, no sleep disturbance.    Physical Exam: Filed Vitals:   01/18/14 0941  BP: 136/84  Pulse:    the general appearance reveals a well-developed mildly depressed appearing middle-aged gentleman in no distress.The head and neck exam reveals pupils equal and reactive.  Extraocular movements are full.  There is no scleral icterus.  The mouth and pharynx are normal.  The neck is supple.  The carotids reveal no bruits.  The jugular venous pressure is normal.  The  thyroid is not enlarged.  There is no lymphadenopathy.  The chest is clear to percussion and auscultation.  There are no rales or rhonchi.  Expansion of the chest is symmetrical.  The precordium is quiet.  The first heart sound is normal.  The second heart sound is physiologically split.  There is no murmur gallop rub or click.  There is no abnormal lift or heave.  The abdomen is soft and nontender.  The bowel sounds are normal.  The liver and spleen are not enlarged.  There are no abdominal masses.  There are no abdominal bruits.  Extremities reveal good pedal pulses.  There is no phlebitis or edema.  There is no cyanosis or clubbing.   Strength is normal and symmetrical in all extremities.  There is no lateralizing weakness.  There are no sensory deficits.  The skin is warm and dry.  There is no rash.  EKG today shows normal sinus rhythm with nonspecific T-wave flattening and is unchanged from 04/30/13  Assessment / Plan:  The patient is to continue same medication.  He will work harder on regular exercise and continued weight loss.  His weight is down 2 pounds today from last visit Recheck 4 months for office visit, , lipid panel hepatic function panel and basal metabolic panel.

## 2014-01-18 NOTE — Assessment & Plan Note (Signed)
Blood pressure today on arrival was high.  On recheck in improved.  It was down to 136/84.  He attributes the elevated blood pressure to having been under a lot of stress and rushing around today.  He will monitor his pressure and keep [place] posted.  We will not change his medicines today.  The patient is not having any headaches or dizzy spells.

## 2014-01-18 NOTE — Assessment & Plan Note (Signed)
As a consequence of his prostate cancer surgery, the patient has had some problems with urinary control and incontinence.

## 2015-02-16 ENCOUNTER — Telehealth: Payer: Self-pay | Admitting: Cardiology

## 2015-02-16 NOTE — Telephone Encounter (Signed)
New message      Talk to Rip Harbour about seeing the doctor earlier than his next appt available date

## 2015-02-16 NOTE — Telephone Encounter (Signed)
Scheduled appointment for patient  Will call Dr Cindra Eves office in am to see if lp/bmet/hfp can be added to his Friday labs

## 2015-02-17 NOTE — Telephone Encounter (Signed)
Left message at Dr Cindra Eves office this am and did not hear back.  Rx for labs given to wife at her ov today for husband to take tomorrow

## 2015-02-23 ENCOUNTER — Telehealth: Payer: Self-pay | Admitting: Cardiology

## 2015-02-23 NOTE — Telephone Encounter (Signed)
New message     Talk to a nurse regarding his labs

## 2015-02-23 NOTE — Telephone Encounter (Signed)
Spoke with pt and he states that he had spoken with Rip Harbour about having his labs drawn at Dr. Cindra Eves office instead and that [place] had arranged this. Pt calling stating that he was unable to get labs drawn at Dr. Cindra Eves office so he needed to make an appt to have them drawn here. Made appt for 02/28/15. Pt verbalized understanding and was in agreement with this plan.

## 2015-02-28 ENCOUNTER — Other Ambulatory Visit (INDEPENDENT_AMBULATORY_CARE_PROVIDER_SITE_OTHER): Payer: Commercial Managed Care - HMO | Admitting: *Deleted

## 2015-02-28 DIAGNOSIS — E1169 Type 2 diabetes mellitus with other specified complication: Secondary | ICD-10-CM

## 2015-02-28 DIAGNOSIS — I119 Hypertensive heart disease without heart failure: Secondary | ICD-10-CM | POA: Diagnosis not present

## 2015-02-28 DIAGNOSIS — E785 Hyperlipidemia, unspecified: Secondary | ICD-10-CM | POA: Diagnosis not present

## 2015-02-28 LAB — BASIC METABOLIC PANEL
BUN: 23 mg/dL (ref 6–23)
CHLORIDE: 102 meq/L (ref 96–112)
CO2: 26 mEq/L (ref 19–32)
CREATININE: 1.28 mg/dL (ref 0.40–1.50)
Calcium: 10.2 mg/dL (ref 8.4–10.5)
GFR: 59.19 mL/min — ABNORMAL LOW (ref >60.00)
GLUCOSE: 120 mg/dL — AB (ref 70–99)
Potassium: 4.3 mEq/L (ref 3.5–5.1)
Sodium: 137 mEq/L (ref 135–145)

## 2015-02-28 LAB — HEMOGLOBIN A1C: Hgb A1c MFr Bld: 7.3 % — ABNORMAL HIGH (ref 4.6–6.5)

## 2015-02-28 LAB — LDL CHOLESTEROL, DIRECT: Direct LDL: 76 mg/dL

## 2015-02-28 LAB — HEPATIC FUNCTION PANEL
ALK PHOS: 33 U/L — AB (ref 39–117)
ALT: 28 U/L (ref 0–53)
AST: 31 U/L (ref 0–37)
Albumin: 4 g/dL (ref 3.5–5.2)
BILIRUBIN TOTAL: 0.4 mg/dL (ref 0.2–1.2)
Bilirubin, Direct: 0.1 mg/dL (ref 0.0–0.3)
TOTAL PROTEIN: 7.5 g/dL (ref 6.0–8.3)

## 2015-02-28 LAB — LIPID PANEL
CHOL/HDL RATIO: 5
Cholesterol: 139 mg/dL (ref 0–200)
HDL: 30 mg/dL — AB (ref >39.00)
NONHDL: 109
TRIGLYCERIDES: 279 mg/dL — AB (ref 0.0–149.0)
VLDL: 55.8 mg/dL — AB (ref 0.0–40.0)

## 2015-02-28 NOTE — Addendum Note (Signed)
Addended by: Eulis Foster on: 02/28/2015 09:39 AM   Modules accepted: Orders

## 2015-03-01 NOTE — Progress Notes (Signed)
Quick Note:  Please make copy of labs for patient visit. ______ 

## 2015-03-02 ENCOUNTER — Encounter: Payer: Self-pay | Admitting: Cardiology

## 2015-03-02 ENCOUNTER — Ambulatory Visit (INDEPENDENT_AMBULATORY_CARE_PROVIDER_SITE_OTHER): Payer: Commercial Managed Care - HMO | Admitting: Cardiology

## 2015-03-02 VITALS — BP 130/78 | HR 78 | Ht 68.0 in | Wt 212.0 lb

## 2015-03-02 DIAGNOSIS — I119 Hypertensive heart disease without heart failure: Secondary | ICD-10-CM

## 2015-03-02 DIAGNOSIS — I259 Chronic ischemic heart disease, unspecified: Secondary | ICD-10-CM

## 2015-03-02 DIAGNOSIS — E7800 Pure hypercholesterolemia, unspecified: Secondary | ICD-10-CM

## 2015-03-02 DIAGNOSIS — G4730 Sleep apnea, unspecified: Secondary | ICD-10-CM

## 2015-03-02 NOTE — Progress Notes (Signed)
Cardiology Office Note   Date:  03/02/2015   ID:  Paul [name], DOB 06/13/1946, MRN [id number]  PCP:  [name], MD  Cardiologist: Darlin Coco MD  No chief complaint on file.     History of Present Illness: Paul [name] is a 69 y.o. male who presents for a six-month follow-up office visit  This pleasant 69 year old gentleman is seen for a scheduled followup office visit. He has a history of diabetes mellitus, coronary heart disease with prior coronary bypass graft surgery, essential hypertension, and hypercholesterolemia. His last stress test was on 06/13/12. He underwent a treadmill Myoview but had to be switched to Lexa scan because he was unable to reach target heart rate. 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 His overall ejection fraction was 60% with mild inferior wall hypokinesis. He is status post cholecystectomy. He also has a past history of prostate cancer in the past history of kidney stones. Dr. Alinda Money is his urologist. Since we last saw him he has been diagnosed with depression by his PCP Dr. Addison Lank. His symptoms of depression and responded to Lexapro. He has also been diagnosed with sleep apnea by Dr. Nehemiah Settle and now wears a CPAP machine. Dr. Buddy Duty is his endocrinologist and follows his thyroid function and his diabetes. From a cardiac standpoint the patient has been feeling well.  No chest pain.  He has not been getting as much regular exercise as he would like.  His blood pressure today is stable.  He is requesting a home blood pressure cuff for home use to monitor his blood pressure.  He would like to get off some of his medications.  We are going to try leaving off his amlodipine temporarily and he will monitor his blood pressure and if his blood pressure starts to go up he will resume his amlodipine.  Past Medical History  Diagnosis Date  . Hypertension   . Coronary artery disease   .  Diabetes mellitus   . Hyperlipidemia   . GERD (gastroesophageal reflux disease)   . Allergy     Past Surgical History  Procedure Laterality Date  . Coronary artery bypass graft    . Cholecystectomy    . Prostate surgery    . Joint replacement      right knee     Current Outpatient Prescriptions  Medication Sig Dispense Refill  . amLODipine (NORVASC) 5 MG tablet Take 1 tablet (5 mg total) by mouth daily. 90 tablet 3  . aspirin 325 MG tablet Take 325 mg by mouth daily.      . clindamycin (CLEOCIN) 300 MG capsule Take 300 mg by mouth. Only prior to dental procedures    . Colestipol HCl (COLESTID PO) Take 1 g by mouth daily.     Marland Kitchen EPINEPHrine (EPIPEN) 0.3 mg/0.3 mL DEVI Inject 0.3 mLs (0.3 mg total) into the muscle once. 1 Device PRN  . escitalopram (LEXAPRO) 10 MG tablet Take 10 mg by mouth daily.    . fluticasone (FLONASE) 50 MCG/ACT nasal spray Place 2 sprays into the nose daily. 16 g PRN  . GuaiFENesin (MUCINEX PO) Take 600 mg by mouth daily as needed (cough and congestion). As needed    . levothyroxine (SYNTHROID, LEVOTHROID) 88 MCG tablet Take 1 tablet by mouth daily.    Marland Kitchen losartan-hydrochlorothiazide (HYZAAR) 100-25 MG per tablet Take 1 tablet by mouth daily. 90 tablet 3  . metFORMIN (GLUCOPHAGE) 1000 MG tablet Take 1,000  mg by mouth 2 (two) times daily.    . metoprolol succinate (TOPROL-XL) 50 MG 24 hr tablet Take 1 tablet (50 mg total) by mouth daily. Take with or immediately following a meal. 90 tablet 3  . MICRONIZED COLESTIPOL HCL 1 G tablet Take 1 tablet by mouth daily.    . Multiple Vitamin (MULTIVITAMIN) tablet Take 1 tablet by mouth daily.      . nitroGLYCERIN (NITROSTAT) 0.4 MG SL tablet Place 1 tablet (0.4 mg total) under the tongue every 5 (five) minutes as needed for chest pain. 25 tablet 12  . Omega-3 Fatty Acids (FISH OIL) 1000 MG CAPS Take 1 capsule by mouth daily.     Marland Kitchen omeprazole (PRILOSEC) 40 MG capsule Take 40 mg by mouth daily.      Glory Rosebush VERIO test  strip     . potassium citrate (UROCIT-K) 10 MEQ (1080 MG) SR tablet Take 2 tablets by mouth 2 (two) times daily.    . simvastatin (ZOCOR) 20 MG tablet Take 1 tablet (20 mg total) by mouth at bedtime. 90 tablet 3  . VICTOZA 18 MG/3ML SOPN Inject 1.2 mg into the skin daily.     No current facility-administered medications for this visit.    Allergies:   Alprazolam; Penicillins; Percocet; and Septra ds    Social History:  The patient  reports that he has never smoked. He does not have any smokeless tobacco history on file. He reports that he does not drink alcohol.   Family History:  The patient's family history includes Heart attack in his father; Heart disease in his mother; Vascular Disease in his sister.    ROS:  Please see the history of present illness.   Otherwise, review of systems are positive for none.   All other systems are reviewed and negative.    PHYSICAL EXAM: VS:  BP 130/78 mmHg  Pulse 78  Ht 5\' 8"  (1.727 m)  Wt 212 lb (96.163 kg)  BMI 32.24 kg/m2 , BMI Body mass index is 32.24 kg/(m^2). GEN: Well nourished, well developed, in no acute distress HEENT: normal Neck: no JVD, carotid bruits, or masses Cardiac: RRR; no murmurs, rubs, or gallops,no edema  Respiratory:  clear to auscultation bilaterally, normal work of breathing GI: soft, nontender, nondistended, + BS MS: no deformity or atrophy Skin: warm and dry, no rash Neuro:  Strength and sensation are intact Psych: euthymic mood, full affect   EKG:  EKG is ordered today. The ekg ordered today demonstrates normal sinus rhythm.  Nonspecific T-wave flattening.  No significant change since 01/18/14.   Recent Labs: 02/28/2015: ALT 28; BUN 23; Creatinine 1.28; Potassium 4.3; Sodium 137    Lipid Panel    Component Value Date/Time   CHOL 139 02/28/2015 0939   TRIG 279.0* 02/28/2015 0939   HDL 30.00* 02/28/2015 0939   CHOLHDL 5 02/28/2015 0939   VLDL 55.8* 02/28/2015 0939   LDLCALC 62 01/14/2014 0911   LDLDIRECT  76.0 02/28/2015 0939      Wt Readings from Last 3 Encounters:  03/02/15 212 lb (96.163 kg)  01/18/14 221 lb (100.245 kg)  09/11/13 223 lb (101.152 kg)        ASSESSMENT AND PLAN: 1.  Essential hypertension. 2.  Ischemic heart disease status post CABG. 3.  Hypothyroidism followed by Dr. Buddy Duty 4.  Diabetes mellitus followed by Dr. Buddy Duty 5.  History of depression followed by Dr. Leonides Schanz 6.  History of sleep apnea followed by Dr. Maxwell Caul 7.  History of prostate  cancer and kidney stones followed by Dr. Alinda Money  Disposition: Asian is to continue on same medications.  He will try leaving off his amlodipine and he will monitor his blood pressure at home.  If his pressure goes up he will resume the amlodipine which will still be on his list. Recheck here in 6 months for follow-up office visit lipid panel hepatic function panel and basal metabolic panel.  Current medicines are reviewed at length with the patient today.  The patient does not have concerns regarding medicines.  The following changes have been made:  no change  Labs/ tests ordered today include:   Orders Placed This Encounter  Procedures  . Lipid panel  . Hepatic function panel  . Basic metabolic panel  . EKG 12-Lead       Signed, Darlin Coco MD 03/02/2015 [facility] [place], [place], [place]  [address] Phone: [phone]; Fax: [phone]

## 2015-03-02 NOTE — Patient Instructions (Signed)
Medication Instructions:  Your physician recommends that you continue on your current medications as directed. Please refer to the Current Medication list given to you today.  Labwork: none  Testing/Procedures: none  Follow-Up: Your physician wants you to follow-up in: 6 months with fasting labs (lp/bmet/hfp) You will receive a reminder letter in the mail two months in advance. If you don't receive a letter, please call our office to schedule the follow-up appointment.   Any Other Special Instructions Will Be Listed Below (If Applicable). Get home blood pressure machine ([facility]) and monitor at home  Work harder on exercise

## 2015-04-21 ENCOUNTER — Other Ambulatory Visit: Payer: Self-pay | Admitting: Cardiology

## 2015-05-02 ENCOUNTER — Other Ambulatory Visit: Payer: Self-pay | Admitting: Cardiology

## 2015-05-04 ENCOUNTER — Other Ambulatory Visit: Payer: Self-pay

## 2015-05-04 MED ORDER — LOSARTAN POTASSIUM-HCTZ 100-25 MG PO TABS: 1.0000 | ORAL_TABLET | Freq: Every day | ORAL | Status: DC

## 2015-05-07 ENCOUNTER — Other Ambulatory Visit: Payer: Self-pay | Admitting: Cardiology

## 2015-05-11 ENCOUNTER — Other Ambulatory Visit: Payer: Self-pay | Admitting: Cardiology

## 2015-05-12 NOTE — Telephone Encounter (Signed)
Paul Coco, MD at 03/02/2015 3:00 PM  amLODipine (NORVASC) 5 MG tabletTake 1 tablet (5 mg total) by mouth daily Disposition: Asian is to continue on same medications. He will try leaving off his amlodipine and he will monitor his blood pressure at home. If his pressure goes up he will resume the amlodipine which will still be on his list. Recheck here in 6 months for follow-up office visit lipid panel hepatic function panel and basal metabolic panel.

## 2015-05-13 NOTE — Telephone Encounter (Signed)
Spoke with patient and he did d/c Amlodipine for a short while but is now back taking daily Will send Rx to pharmacy as requested

## 2015-05-27 ENCOUNTER — Other Ambulatory Visit: Payer: Self-pay | Admitting: Cardiology

## 2015-08-31 ENCOUNTER — Other Ambulatory Visit: Payer: Self-pay | Admitting: Cardiology

## 2015-09-08 ENCOUNTER — Other Ambulatory Visit (INDEPENDENT_AMBULATORY_CARE_PROVIDER_SITE_OTHER): Payer: Commercial Managed Care - HMO | Admitting: *Deleted

## 2015-09-08 DIAGNOSIS — I119 Hypertensive heart disease without heart failure: Secondary | ICD-10-CM

## 2015-09-08 DIAGNOSIS — E1169 Type 2 diabetes mellitus with other specified complication: Secondary | ICD-10-CM

## 2015-09-08 DIAGNOSIS — Z8546 Personal history of malignant neoplasm of prostate: Secondary | ICD-10-CM

## 2015-09-08 DIAGNOSIS — Z951 Presence of aortocoronary bypass graft: Secondary | ICD-10-CM

## 2015-09-08 DIAGNOSIS — E785 Hyperlipidemia, unspecified: Secondary | ICD-10-CM

## 2015-09-08 LAB — HEPATIC FUNCTION PANEL
ALT: 22 U/L (ref 9–46)
AST: 29 U/L (ref 10–35)
Albumin: 4 g/dL (ref 3.6–5.1)
Alkaline Phosphatase: 32 U/L — ABNORMAL LOW (ref 40–115)
BILIRUBIN INDIRECT: 0.5 mg/dL (ref 0.2–1.2)
Bilirubin, Direct: 0.1 mg/dL (ref <=0.2)
TOTAL PROTEIN: 7.1 g/dL (ref 6.1–8.1)
Total Bilirubin: 0.6 mg/dL (ref 0.2–1.2)

## 2015-09-08 LAB — BASIC METABOLIC PANEL
BUN: 21 mg/dL (ref 7–25)
CHLORIDE: 100 mmol/L (ref 98–110)
CO2: 29 mmol/L (ref 20–31)
Calcium: 9.5 mg/dL (ref 8.6–10.3)
Creat: 1.13 mg/dL (ref 0.70–1.25)
Glucose, Bld: 172 mg/dL — ABNORMAL HIGH (ref 65–99)
POTASSIUM: 4.3 mmol/L (ref 3.5–5.3)
Sodium: 137 mmol/L (ref 135–146)

## 2015-09-08 LAB — LIPID PANEL
CHOL/HDL RATIO: 4.9 ratio (ref <=5.0)
Cholesterol: 138 mg/dL (ref 125–200)
HDL: 28 mg/dL — ABNORMAL LOW (ref >=40)
LDL CALC: 55 mg/dL (ref <130)
TRIGLYCERIDES: 275 mg/dL — AB (ref <150)
VLDL: 55 mg/dL — AB (ref <30)

## 2015-09-08 NOTE — Addendum Note (Signed)
Addended by: Eulis Foster on: 09/08/2015 09:32 AM   Modules accepted: Orders

## 2015-09-09 NOTE — Progress Notes (Signed)
Quick Note:  Please make copy of labs for patient visit. ______ 

## 2015-09-12 ENCOUNTER — Ambulatory Visit (INDEPENDENT_AMBULATORY_CARE_PROVIDER_SITE_OTHER): Payer: Commercial Managed Care - HMO | Admitting: Cardiology

## 2015-09-12 ENCOUNTER — Encounter: Payer: Self-pay | Admitting: Cardiology

## 2015-09-12 VITALS — BP 118/80 | HR 82 | Ht 68.0 in | Wt 217.8 lb

## 2015-09-12 DIAGNOSIS — I259 Chronic ischemic heart disease, unspecified: Secondary | ICD-10-CM

## 2015-09-12 DIAGNOSIS — I119 Hypertensive heart disease without heart failure: Secondary | ICD-10-CM | POA: Diagnosis not present

## 2015-09-12 DIAGNOSIS — R0609 Other forms of dyspnea: Secondary | ICD-10-CM | POA: Diagnosis not present

## 2015-09-12 DIAGNOSIS — R0789 Other chest pain: Secondary | ICD-10-CM

## 2015-09-12 MED ORDER — METOPROLOL SUCCINATE ER 50 MG PO TB24: ORAL_TABLET | ORAL | Status: DC

## 2015-09-12 MED ORDER — SIMVASTATIN 20 MG PO TABS: 20.0000 mg | ORAL_TABLET | Freq: Every day | ORAL | Status: DC

## 2015-09-12 NOTE — Progress Notes (Signed)
Cardiology Office Note   Date:  09/12/2015   ID:  Paul [name], DOB Feb 28, 1946, MRN [id number]  PCP:  [name], MD  Cardiologist: Darlin Coco MD  No chief complaint on file.     History of Present Illness: Paul [name] is a 69 y.o. male who presents for a six-month follow-up visit  This pleasant 69 year old gentleman is seen for a scheduled followup office visit. He has a history of diabetes mellitus, coronary heart disease with prior coronary bypass graft surgery, essential hypertension, and hypercholesterolemia. His last stress test was on 06/13/12. He underwent a treadmill Myoview but had to be switched to Lexa scan because he was unable to reach target heart rate. 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 His overall ejection fraction was 60% with mild inferior wall hypokinesis. He is status post cholecystectomy. He also has a past history of prostate cancer in the past history of kidney stones. Dr. Alinda Money is his urologist. Since we last saw him he has been diagnosed with depression by his PCP Dr. Addison Lank. His symptoms of depression and responded to Lexapro. He has also been diagnosed with sleep apnea by Dr. Nehemiah Settle and now wears a CPAP machine. Dr. Buddy Duty is his endocrinologist and follows his thyroid function and his diabetes. From a cardiac standpoint the patient has been feeling fair.  He has been concerned that he seems to be having more shortness of breath and has had some precordial chest tightness with emotional stress or physical stress such as pulling in the garbage cans from the street.  He has taken sublingual nitroglycerin 2 several months ago.  Over the course of the summer he was not able to walk much because of a right thigh injury.  His thigh is now doing well after chiropractic treatment.  His blood pressure today is stable.  Past Medical History  Diagnosis Date  . Hypertension   .  Coronary artery disease   . Diabetes mellitus   . Hyperlipidemia   . GERD (gastroesophageal reflux disease)   . Allergy     Past Surgical History  Procedure Laterality Date  . Coronary artery bypass graft    . Cholecystectomy    . Prostate surgery    . Joint replacement      right knee     Current Outpatient Prescriptions  Medication Sig Dispense Refill  . amLODipine (NORVASC) 5 MG tablet TAKE ONE TABLET BY MOUTH ONCE DAILY 90 tablet 3  . aspirin 325 MG tablet Take 325 mg by mouth daily.      . clindamycin (CLEOCIN) 300 MG capsule Take 300 mg by mouth. Only prior to dental procedures    . Colestipol HCl (COLESTID PO) Take 1 g by mouth daily.     . DULoxetine (CYMBALTA) 60 MG capsule Take 60 mg by mouth daily.    Marland Kitchen EPINEPHrine (EPIPEN) 0.3 mg/0.3 mL DEVI Inject 0.3 mLs (0.3 mg total) into the muscle once. 1 Device PRN  . fluticasone (FLONASE) 50 MCG/ACT nasal spray Place 2 sprays into the nose daily. 16 g PRN  . GuaiFENesin (MUCINEX PO) Take 600 mg by mouth daily as needed (cough and congestion). As needed    . levothyroxine (SYNTHROID, LEVOTHROID) 75 MCG tablet Take 75 mcg by mouth daily.    Marland Kitchen liothyronine (CYTOMEL) 5 MCG tablet Take 5 mcg by mouth daily.    Marland Kitchen losartan-hydrochlorothiazide (HYZAAR) 100-25 MG per tablet Take 1 tablet by mouth daily.  90 tablet 0  . metFORMIN (GLUCOPHAGE) 1000 MG tablet Take 1,000 mg by mouth 2 (two) times daily.    . metoprolol succinate (TOPROL-XL) 50 MG 24 hr tablet TAKE ONE TABLET BY MOUTH ONCE DAILY TAKE WITH OR IMMEDIATELY FOLLOWING A MEAL 90 tablet 3  . MICRONIZED COLESTIPOL HCL 1 G tablet Take 1 tablet by mouth daily.    . Multiple Vitamin (MULTIVITAMIN) tablet Take 1 tablet by mouth daily.      . nitroGLYCERIN (NITROSTAT) 0.4 MG SL tablet Place 1 tablet (0.4 mg total) under the tongue every 5 (five) minutes as needed for chest pain. 25 tablet 12  . omeprazole (PRILOSEC) 40 MG capsule Take 40 mg by mouth daily.      Glory Rosebush VERIO test strip      . potassium citrate (UROCIT-K) 10 MEQ (1080 MG) SR tablet Take 2 tablets by mouth 2 (two) times daily.    . simvastatin (ZOCOR) 20 MG tablet Take 1 tablet (20 mg total) by mouth at bedtime. 90 tablet 3   No current facility-administered medications for this visit.    Allergies:   Alprazolam; Penicillins; Percocet; and Septra ds    Social History:  The patient  reports that he has never smoked. He does not have any smokeless tobacco history on file. He reports that he does not drink alcohol.   Family History:  The patient's family history includes Heart attack in his father; Heart disease in his mother; Vascular Disease in his sister.    ROS:  Please see the history of present illness.   Otherwise, review of systems are positive for none.   All other systems are reviewed and negative.  The patient complains of excessive fatigue.  He had to take a sabbatical from work because of the fatigue.   PHYSICAL EXAM: VS:  BP 118/80 mmHg  Pulse 82  Ht 5\' 8"  (1.727 m)  Wt 217 lb 12.8 oz (98.793 kg)  BMI 33.12 kg/m2 , BMI Body mass index is 33.12 kg/(m^2). GEN: Well nourished, well developed, in no acute distress HEENT: normal Neck: no JVD, carotid bruits, or masses Cardiac: RRR; no murmurs, rubs, or gallops,no edema  Respiratory:  clear to auscultation bilaterally, normal work of breathing GI: soft, nontender, nondistended, + BS MS: no deformity or atrophy Skin: warm and dry, no rash Neuro:  Strength and sensation are intact Psych: euthymic mood, full affect   EKG:  EKG is ordered today. The ekg ordered today demonstrates normal sinus rhythm.  Old inferior wall myocardial infarction.  Since prior tracing of 03/02/15, no significant change.   Recent Labs: 09/08/2015: ALT 22; BUN 21; Creat 1.13; Potassium 4.3; Sodium 137    Lipid Panel    Component Value Date/Time   CHOL 138 09/08/2015 0933   TRIG 275* 09/08/2015 0933   HDL 28* 09/08/2015 0933   CHOLHDL 4.9 09/08/2015 0933    VLDL 55* 09/08/2015 0933   LDLCALC 55 09/08/2015 0933   LDLDIRECT 76.0 02/28/2015 0939      Wt Readings from Last 3 Encounters:  09/12/15 217 lb 12.8 oz (98.793 kg)  03/02/15 212 lb (96.163 kg)  01/18/14 221 lb (100.245 kg)         ASSESSMENT AND PLAN:  1. Essential hypertension. 2. Ischemic heart disease status post CABG. 3. Hypothyroidism followed by Dr. Buddy Duty 4. Diabetes mellitus followed by Dr. Buddy Duty 5. History of depression followed by Dr. Leonides Schanz 6. History of sleep apnea followed by Dr. Maxwell Caul 7. History of prostate cancer  and kidney stones followed by Dr. Alinda Money 8. Dyspnea on exertion. 9. Chest pain with exertion.  Current medicines are reviewed at length with the patient today.  The patient does not have concerns regarding medicines.  The following changes have been made:  no change  Labs/ tests ordered today include:   Orders Placed This Encounter  Procedures  . Myocardial Perfusion Imaging  . EKG 12-Lead    Disposition: To evaluate his exertional dyspnea and chest tightness we will have him return for a treadmill Myoview stress test soon.  Continue current medication.  Recheck in 6 months for office visit with Dr. Martinique and 4 lipid panel hepatic function panel and basal metabolic panel  Signed, Darlin Coco MD 09/12/2015 2:55 PM    [address], [place], [place]  [address] Phone: [phone]; Fax: [phone]

## 2015-09-12 NOTE — Patient Instructions (Signed)
Medication Instructions:  Your physician recommends that you continue on your current medications as directed. Please refer to the Current Medication list given to you today.  Labwork: NONE  Testing/Procedures: Your physician has requested that you have en exercise stress myoview. For further information please visit [email or web address]. Please follow instruction sheet, as given.  Follow-Up: Your physician wants you to follow-up in: 6 months with fasting labs (lp/bmet/hfp) WITH DR Martinique  You will receive a reminder letter in the mail two months in advance. If you don't receive a letter, please call our office to schedule the follow-up appointment.  If you need a refill on your cardiac medications before your next appointment, please call your pharmacy.

## 2015-09-13 ENCOUNTER — Ambulatory Visit (HOSPITAL_COMMUNITY): Payer: Commercial Managed Care - HMO | Attending: Cardiology

## 2015-09-13 DIAGNOSIS — R0609 Other forms of dyspnea: Secondary | ICD-10-CM | POA: Diagnosis not present

## 2015-09-13 DIAGNOSIS — I10 Essential (primary) hypertension: Secondary | ICD-10-CM | POA: Insufficient documentation

## 2015-09-13 DIAGNOSIS — R0789 Other chest pain: Secondary | ICD-10-CM

## 2015-09-13 DIAGNOSIS — I259 Chronic ischemic heart disease, unspecified: Secondary | ICD-10-CM | POA: Diagnosis not present

## 2015-09-13 DIAGNOSIS — R9439 Abnormal result of other cardiovascular function study: Secondary | ICD-10-CM | POA: Diagnosis not present

## 2015-09-13 DIAGNOSIS — E119 Type 2 diabetes mellitus without complications: Secondary | ICD-10-CM | POA: Insufficient documentation

## 2015-09-13 LAB — MYOCARDIAL PERFUSION IMAGING
CHL CUP NUCLEAR SDS: 5
CHL CUP NUCLEAR SRS: 16
CHL CUP NUCLEAR SSS: 21
CSEPED: 7 min
CSEPHR: 100 %
CSEPPHR: 151 {beats}/min
Estimated workload: 9.7 METS
Exercise duration (sec): 45 s
LHR: 0.28
LV dias vol: 71 mL
LVSYSVOL: 31 mL
MPHR: 151 {beats}/min
Rest HR: 77 {beats}/min
TID: 0.96

## 2015-09-13 MED ORDER — TECHNETIUM TC 99M SESTAMIBI GENERIC - CARDIOLITE
31.9000 | Freq: Once | INTRAVENOUS | Status: AC | PRN
  Administered 2015-09-13: 31.9 via INTRAVENOUS

## 2015-09-13 MED ORDER — TECHNETIUM TC 99M SESTAMIBI GENERIC - CARDIOLITE
10.7000 | Freq: Once | INTRAVENOUS | Status: AC | PRN
  Administered 2015-09-13: 11 via INTRAVENOUS

## 2015-09-15 ENCOUNTER — Other Ambulatory Visit (INDEPENDENT_AMBULATORY_CARE_PROVIDER_SITE_OTHER): Payer: Commercial Managed Care - HMO | Admitting: *Deleted

## 2015-09-15 ENCOUNTER — Ambulatory Visit
Admission: RE | Admit: 2015-09-15 | Discharge: 2015-09-15 | Disposition: A | Discharge status: Home / Self Care | Payer: Commercial Managed Care - HMO | Source: Ambulatory Visit | Attending: Cardiology | Admitting: Cardiology

## 2015-09-15 ENCOUNTER — Telehealth: Payer: Self-pay | Admitting: *Deleted

## 2015-09-15 ENCOUNTER — Encounter: Payer: Self-pay | Admitting: *Deleted

## 2015-09-15 ENCOUNTER — Other Ambulatory Visit: Payer: Self-pay | Admitting: Cardiology

## 2015-09-15 DIAGNOSIS — Z01818 Encounter for other preprocedural examination: Secondary | ICD-10-CM

## 2015-09-15 DIAGNOSIS — I259 Chronic ischemic heart disease, unspecified: Secondary | ICD-10-CM

## 2015-09-15 LAB — CBC WITH DIFFERENTIAL/PLATELET
BASOS ABS: 0 10*3/uL (ref 0.0–0.1)
Basophils Relative: 0 % (ref 0–1)
EOS ABS: 0.1 10*3/uL (ref 0.0–0.7)
Eosinophils Relative: 2 % (ref 0–5)
HCT: 41.9 % (ref 39.0–52.0)
HEMOGLOBIN: 13.8 g/dL (ref 13.0–17.0)
LYMPHS ABS: 2.2 10*3/uL (ref 0.7–4.0)
Lymphocytes Relative: 36 % (ref 12–46)
MCH: 30.1 pg (ref 26.0–34.0)
MCHC: 32.9 g/dL (ref 30.0–36.0)
MCV: 91.3 fL (ref 78.0–100.0)
MONOS PCT: 9 % (ref 3–12)
MPV: 10 fL (ref 8.6–12.4)
Monocytes Absolute: 0.5 10*3/uL (ref 0.1–1.0)
NEUTROS ABS: 3.2 10*3/uL (ref 1.7–7.7)
NEUTROS PCT: 53 % (ref 43–77)
PLATELETS: 209 10*3/uL (ref 150–400)
RBC: 4.59 MIL/uL (ref 4.22–5.81)
RDW: 15.2 % (ref 11.5–15.5)
WBC: 6.1 10*3/uL (ref 4.0–10.5)

## 2015-09-15 LAB — BASIC METABOLIC PANEL
BUN: 22 mg/dL (ref 7–25)
CHLORIDE: 97 mmol/L — AB (ref 98–110)
CO2: 22 mmol/L (ref 20–31)
CREATININE: 1.25 mg/dL (ref 0.70–1.25)
Calcium: 10.1 mg/dL (ref 8.6–10.3)
Glucose, Bld: 316 mg/dL — ABNORMAL HIGH (ref 65–99)
POTASSIUM: 4.8 mmol/L (ref 3.5–5.3)
SODIUM: 134 mmol/L — AB (ref 135–146)

## 2015-09-15 NOTE — Telephone Encounter (Signed)
Advised patient of results and scheduled cath for 09/19/15 with Dr Martinique

## 2015-09-15 NOTE — Telephone Encounter (Signed)
-----   Message from Darlin Coco, MD sent at 09/14/2015  1:06 PM EST ----- Please report.  The stress test was an intermediate risk study.  The ejection fraction was normal.  However there was a new area of peri-infarct ischemia in the inferolateral area not present in 2013.  He is having more symptoms.  We should arrange for left heart cardiac catheterization.

## 2015-09-16 LAB — APTT: APTT: 26 s (ref 24–37)

## 2015-09-16 LAB — PROTIME-INR
INR: 0.99 (ref <1.50)
Prothrombin Time: 13.2 seconds (ref 11.6–15.2)

## 2015-09-19 ENCOUNTER — Ambulatory Visit (HOSPITAL_COMMUNITY)
Admission: RE | Admit: 2015-09-19 | Discharge: 2015-09-20 | Disposition: A | Discharge status: Home / Self Care | Payer: Commercial Managed Care - HMO | Source: Ambulatory Visit | Attending: Cardiology | Admitting: Cardiology

## 2015-09-19 ENCOUNTER — Encounter (HOSPITAL_COMMUNITY): Payer: Self-pay | Admitting: Cardiology

## 2015-09-19 ENCOUNTER — Encounter (HOSPITAL_COMMUNITY)
Admission: RE | Disposition: A | Discharge status: Home / Self Care | Payer: Commercial Managed Care - HMO | Source: Ambulatory Visit | Attending: Cardiology

## 2015-09-19 DIAGNOSIS — E119 Type 2 diabetes mellitus without complications: Secondary | ICD-10-CM | POA: Insufficient documentation

## 2015-09-19 DIAGNOSIS — E7800 Pure hypercholesterolemia, unspecified: Secondary | ICD-10-CM | POA: Insufficient documentation

## 2015-09-19 DIAGNOSIS — I25119 Atherosclerotic heart disease of native coronary artery with unspecified angina pectoris: Secondary | ICD-10-CM | POA: Diagnosis not present

## 2015-09-19 DIAGNOSIS — R944 Abnormal results of kidney function studies: Secondary | ICD-10-CM | POA: Diagnosis not present

## 2015-09-19 DIAGNOSIS — Z7951 Long term (current) use of inhaled steroids: Secondary | ICD-10-CM | POA: Insufficient documentation

## 2015-09-19 DIAGNOSIS — Z8546 Personal history of malignant neoplasm of prostate: Secondary | ICD-10-CM | POA: Insufficient documentation

## 2015-09-19 DIAGNOSIS — Z79899 Other long term (current) drug therapy: Secondary | ICD-10-CM | POA: Diagnosis not present

## 2015-09-19 DIAGNOSIS — I119 Hypertensive heart disease without heart failure: Secondary | ICD-10-CM | POA: Diagnosis not present

## 2015-09-19 DIAGNOSIS — E1169 Type 2 diabetes mellitus with other specified complication: Secondary | ICD-10-CM | POA: Diagnosis present

## 2015-09-19 DIAGNOSIS — I252 Old myocardial infarction: Secondary | ICD-10-CM | POA: Insufficient documentation

## 2015-09-19 DIAGNOSIS — F329 Major depressive disorder, single episode, unspecified: Secondary | ICD-10-CM | POA: Insufficient documentation

## 2015-09-19 DIAGNOSIS — E785 Hyperlipidemia, unspecified: Secondary | ICD-10-CM

## 2015-09-19 DIAGNOSIS — I2582 Chronic total occlusion of coronary artery: Secondary | ICD-10-CM | POA: Insufficient documentation

## 2015-09-19 DIAGNOSIS — Z7984 Long term (current) use of oral hypoglycemic drugs: Secondary | ICD-10-CM | POA: Diagnosis not present

## 2015-09-19 DIAGNOSIS — I25719 Atherosclerosis of autologous vein coronary artery bypass graft(s) with unspecified angina pectoris: Secondary | ICD-10-CM | POA: Insufficient documentation

## 2015-09-19 DIAGNOSIS — I259 Chronic ischemic heart disease, unspecified: Secondary | ICD-10-CM

## 2015-09-19 DIAGNOSIS — E039 Hypothyroidism, unspecified: Secondary | ICD-10-CM | POA: Insufficient documentation

## 2015-09-19 DIAGNOSIS — Z7982 Long term (current) use of aspirin: Secondary | ICD-10-CM | POA: Insufficient documentation

## 2015-09-19 DIAGNOSIS — G4730 Sleep apnea, unspecified: Secondary | ICD-10-CM | POA: Insufficient documentation

## 2015-09-19 DIAGNOSIS — K219 Gastro-esophageal reflux disease without esophagitis: Secondary | ICD-10-CM | POA: Insufficient documentation

## 2015-09-19 DIAGNOSIS — Z955 Presence of coronary angioplasty implant and graft: Secondary | ICD-10-CM

## 2015-09-19 DIAGNOSIS — Z951 Presence of aortocoronary bypass graft: Secondary | ICD-10-CM

## 2015-09-19 DIAGNOSIS — R9439 Abnormal result of other cardiovascular function study: Secondary | ICD-10-CM | POA: Diagnosis present

## 2015-09-19 DIAGNOSIS — I209 Angina pectoris, unspecified: Secondary | ICD-10-CM | POA: Diagnosis present

## 2015-09-19 HISTORY — PX: CARDIAC CATHETERIZATION: SHX172

## 2015-09-19 LAB — GLUCOSE, CAPILLARY
GLUCOSE-CAPILLARY: 179 mg/dL — AB (ref 65–99)
GLUCOSE-CAPILLARY: 199 mg/dL — AB (ref 65–99)
Glucose-Capillary: 219 mg/dL — ABNORMAL HIGH (ref 65–99)

## 2015-09-19 LAB — POCT ACTIVATED CLOTTING TIME
ACTIVATED CLOTTING TIME: 515 s
ACTIVATED CLOTTING TIME: 202 s
Activated Clotting Time: 233 seconds
Activated Clotting Time: 282 seconds

## 2015-09-19 SURGERY — CORONARY STENT INTERVENTION

## 2015-09-19 MED ORDER — ONDANSETRON HCL 4 MG/2ML IJ SOLN: 4.0000 mg | Freq: Four times a day (QID) | INTRAMUSCULAR | Status: DC | PRN

## 2015-09-19 MED ORDER — COLESTIPOL HCL 1 G PO TABS
1.0000 g | ORAL_TABLET | Freq: Every day | ORAL | Status: DC
  Filled 2015-09-19 (×3): qty 1

## 2015-09-19 MED ORDER — SODIUM CHLORIDE 0.9 % WEIGHT BASED INFUSION: 1.0000 mL/kg/h | INTRAVENOUS | Status: DC

## 2015-09-19 MED ORDER — LIDOCAINE HCL (PF) 1 % IJ SOLN
INTRAMUSCULAR | Status: AC
  Filled 2015-09-19: qty 30

## 2015-09-19 MED ORDER — CYANOCOBALAMIN 250 MCG PO TABS
250.0000 ug | ORAL_TABLET | Freq: Every day | ORAL | Status: DC
  Administered 2015-09-20: 12:00:00 250 ug via ORAL
  Filled 2015-09-19: qty 1

## 2015-09-19 MED ORDER — TICAGRELOR 90 MG PO TABS
90.0000 mg | ORAL_TABLET | Freq: Two times a day (BID) | ORAL | Status: DC
  Administered 2015-09-19 – 2015-09-20 (×2): 90 mg via ORAL
  Filled 2015-09-19 (×2): qty 1

## 2015-09-19 MED ORDER — INSULIN ASPART 100 UNIT/ML ~~LOC~~ SOLN
0.0000 [IU] | Freq: Three times a day (TID) | SUBCUTANEOUS | Status: DC
  Administered 2015-09-19: 18:00:00 3 [IU] via SUBCUTANEOUS
  Administered 2015-09-20: 2 [IU] via SUBCUTANEOUS

## 2015-09-19 MED ORDER — METOPROLOL SUCCINATE ER 50 MG PO TB24
50.0000 mg | ORAL_TABLET | Freq: Every day | ORAL | Status: DC
  Administered 2015-09-20: 50 mg via ORAL
  Filled 2015-09-19: qty 1

## 2015-09-19 MED ORDER — TICAGRELOR 90 MG PO TABS
ORAL_TABLET | ORAL | Status: DC | PRN
  Administered 2015-09-19: 180 mg via ORAL

## 2015-09-19 MED ORDER — HEPARIN (PORCINE) IN NACL 2-0.9 UNIT/ML-% IJ SOLN
INTRAMUSCULAR | Status: DC | PRN
  Administered 2015-09-19: 10:00:00 via INTRA_ARTERIAL

## 2015-09-19 MED ORDER — SIMVASTATIN 20 MG PO TABS
20.0000 mg | ORAL_TABLET | Freq: Every day | ORAL | Status: DC
  Filled 2015-09-19: qty 1

## 2015-09-19 MED ORDER — ACETAMINOPHEN 325 MG PO TABS
650.0000 mg | ORAL_TABLET | ORAL | Status: DC | PRN
  Administered 2015-09-19 (×2): 650 mg via ORAL
  Filled 2015-09-19 (×2): qty 2

## 2015-09-19 MED ORDER — LOSARTAN POTASSIUM 50 MG PO TABS: 100.0000 mg | ORAL_TABLET | Freq: Every day | ORAL | Status: DC

## 2015-09-19 MED ORDER — HYDROCHLOROTHIAZIDE 25 MG PO TABS
25.0000 mg | ORAL_TABLET | Freq: Every day | ORAL | Status: DC
  Administered 2015-09-20: 25 mg via ORAL
  Filled 2015-09-19: qty 1

## 2015-09-19 MED ORDER — HEPARIN SODIUM (PORCINE) 1000 UNIT/ML IJ SOLN
INTRAMUSCULAR | Status: DC | PRN
  Administered 2015-09-19: 2000 [IU] via INTRAVENOUS
  Administered 2015-09-19: 4000 [IU] via INTRAVENOUS
  Administered 2015-09-19: 5000 [IU] via INTRAVENOUS
  Administered 2015-09-19: 4000 [IU] via INTRAVENOUS

## 2015-09-19 MED ORDER — MIDAZOLAM HCL 2 MG/2ML IJ SOLN
INTRAMUSCULAR | Status: DC | PRN
  Administered 2015-09-19 (×2): 1 mg via INTRAVENOUS

## 2015-09-19 MED ORDER — SODIUM CHLORIDE 0.9 % IJ SOLN: 3.0000 mL | INTRAMUSCULAR | Status: DC | PRN

## 2015-09-19 MED ORDER — LOSARTAN POTASSIUM 50 MG PO TABS
100.0000 mg | ORAL_TABLET | Freq: Every day | ORAL | Status: DC
  Administered 2015-09-20: 11:00:00 100 mg via ORAL
  Filled 2015-09-19: qty 2

## 2015-09-19 MED ORDER — LOSARTAN POTASSIUM-HCTZ 100-25 MG PO TABS: 1.0000 | ORAL_TABLET | Freq: Every day | ORAL | Status: DC

## 2015-09-19 MED ORDER — SODIUM CHLORIDE 0.9 % IV SOLN: 250.0000 mL | INTRAVENOUS | Status: DC | PRN

## 2015-09-19 MED ORDER — ADULT MULTIVITAMIN W/MINERALS CH
1.0000 | ORAL_TABLET | Freq: Every day | ORAL | Status: DC
  Administered 2015-09-20: 1 via ORAL
  Filled 2015-09-19: qty 1

## 2015-09-19 MED ORDER — DULOXETINE HCL 60 MG PO CPEP
60.0000 mg | ORAL_CAPSULE | Freq: Every day | ORAL | Status: DC
  Administered 2015-09-19 – 2015-09-20 (×2): 60 mg via ORAL
  Filled 2015-09-19 (×3): qty 1

## 2015-09-19 MED ORDER — FLUTICASONE PROPIONATE 50 MCG/ACT NA SUSP
2.0000 | Freq: Every day | NASAL | Status: DC
  Filled 2015-09-19: qty 16

## 2015-09-19 MED ORDER — SODIUM CHLORIDE 0.9 % IJ SOLN: 3.0000 mL | Freq: Two times a day (BID) | INTRAMUSCULAR | Status: DC

## 2015-09-19 MED ORDER — HEPARIN SODIUM (PORCINE) 1000 UNIT/ML IJ SOLN
INTRAMUSCULAR | Status: AC
  Filled 2015-09-19: qty 1

## 2015-09-19 MED ORDER — MIDAZOLAM HCL 2 MG/2ML IJ SOLN
INTRAMUSCULAR | Status: AC
  Filled 2015-09-19: qty 2

## 2015-09-19 MED ORDER — AMLODIPINE BESYLATE 5 MG PO TABS
5.0000 mg | ORAL_TABLET | Freq: Every day | ORAL | Status: DC
  Administered 2015-09-20: 5 mg via ORAL
  Filled 2015-09-19 (×2): qty 1

## 2015-09-19 MED ORDER — ASPIRIN 81 MG PO CHEW
81.0000 mg | CHEWABLE_TABLET | Freq: Every day | ORAL | Status: DC
  Administered 2015-09-20: 81 mg via ORAL
  Filled 2015-09-19: qty 1

## 2015-09-19 MED ORDER — NITROGLYCERIN 0.4 MG SL SUBL: 0.4000 mg | SUBLINGUAL_TABLET | SUBLINGUAL | Status: DC | PRN

## 2015-09-19 MED ORDER — TICAGRELOR 90 MG PO TABS
ORAL_TABLET | ORAL | Status: AC
  Filled 2015-09-19: qty 2

## 2015-09-19 MED ORDER — FENTANYL CITRATE (PF) 100 MCG/2ML IJ SOLN
INTRAMUSCULAR | Status: AC
  Filled 2015-09-19: qty 2

## 2015-09-19 MED ORDER — FENTANYL CITRATE (PF) 100 MCG/2ML IJ SOLN
INTRAMUSCULAR | Status: DC | PRN
  Administered 2015-09-19 (×2): 25 ug via INTRAVENOUS

## 2015-09-19 MED ORDER — LIOTHYRONINE SODIUM 5 MCG PO TABS
5.0000 ug | ORAL_TABLET | Freq: Every day | ORAL | Status: DC
  Administered 2015-09-20: 5 ug via ORAL
  Filled 2015-09-19: qty 1

## 2015-09-19 MED ORDER — VITAMIN B-12 250 MCG PO TABS: 250.0000 ug | ORAL_TABLET | Freq: Every day | ORAL | Status: DC

## 2015-09-19 MED ORDER — SODIUM CHLORIDE 0.9 % WEIGHT BASED INFUSION
3.0000 mL/kg/h | INTRAVENOUS | Status: DC
  Administered 2015-09-19: 3 mL/kg/h via INTRAVENOUS

## 2015-09-19 MED ORDER — HYDROCHLOROTHIAZIDE 25 MG PO TABS: 25.0000 mg | ORAL_TABLET | Freq: Every day | ORAL | Status: DC

## 2015-09-19 MED ORDER — HEPARIN (PORCINE) IN NACL 2-0.9 UNIT/ML-% IJ SOLN
INTRAMUSCULAR | Status: AC
  Filled 2015-09-19: qty 1000

## 2015-09-19 MED ORDER — SODIUM CHLORIDE 0.9 % WEIGHT BASED INFUSION
3.0000 mL/kg/h | INTRAVENOUS | Status: DC
  Administered 2015-09-19 (×2): 3 mL/kg/h via INTRAVENOUS

## 2015-09-19 MED ORDER — PANTOPRAZOLE SODIUM 40 MG PO TBEC
40.0000 mg | DELAYED_RELEASE_TABLET | Freq: Every day | ORAL | Status: DC
  Administered 2015-09-19 – 2015-09-20 (×2): 40 mg via ORAL
  Filled 2015-09-19 (×2): qty 1

## 2015-09-19 MED ORDER — ASPIRIN 81 MG PO CHEW: 81.0000 mg | CHEWABLE_TABLET | ORAL | Status: DC

## 2015-09-19 MED ORDER — POTASSIUM CITRATE ER 10 MEQ (1080 MG) PO TBCR
20.0000 meq | EXTENDED_RELEASE_TABLET | Freq: Two times a day (BID) | ORAL | Status: DC
  Administered 2015-09-19 – 2015-09-20 (×2): 20 meq via ORAL
  Filled 2015-09-19 (×4): qty 2

## 2015-09-19 MED ORDER — VERAPAMIL HCL 2.5 MG/ML IV SOLN
INTRAVENOUS | Status: AC
  Filled 2015-09-19: qty 2

## 2015-09-19 MED ORDER — SODIUM CHLORIDE 0.9 % IJ SOLN
3.0000 mL | INTRAMUSCULAR | Status: DC | PRN
Start: 2015-09-19 — End: 2015-09-19

## 2015-09-19 MED ORDER — LEVOTHYROXINE SODIUM 75 MCG PO TABS
75.0000 ug | ORAL_TABLET | Freq: Every day | ORAL | Status: DC
  Administered 2015-09-20: 07:00:00 75 ug via ORAL
  Filled 2015-09-19: qty 1

## 2015-09-19 MED ORDER — METFORMIN HCL 500 MG PO TABS: 1000.0000 mg | ORAL_TABLET | Freq: Two times a day (BID) | ORAL | Status: DC

## 2015-09-19 SURGICAL SUPPLY — 28 items
BALLN EUPHORA RX 3.0X15 (BALLOONS) ×4
BALLN ~~LOC~~ EUPHORA RX 3.0X12 (BALLOONS) ×4
BALLN ~~LOC~~ EUPHORA RX 3.5X15 (BALLOONS) ×4
BALLN ~~LOC~~ EUPHORA RX 3.75X12 (BALLOONS) ×4
BALLN ~~LOC~~ EUPHORA RX 4.0X8 (BALLOONS) ×4
BALLOON EUPHORA RX 3.0X15 (BALLOONS) ×2 IMPLANT
BALLOON ~~LOC~~ EUPHORA RX 3.0X12 (BALLOONS) ×2 IMPLANT
BALLOON ~~LOC~~ EUPHORA RX 3.5X15 (BALLOONS) ×2 IMPLANT
BALLOON ~~LOC~~ EUPHORA RX 3.75X12 (BALLOONS) ×2 IMPLANT
BALLOON ~~LOC~~ EUPHORA RX 4.0X8 (BALLOONS) ×2 IMPLANT
CATH INFINITI 5 FR IM (CATHETERS) ×4 IMPLANT
CATH INFINITI 5 FR JL3.5 (CATHETERS) ×4 IMPLANT
CATH INFINITI 5FR ANG PIGTAIL (CATHETERS) ×4 IMPLANT
CATH INFINITI JR4 5F (CATHETERS) ×4 IMPLANT
DEVICE RAD COMP TR BAND LRG (VASCULAR PRODUCTS) ×4 IMPLANT
GLIDESHEATH SLEND SS 6F .021 (SHEATH) ×4 IMPLANT
GUIDE CATH RUNWAY 6FR AL 1 (CATHETERS) ×4 IMPLANT
GUIDELINER 6F (CATHETERS) ×4 IMPLANT
KIT ENCORE 26 ADVANTAGE (KITS) ×4 IMPLANT
KIT HEART LEFT (KITS) ×4 IMPLANT
PACK CARDIAC CATHETERIZATION (CUSTOM PROCEDURE TRAY) ×4 IMPLANT
STENT SYNERGY DES 4X20 (Permanent Stent) ×4 IMPLANT
SYR MEDRAD MARK V 150ML (SYRINGE) ×4 IMPLANT
TRANSDUCER W/STOPCOCK (MISCELLANEOUS) ×4 IMPLANT
TUBING CIL FLEX 10 FLL-RA (TUBING) ×4 IMPLANT
WIRE ASAHI PROWATER 180CM (WIRE) ×4 IMPLANT
WIRE PT2 MS 185 (WIRE) ×4 IMPLANT
WIRE SAFE-T 1.5MM-J .035X260CM (WIRE) ×4 IMPLANT

## 2015-09-19 NOTE — H&P (View-Only) (Signed)
Cardiology Office Note   Date:  09/12/2015   ID:  Paul [name], DOB 1946/06/19, MRN [id number]  PCP:  [name], MD  Cardiologist: Darlin Coco MD  No chief complaint on file.     History of Present Illness: Paul [name] is a 69 y.o. male who presents for a six-month follow-up visit  This pleasant 69 year old gentleman is seen for a scheduled followup office visit. He has a history of diabetes mellitus, coronary heart disease with prior coronary bypass graft surgery, essential hypertension, and hypercholesterolemia. His last stress test was on 06/13/12. He underwent a treadmill Myoview but had to be switched to Lexa scan because he was unable to reach target heart rate. 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 His overall ejection fraction was 60% with mild inferior wall hypokinesis. He is status post cholecystectomy. He also has a past history of prostate cancer in the past history of kidney stones. Dr. Alinda Money is his urologist. Since we last saw him he has been diagnosed with depression by his PCP Dr. Addison Lank. His symptoms of depression and responded to Lexapro. He has also been diagnosed with sleep apnea by Dr. Nehemiah Settle and now wears a CPAP machine. Dr. Buddy Duty is his endocrinologist and follows his thyroid function and his diabetes. From a cardiac standpoint the patient has been feeling fair.  He has been concerned that he seems to be having more shortness of breath and has had some precordial chest tightness with emotional stress or physical stress such as pulling in the garbage cans from the street.  He has taken sublingual nitroglycerin 2 several months ago.  Over the course of the summer he was not able to walk much because of a right thigh injury.  His thigh is now doing well after chiropractic treatment.  His blood pressure today is stable.  Past Medical History  Diagnosis Date  . Hypertension   .  Coronary artery disease   . Diabetes mellitus   . Hyperlipidemia   . GERD (gastroesophageal reflux disease)   . Allergy     Past Surgical History  Procedure Laterality Date  . Coronary artery bypass graft    . Cholecystectomy    . Prostate surgery    . Joint replacement      right knee     Current Outpatient Prescriptions  Medication Sig Dispense Refill  . amLODipine (NORVASC) 5 MG tablet TAKE ONE TABLET BY MOUTH ONCE DAILY 90 tablet 3  . aspirin 325 MG tablet Take 325 mg by mouth daily.      . clindamycin (CLEOCIN) 300 MG capsule Take 300 mg by mouth. Only prior to dental procedures    . Colestipol HCl (COLESTID PO) Take 1 g by mouth daily.     . DULoxetine (CYMBALTA) 60 MG capsule Take 60 mg by mouth daily.    Marland Kitchen EPINEPHrine (EPIPEN) 0.3 mg/0.3 mL DEVI Inject 0.3 mLs (0.3 mg total) into the muscle once. 1 Device PRN  . fluticasone (FLONASE) 50 MCG/ACT nasal spray Place 2 sprays into the nose daily. 16 g PRN  . GuaiFENesin (MUCINEX PO) Take 600 mg by mouth daily as needed (cough and congestion). As needed    . levothyroxine (SYNTHROID, LEVOTHROID) 75 MCG tablet Take 75 mcg by mouth daily.    Marland Kitchen liothyronine (CYTOMEL) 5 MCG tablet Take 5 mcg by mouth daily.    Marland Kitchen losartan-hydrochlorothiazide (HYZAAR) 100-25 MG per tablet Take 1 tablet by mouth daily.  90 tablet 0  . metFORMIN (GLUCOPHAGE) 1000 MG tablet Take 1,000 mg by mouth 2 (two) times daily.    . metoprolol succinate (TOPROL-XL) 50 MG 24 hr tablet TAKE ONE TABLET BY MOUTH ONCE DAILY TAKE WITH OR IMMEDIATELY FOLLOWING A MEAL 90 tablet 3  . MICRONIZED COLESTIPOL HCL 1 G tablet Take 1 tablet by mouth daily.    . Multiple Vitamin (MULTIVITAMIN) tablet Take 1 tablet by mouth daily.      . nitroGLYCERIN (NITROSTAT) 0.4 MG SL tablet Place 1 tablet (0.4 mg total) under the tongue every 5 (five) minutes as needed for chest pain. 25 tablet 12  . omeprazole (PRILOSEC) 40 MG capsule Take 40 mg by mouth daily.      Paul [name] test strip      . potassium citrate (UROCIT-K) 10 MEQ (1080 MG) SR tablet Take 2 tablets by mouth 2 (two) times daily.    . simvastatin (ZOCOR) 20 MG tablet Take 1 tablet (20 mg total) by mouth at bedtime. 90 tablet 3   No current facility-administered medications for this visit.    Allergies:   Alprazolam; Penicillins; Percocet; and Septra ds    Social History:  The patient  reports that he has never smoked. He does not have any smokeless tobacco history on file. He reports that he does not drink alcohol.   Family History:  The patient's family history includes Heart attack in his father; Heart disease in his mother; Vascular Disease in his sister.    ROS:  Please see the history of present illness.   Otherwise, review of systems are positive for none.   All other systems are reviewed and negative.  The patient complains of excessive fatigue.  He had to take a sabbatical from work because of the fatigue.   PHYSICAL EXAM: VS:  BP 118/80 mmHg  Pulse 82  Ht 5\' 8"  (1.727 m)  Wt 217 lb 12.8 oz (98.793 kg)  BMI 33.12 kg/m2 , BMI Body mass index is 33.12 kg/(m^2). GEN: Well nourished, well developed, in no acute distress HEENT: normal Neck: no JVD, carotid bruits, or masses Cardiac: RRR; no murmurs, rubs, or gallops,no edema  Respiratory:  clear to auscultation bilaterally, normal work of breathing GI: soft, nontender, nondistended, + BS MS: no deformity or atrophy Skin: warm and dry, no rash Neuro:  Strength and sensation are intact Psych: euthymic mood, full affect   EKG:  EKG is ordered today. The ekg ordered today demonstrates normal sinus rhythm.  Old inferior wall myocardial infarction.  Since prior tracing of 03/02/15, no significant change.   Recent Labs: 09/08/2015: ALT 22; BUN 21; Creat 1.13; Potassium 4.3; Sodium 137    Lipid Panel    Component Value Date/Time   CHOL 138 09/08/2015 0933   TRIG 275* 09/08/2015 0933   HDL 28* 09/08/2015 0933   CHOLHDL 4.9 09/08/2015 0933    VLDL 55* 09/08/2015 0933   LDLCALC 55 09/08/2015 0933   LDLDIRECT 76.0 02/28/2015 0939      Wt Readings from Last 3 Encounters:  09/12/15 217 lb 12.8 oz (98.793 kg)  03/02/15 212 lb (96.163 kg)  01/18/14 221 lb (100.245 kg)         ASSESSMENT AND PLAN:  1. Essential hypertension. 2. Ischemic heart disease status post CABG. 3. Hypothyroidism followed by Dr. Buddy Duty 4. Diabetes mellitus followed by Dr. Buddy Duty 5. History of depression followed by Dr. Leonides Schanz 6. History of sleep apnea followed by Dr. Maxwell Caul 7. History of prostate cancer  and kidney stones followed by Dr. Alinda Money 8. Dyspnea on exertion. 9. Chest pain with exertion.  Current medicines are reviewed at length with the patient today.  The patient does not have concerns regarding medicines.  The following changes have been made:  no change  Labs/ tests ordered today include:   Orders Placed This Encounter  Procedures  . Myocardial Perfusion Imaging  . EKG 12-Lead    Disposition: To evaluate his exertional dyspnea and chest tightness we will have him return for a treadmill Myoview stress test soon.  Continue current medication.  Recheck in 6 months for office visit with Dr. Martinique and 4 lipid panel hepatic function panel and basal metabolic panel  Signed, Darlin Coco MD 09/12/2015 2:55 PM    [address], [place], [place]  [address] Phone: [phone]; Fax: [phone]

## 2015-09-19 NOTE — Research (Signed)
TWILIGHT Research Study Informed Consent   Subject Name: [facility]  Subject met inclusion and exclusion criteria.  The informed consent form, study requirements and expectations were reviewed with the subject and questions and concerns were addressed prior to the signing of the consent form.  The subject verbalized understanding of the trial requirements.  The subject agreed to participate in the TWILIGHT trial and signed the informed consent on 09/19/2015 at 1625.  The informed consent was obtained prior to performance of any protocol-specific procedures for the subject.  A copy of the signed informed consent was given to the subject and a copy was placed in the subject's medical record.  Blossom Hoops 09/19/2015, 4:34 PM

## 2015-09-19 NOTE — Progress Notes (Signed)
TR BAND REMOVAL  LOCATION:  left radial  DEFLATED PER PROTOCOL:    Yes.    TIME BAND OFF / DRESSING APPLIED:    1630   SITE UPON ARRIVAL:    Level 0  SITE AFTER BAND REMOVAL:    Level 0  CIRCULATION SENSATION AND MOVEMENT:    Within Normal Limits   Yes.    COMMENTS:   Site rechecked frequently through remainder of shift with no change in assessment. Dressing dry and intact, radial and ulnar pulses palpable and no bleeding or hematoma noted.

## 2015-09-19 NOTE — Research (Signed)
Mr. Amor has consented to participate in the TWILIGHT Research Study.  He will be taking Brilinta 90 mg bid and ASA 81 mg for 3 months. At 3 months he will be randomized to either continue on the Brilinta and ASA or Brilinta only.  The Brilinta and the ASA are provided by the study and the patient will be given study medication at discharge.  Please do not write a prescription for the Brilinta as it is provided by the study.  Blossom Hoops, RN, Research Nurse, [phone]

## 2015-09-19 NOTE — Interval H&P Note (Signed)
History and Physical Interval Note:  09/19/2015 9:58 AM  Paul [name]  has presented today for surgery, with the diagnosis of abnormal nuclear stress test  The various methods of treatment have been discussed with the patient and family. After consideration of risks, benefits and other options for treatment, the patient has consented to  Procedure(s): Left Heart Cath and Coronary Angiography (N/A) as a surgical intervention .  The patient's history has been reviewed, patient examined, no change in status, stable for surgery.  I have reviewed the patient's chart and labs.  Questions were answered to the patient's satisfaction.    Cath Lab Visit (complete for each Cath Lab visit)  Clinical Evaluation Leading to the Procedure:   ACS: No.  Non-ACS:    Anginal Classification: CCS II  Anti-ischemic medical therapy: Maximal Therapy (2 or more classes of medications)  Non-Invasive Test Results: Intermediate-risk stress test findings: cardiac mortality 1-3%/year  Prior CABG: Previous CABG       Paul [name] [facility] 09/19/2015 9:58 AM

## 2015-09-20 DIAGNOSIS — E1169 Type 2 diabetes mellitus with other specified complication: Secondary | ICD-10-CM | POA: Diagnosis not present

## 2015-09-20 DIAGNOSIS — I25119 Atherosclerotic heart disease of native coronary artery with unspecified angina pectoris: Secondary | ICD-10-CM | POA: Diagnosis not present

## 2015-09-20 DIAGNOSIS — I119 Hypertensive heart disease without heart failure: Secondary | ICD-10-CM

## 2015-09-20 DIAGNOSIS — I209 Angina pectoris, unspecified: Secondary | ICD-10-CM | POA: Diagnosis not present

## 2015-09-20 DIAGNOSIS — E785 Hyperlipidemia, unspecified: Secondary | ICD-10-CM

## 2015-09-20 DIAGNOSIS — R931 Abnormal findings on diagnostic imaging of heart and coronary circulation: Secondary | ICD-10-CM | POA: Diagnosis not present

## 2015-09-20 DIAGNOSIS — I2582 Chronic total occlusion of coronary artery: Secondary | ICD-10-CM | POA: Diagnosis not present

## 2015-09-20 DIAGNOSIS — I25719 Atherosclerosis of autologous vein coronary artery bypass graft(s) with unspecified angina pectoris: Secondary | ICD-10-CM | POA: Diagnosis not present

## 2015-09-20 DIAGNOSIS — Z951 Presence of aortocoronary bypass graft: Secondary | ICD-10-CM

## 2015-09-20 LAB — GLUCOSE, CAPILLARY: GLUCOSE-CAPILLARY: 182 mg/dL — AB (ref 65–99)

## 2015-09-20 LAB — BASIC METABOLIC PANEL
Anion gap: 12 (ref 5–15)
BUN: 20 mg/dL (ref 6–20)
CHLORIDE: 101 mmol/L (ref 101–111)
CO2: 25 mmol/L (ref 22–32)
CREATININE: 1.35 mg/dL — AB (ref 0.61–1.24)
Calcium: 9.7 mg/dL (ref 8.9–10.3)
GFR calc Af Amer: >60 mL/min (ref >60)
GFR calc non Af Amer: 52 mL/min — ABNORMAL LOW (ref >60)
Glucose, Bld: 202 mg/dL — ABNORMAL HIGH (ref 65–99)
Potassium: 4.1 mmol/L (ref 3.5–5.1)
SODIUM: 138 mmol/L (ref 135–145)

## 2015-09-20 LAB — CBC
HCT: 35.9 % — ABNORMAL LOW (ref 39.0–52.0)
Hemoglobin: 12.7 g/dL — ABNORMAL LOW (ref 13.0–17.0)
MCH: 30.8 pg (ref 26.0–34.0)
MCHC: 35.4 g/dL (ref 30.0–36.0)
MCV: 86.9 fL (ref 78.0–100.0)
PLATELETS: 162 10*3/uL (ref 150–400)
RBC: 4.13 MIL/uL — ABNORMAL LOW (ref 4.22–5.81)
RDW: 13.7 % (ref 11.5–15.5)
WBC: 5.4 10*3/uL (ref 4.0–10.5)

## 2015-09-20 MED ORDER — ASPIRIN 81 MG PO TABS: 81.0000 mg | ORAL_TABLET | Freq: Every day | ORAL | Status: DC

## 2015-09-20 MED ORDER — TICAGRELOR 90 MG PO TABS: 90.0000 mg | ORAL_TABLET | Freq: Two times a day (BID) | ORAL | Status: DC

## 2015-09-20 MED FILL — Heparin Sodium (Porcine) 2 Unit/ML in Sodium Chloride 0.9%: INTRAMUSCULAR | Qty: 500 | Status: AC

## 2015-09-20 MED FILL — Lidocaine HCl Local Preservative Free (PF) Inj 1%: INTRAMUSCULAR | Qty: 30 | Status: AC

## 2015-09-20 NOTE — Discharge Summary (Signed)
Discharge Summary   Patient ID: Paul [name],  MRN: [id number], DOB/AGE: 69-Dec-1947 69 y.o.  Admit date: 09/19/2015 Discharge date: 09/20/2015  Primary Care Provider: [facility] Primary Cardiologist: Dr. Mare Ferrari (Will f/u with Dr. Martinique in future)  Discharge Diagnoses Principal Problem:   Angina pectoris [facility]) Active Problems:   Hx of CABG   Benign hypertensive heart disease without heart failure   Dyslipidemia associated with type 2 diabetes mellitus (HCC)   Abnormal nuclear stress test   DM   Elevated Creatinine   Allergies Allergies  Allergen Reactions  . Alprazolam     No good  . Penicillins Other (See Comments)    "Eyes swell shut"  . Percocet [Oxycodone-Acetaminophen]     nightmares  . Septra Ds [Sulfamethoxazole-Trimethoprim]     rash    Consultant: None  Procedures  Left Heart Cath and Cors/Grafts Angiography    Conclusion     Prox RCA to Dist RCA lesion, 99% stenosed.  Prox LAD to Mid LAD lesion, 70% stenosed.  Dist LAD lesion, 50% stenosed.  1st Diag lesion, 100% stenosed.  Ost Ramus lesion, 100% stenosed.  2nd Mrg lesion, 99% stenosed.  Mid Cx to Dist Cx lesion, 60% stenosed.  Origin lesion, 100% stenosed.  SVG to the first diagonal was injected is normal in caliber, and is anatomically normal.  Sequential SVG to the ramus intermediate and second OM was injected. there is a high grade stenosis in the distal SVG.  LIMA was injected is normal in caliber, and is anatomically normal.  The left ventricular systolic function is normal.  The SVG to PDA is known to be occluded from prior cath.  Mid Graft to Dist Graft lesion, between Ramus and 2nd Mrg, 85% stenosed. Post intervention, there is a 0% residual stenosis.  1. Severe 3 vessel obstructive CAD. 2. Patent LIMA to the LAD 3. Patent SVG to the diagonal 4. Sequential SVG to the ramus intermediate and second OM is patent with a severe complex stenosis in the distal  SVG 5. SVG to the PDA is known to be occluded. 6. Normal LV function 7. Successful stenting of the SVG to the second OM with a DES.   Plan: anticipate DC in am. DAPT for one year unless patient enrolled in the TWILIGHT trial.      History of Present Illness  This pleasant 69 year old with history of diabetes mellitus, coronary heart disease with prior coronary bypass graft surgery, essential hypertension, and hypercholesterolemia. His last stress test was on 06/13/12. He underwent a treadmill Myoview but had to be switched to Lexa scan because he was unable to reach target heart rate. 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 His overall ejection fraction was 60% with mild inferior wall hypokinesis. He is status post cholecystectomy. He also has a past history of prostate cancer in the past history of kidney stones. Dr. Alinda Money is his urologist. Since we last saw him he has been diagnosed with depression by his PCP Dr. Addison Lank. His symptoms of depression and responded to Lexapro. He has also been diagnosed with sleep apnea by Dr. Nehemiah Settle and now wears a CPAP machine. Dr. Buddy Duty is his endocrinologist and follows his thyroid function and his diabetes.   He has been concerned that he seems to be having more shortness of breath and has had some precordial chest tightness with emotional stress or physical stress such as pulling in the garbage cans from the street. He has  taken sublingual nitroglycerin 2 several months ago. Over the course of the summer he was not able to walk much because of a right thigh injury. His thigh is now doing well after chiropractic treatment. To evaluate his exertional dyspnea and chest tightness 11/14/12016 Dr. Mare Ferrari scehdule dtreadmill Myoview stress which was abnormal and intermediate risk.   Hospital Course  He was came to hospital 09/19/15 for schedule outpatient cath. Cath showed patent LIMA to the  LAD; Patent SVG to the diagonal; Sequential SVG to the ramus intermediate and second OM is patent with a severe complex stenosis in the distal SVG;SVG to RCA is totally occluded but there are left to right collaterals; SVG to the PDA is known to be occluded. S/p Successful stenting of the SVG to the second OM with a DES. Normal LV function. Ambulated well.  She has been seen by Dr. Tamala Julian today and deemed ready for discharge home. All follow-up appointments have been scheduled. Discharge medications are listed below.   Post cath creatinine of 1.35. Repeat BMET during outpatient visit. Advised to hold metformin for another 48 hours.    Discharge Vitals Blood pressure 122/76, pulse 73, temperature 98.5 F (36.9 C), temperature source Oral, resp. rate 18, height 5\' 8"  (1.727 m), weight 214 lb 4.6 oz (97.2 kg), SpO2 95 %.  Filed Weights   09/19/15 0847 09/20/15 0344  Weight: 215 lb (97.523 kg) 214 lb 4.6 oz (97.2 kg)    CBC  Recent Labs  09/20/15 0615  WBC 5.4  HGB 12.7*  HCT 35.9*  MCV 86.9  PLT 0000000   Basic Metabolic Panel  Recent Labs  09/20/15 0615  NA 138  K 4.1  CL 101  CO2 25  GLUCOSE 202*  BUN 20  CREATININE 1.35*  CALCIUM 9.7    Disposition  Pt is being discharged home today in good condition.  Follow-up Plans & Appointments  Follow-up Information    Follow up with SIMMONS, BRITTAINY, PA-C. Go on 10/13/2015.   Specialties:  Cardiology, Radiology   Why:  @9 :00 post hosptial    Contact information:   [address] [place] [address] [phone]           Discharge Instructions    AMB Referral to Cardiac Rehabilitation - Phase II    Complete by:  As directed   Diagnosis:  PCI     Amb Referral to Cardiac Rehabilitation    Complete by:  As directed   Diagnosis:  PCI     Call MD for:  redness, tenderness, or signs of infection (pain, swelling, redness, odor or green/yellow discharge around incision site)    Complete by:  As directed       Diet - low sodium heart healthy    Complete by:  As directed      Discharge instructions    Complete by:  As directed   No driving for 48 hours. No lifting over 5 lbs for 1 week. No sexual activity for 1 week. Marland Kitchen Keep procedure site clean & dry. If you notice increased pain, swelling, bleeding or pus, call/return!  You may shower, but no soaking baths/hot tubs/pools for 1 week.   Hold metformin until 09/22/15. Resume 09/23/15.     Increase activity slowly    Complete by:  As directed           F/u Labs/Studies: BMET during outpatient visit.   Discharge Medications    Medication List    TAKE these medications  amLODipine 5 MG tablet  Commonly known as:  NORVASC  TAKE ONE TABLET BY MOUTH ONCE DAILY     aspirin 81 MG tablet  Take 1 tablet (81 mg total) by mouth daily.     clindamycin 300 MG capsule  Commonly known as:  CLEOCIN  Take 300 mg by mouth See admin instructions. Only prior to dental procedures     colestipol 1 G tablet  Commonly known as:  COLESTID  Take 1 g by mouth daily.     DULoxetine 60 MG capsule  Commonly known as:  CYMBALTA  Take 60 mg by mouth daily.     EPINEPHrine 0.3 mg/0.3 mL Devi  Commonly known as:  EPIPEN  Inject 0.3 mLs (0.3 mg total) into the muscle once.     fluticasone 50 MCG/ACT nasal spray  Commonly known as:  FLONASE  Place 2 sprays into the nose daily.     levothyroxine 75 MCG tablet  Commonly known as:  SYNTHROID, LEVOTHROID  Take 75 mcg by mouth daily.     liothyronine 5 MCG tablet  Commonly known as:  CYTOMEL  Take 5 mcg by mouth daily.     losartan-hydrochlorothiazide 100-25 MG tablet  Commonly known as:  HYZAAR  Take 1 tablet by mouth daily.     metFORMIN 1000 MG tablet  Commonly known as:  GLUCOPHAGE  Take 1,000 mg by mouth 2 (two) times daily.     metoprolol succinate 50 MG 24 hr tablet  Commonly known as:  TOPROL-XL  TAKE ONE TABLET BY MOUTH ONCE DAILY TAKE WITH OR IMMEDIATELY FOLLOWING A MEAL      MUCINEX PO  Take 600 mg by mouth daily as needed (cough and congestion). As needed     multivitamin tablet  Take 1 tablet by mouth daily.     nitroGLYCERIN 0.4 MG SL tablet  Commonly known as:  NITROSTAT  Place 1 tablet (0.4 mg total) under the tongue every 5 (five) minutes as needed for chest pain.     omeprazole 40 MG capsule  Commonly known as:  PRILOSEC  Take 40 mg by mouth daily.     ONETOUCH VERIO test strip  Generic drug:  glucose blood     potassium citrate 10 MEQ (1080 MG) SR tablet  Commonly known as:  UROCIT-K  Take 2 tablets by mouth 2 (two) times daily.     simvastatin 20 MG tablet  Commonly known as:  ZOCOR  Take 1 tablet (20 mg total) by mouth at bedtime.     ticagrelor 90 MG Tabs tablet  Commonly known as:  BRILINTA  Take 1 tablet (90 mg total) by mouth 2 (two) times daily.     vitamin B-12 250 MCG tablet  Commonly known as:  CYANOCOBALAMIN  Take 250 mcg by mouth daily.        Duration of Discharge Encounter   Greater than 30 minutes including physician time.  Signed, Mylisa Brunson PA-C 09/20/2015, 10:38 AM

## 2015-09-20 NOTE — Progress Notes (Signed)
CM received consult : Verify brilinta. Pt has decided to participate in the twilight study. No needs identified @ present time per CM. Whitman Hero RN,BSN,CM [phone]

## 2015-09-20 NOTE — Progress Notes (Signed)
Patient Name: Paul [name] [facility] Date of Encounter: 09/20/2015   SUBJECTIVE  Feeling well. No chest pain, sob or palpitations. Many questions answered.   CURRENT MEDS . amLODipine  5 mg Oral Daily  . aspirin  81 mg Oral Daily  . colestipol  1 g Oral Daily  . DULoxetine  60 mg Oral Daily  . fluticasone  2 spray Each Nare Daily  . hydrochlorothiazide  25 mg Oral Daily   And  . losartan  100 mg Oral Daily  . insulin aspart  0-9 Units Subcutaneous TID WC  . levothyroxine  75 mcg Oral QAC breakfast  . liothyronine  5 mcg Oral Daily  . [START ON 09/21/2015] metFORMIN  1,000 mg Oral BID WC  . metoprolol succinate  50 mg Oral Daily  . multivitamin with minerals  1 tablet Oral Daily  . pantoprazole  40 mg Oral Daily  . potassium citrate  20 mEq Oral BID  . simvastatin  20 mg Oral QHS  . sodium chloride  3 mL Intravenous Q12H  . ticagrelor  90 mg Oral BID  . vitamin B-12  250 mcg Oral Daily    OBJECTIVE  Filed Vitals:   09/19/15 1600 09/19/15 2004 09/20/15 0344 09/20/15 0759  BP: 114/77 110/60 116/57 122/76  Pulse: 69 71 74 73  Temp: 97.7 F (36.5 C) 98.5 F (36.9 C) 98.8 F (37.1 C) 98.5 F (36.9 C)  TempSrc: Oral Oral Oral Oral  Resp: 19 24 20 18   Height:      Weight:   214 lb 4.6 oz (97.2 kg)   SpO2: 96% 93% 95% 95%    Intake/Output Summary (Last 24 hours) at 09/20/15 0914 Last data filed at 09/19/15 2237  Gross per 24 hour  Intake   1770 ml  Output   1300 ml  Net    470 ml   Filed Weights   09/19/15 0847 09/20/15 0344  Weight: 215 lb (97.523 kg) 214 lb 4.6 oz (97.2 kg)    PHYSICAL EXAM  General: Pleasant, NAD. Neuro: Alert and oriented X 3. Moves all extremities spontaneously. Psych: Normal affect. HEENT:  Normal  Neck: Supple without bruits or JVD. Lungs:  Resp regular and unlabored, CTA. Heart: RRR no s3, s4, or murmurs. Abdomen: Soft, non-tender, non-distended, BS + x 4.  Extremities: No clubbing, cyanosis or edema. DP/PT/Radials 2+ and equal  bilaterally. L wrist  Cath site without hematoma.   Accessory Clinical Findings  CBC  Recent Labs  09/20/15 0615  WBC 5.4  HGB 12.7*  HCT 35.9*  MCV 86.9  PLT 0000000   Basic Metabolic Panel  Recent Labs  09/20/15 0615  NA 138  K 4.1  CL 101  CO2 25  GLUCOSE 202*  BUN 20  CREATININE 1.35*  CALCIUM 9.7    TELE  Sinus rhythm   Left Heart Cath and Cors/Grafts Angiography    Conclusion     Prox RCA to Dist RCA lesion, 99% stenosed.  Prox LAD to Mid LAD lesion, 70% stenosed.  Dist LAD lesion, 50% stenosed.  1st Diag lesion, 100% stenosed.  Ost Ramus lesion, 100% stenosed.  2nd Mrg lesion, 99% stenosed.  Mid Cx to Dist Cx lesion, 60% stenosed.  Origin lesion, 100% stenosed.  SVG to the first diagonal was injected is normal in caliber, and is anatomically normal.  Sequential SVG to the ramus intermediate and second OM was injected. there is a high grade stenosis in the distal SVG.  LIMA was injected is  normal in caliber, and is anatomically normal.  The left ventricular systolic function is normal.  The SVG to PDA is known to be occluded from prior cath.  Mid Graft to Dist Graft lesion, between Ramus and 2nd Mrg, 85% stenosed. Post intervention, there is a 0% residual stenosis.  1. Severe 3 vessel obstructive CAD. 2. Patent LIMA to the LAD 3. Patent SVG to the diagonal 4. Sequential SVG to the ramus intermediate and second OM is patent with a severe complex stenosis in the distal SVG 5. SVG to the PDA is known to be occluded. 6. Normal LV function 7. Successful stenting of the SVG to the second OM with a DES.   Plan: anticipate DC in am. DAPT for one year unless patient enrolled in the TWILIGHT trial.      Radiology/Studies  Dg Chest 2 View  09/15/2015  CLINICAL DATA:  Pre-op for cardiac catheterization 09/19/2015. History of CABG and myocardial infarction. Initial encounter. EXAM: CHEST  2 VIEW COMPARISON:  01/15/2012 and 09/12/2006  radiographs. FINDINGS: The heart size and mediastinal contours are stable status post CABG. There is mild aortic tortuosity. The lungs are clear. There is no pleural effusion or pneumothorax. AC joint degenerative changes are present bilaterally. There are mild degenerative changes within the spine associated with a mild scoliosis. IMPRESSION: Stable chest.  No active cardiopulmonary process. Electronically Signed   By: Richardean Sale M.D.   On: 09/15/2015 16:34    ASSESSMENT AND PLAN  1. Angina pectoris ([place]) - Recent abnormal nuclear stress test.  - Cath showed . Patent LIMA to the LAD; Patent SVG to the diagonal; Sequential SVG to the ramus intermediate and second OM is patent with a severe complex stenosis in the distal SVG;  SVG to the PDA is known to be occluded. S/p Successful stenting of the SVG to the second OM with a DES. Normal LV function.  -  DAPT for one year unless patient enrolled in the TWILIGHT trial. Continue BB, ACE and statin.   2. Elevated creatinine - Post cath Creatinine elevated to 1.35.  BMET in one week as outpatient. Hold metformin 48 hours post cath.     Active Problems:   Hx of CABG   Benign hypertensive heart disease without heart failure   Dyslipidemia associated with type 2 diabetes mellitus (HCC)   Abnormal nuclear stress test     Signed, Josely Moffat PA-C Pager [phone]

## 2015-09-20 NOTE — Progress Notes (Signed)
CARDIAC REHAB PHASE I   PRE:  Rate/Rhythm: 84 SR    BP: sitting 104/71    SaO2:   MODE:  Ambulation: 450 ft   POST:  Rate/Rhythm: 96 SR    BP: sitting 148/83     SaO2:   Tolerated well, no c/o. Ed completed. Would like to do CRPII and will send referral to [facility]. Pt anxious to get back to exercising. Ducor, [place], ACSM 09/20/2015 10:17 AM

## 2015-09-21 ENCOUNTER — Other Ambulatory Visit: Payer: Self-pay | Admitting: *Deleted

## 2015-09-21 MED ORDER — AMBULATORY NON FORMULARY MEDICATION: 90.0000 mg | Freq: Two times a day (BID) | Status: DC

## 2015-09-21 MED ORDER — AMBULATORY NON FORMULARY MEDICATION: 81.0000 mg | Freq: Every day | Status: DC

## 2015-09-26 ENCOUNTER — Telehealth: Payer: Self-pay | Admitting: Cardiology

## 2015-09-26 DIAGNOSIS — R079 Chest pain, unspecified: Secondary | ICD-10-CM

## 2015-09-26 MED ORDER — NITROGLYCERIN 0.4 MG SL SUBL: 0.4000 mg | SUBLINGUAL_TABLET | SUBLINGUAL | Status: DC | PRN

## 2015-09-26 NOTE — Progress Notes (Signed)
Cardiology Office Note   Date:  09/27/2015   ID:  Paul [name], DOB 01/11/1946, MRN [id number]   Patient Care Team: [name], MD as PCP - General (Family Medicine) Darlin Coco, MD as Consulting Physician (Cardiology)    Chief Complaint  Patient presents with  . Hospitalization Follow-up    s/p PCI  . Coronary Artery Disease     History of Present Illness: Paul [name] is a 69 y.o. male with a hx of CAD s/p CABG 12/2003, DM2, HTN, HL, prostate CA, nephrolithiasis, depression, OSA on CPAP, hypothyroidism.  He saw Dr. Darlin Coco 11/14 for exertional chest discomfort and dyspnea.  Nuclear stress test was intermediate risk with inferior and inferolateral scar with peri-infarct ischemia. He was set up for cardiac cath. LHC by Dr. Peter Martinique on 09/19/15 demonstrated 3 v CAD, patent L-LAD, patent S-Dx, old occluded S-PDA and patent S-RI/OM2 with severe distal graft disease.  He underwent PCI with DES to the S-RI/OM2.  Post PCI course was uneventful.  Plan is to FU with Dr. Peter Martinique in the future in light of Dr. Sherryl Barters retirement.    Returns for FU.  He is here today with his wife.  He called in recently with some dyspnea and chest pain and returns for earlier than planned FU. He notes DOE since DC from the hospital.  He cannot really tell what type of exertion will definitely cause his symptoms.  He does note L sided chest tinging sensation that seems to come on with certain types of activity that cause SOB.  He feels some of it up into his throat.  He denies associated nausea or diaphoresis.  He denies orthopnea.  He sleeps with CPAP.  He denies syncope. He denies pleuritic chest pain or chest pain with lying supine.  All of these symptoms are similar to what he had prior to his PCI.  They never really seemed to improve. He does feel like his dyspnea has, overall, improved since last week.      Studies/Reports Reviewed Today:  LHC 09/19/15 LAD:  prox 70%,  dist 50%, D1 100% RI: ostial 100% LCx: mid 60%, OM2 99% RCA: prox 99% with L>R collaterals S-RPDA:  100% S-D1: normal S-RI/OM2: mid 85% >> PCI:  4 x 20 mm Synergy DES L-LAD: normal EF:  55-65% 1. Severe 3 vessel obstructive CAD. 2. Patent LIMA to the LAD 3. Patent SVG to the diagonal 4. Sequential SVG to the ramus intermediate and second OM is patent with a severe complex stenosis in the distal SVG 5. SVG to the PDA is known to be occluded. 6. Normal LV function 7. Successful stenting of the SVG to the second OM with a DES.  Plan: anticipate DC in am. DAPT for one year unless patient enrolled in the TWILIGHT trial        Myoview 09/13/15 Myocardial perfusion is abnormal. Findings consistent with prior myocardial infarction with peri-infarct ischemia. This is an intermediate risk study. Overall left ventricular systolic function was normal. LV cavity size is normal. Nuclear stress EF: 56%. The left ventricular ejection fraction is normal (55-65%). A prior study was conducted on 06/13/2012.Compared to the prior study, there are changes.   Past Medical History  Diagnosis Date  . Hypertension   . Coronary artery disease     a. s/p CABG;  b. LHC 11/16: pLAD 70, dLAD 50, D1 100, oRI 100, mLCx 60, OM2 99, pRCA 99 (L>R collats), S-RPDA 100, S-D1 ok, S-RI/OM2 mid 85 (  PCI: Synergy DES), L-LAD ok, EF 55-65%  . Diabetes mellitus   . Hyperlipidemia   . GERD (gastroesophageal reflux disease)   . Allergy   . History of nuclear stress test     a. Myoview 11/16:  inferior and inferolateral infarct with peri-infarct ischemia, EF 56%; Intermediate Risk >> LHC >> PCI to S-RI/OM2   . Prostate cancer ([place])   . OSA (obstructive sleep apnea)     CPAP  . Hypothyroidism   . Depression     Past Surgical History  Procedure Laterality Date  . Coronary artery bypass graft    . Cholecystectomy    . Prostate surgery    . Joint replacement      right knee  . Cardiac catheterization N/A 09/19/2015     Procedure: Coronary Stent Intervention;  Surgeon: Peter M Martinique, MD;  Location: [facility];  Service: Cardiovascular;  Laterality: N/A;  . Cardiac catheterization  09/19/2015    Procedure: Left Heart Cath and Cors/Grafts Angiography;  Surgeon: Peter M Martinique, MD;  Location: [facility];  Service: Cardiovascular;;     Current Outpatient Prescriptions  Medication Sig Dispense Refill  . AMBULATORY NON FORMULARY MEDICATION Take 90 mg by mouth 2 (two) times daily. Medication Name: BRILINTA 90 mg BID (TWILIGHT Research Study Provided)    . AMBULATORY NON FORMULARY MEDICATION Take 81 mg by mouth daily. Medication Name: ASA 81 mg Daily (TWILIGHT Research Study Provided)    . amLODipine (NORVASC) 5 MG tablet TAKE ONE TABLET BY MOUTH ONCE DAILY 90 tablet 3  . BRILINTA 90 MG TABS tablet Take 90 mg by mouth 2 (two) times daily.     . clindamycin (CLEOCIN) 300 MG capsule Take 300 mg by mouth See admin instructions. Only prior to dental procedures    . colestipol (COLESTID) 1 G tablet Take 1 g by mouth daily.    . DULoxetine (CYMBALTA) 60 MG capsule Take 60 mg by mouth daily.    Marland Kitchen EPINEPHrine (EPIPEN) 0.3 mg/0.3 mL DEVI Inject 0.3 mLs (0.3 mg total) into the muscle once. 1 Device PRN  . fluticasone (FLONASE) 50 MCG/ACT nasal spray Place 2 sprays into the nose daily. 16 g PRN  . GuaiFENesin (MUCINEX PO) Take 600 mg by mouth daily as needed (cough and congestion). As needed    . levothyroxine (SYNTHROID, LEVOTHROID) 75 MCG tablet Take 75 mcg by mouth daily.    Marland Kitchen liothyronine (CYTOMEL) 5 MCG tablet Take 5 mcg by mouth daily.    Marland Kitchen losartan-hydrochlorothiazide (HYZAAR) 100-25 MG per tablet Take 1 tablet by mouth daily. 90 tablet 0  . metFORMIN (GLUCOPHAGE) 1000 MG tablet Take 1,000 mg by mouth 2 (two) times daily.    . metoprolol succinate (TOPROL-XL) 50 MG 24 hr tablet Take 1 tablet (50 mg total) by mouth 2 (two) times daily. 180 tablet 3  . Multiple Vitamin (MULTIVITAMIN) tablet Take 1 tablet  by mouth daily.      . nitroGLYCERIN (NITROSTAT) 0.4 MG SL tablet Place 1 tablet (0.4 mg total) under the tongue every 5 (five) minutes as needed for chest pain. 25 tablet 12  . omeprazole (PRILOSEC) 40 MG capsule Take 40 mg by mouth daily.      Glory Rosebush DELICA LANCETS [id number] MISC     . ONETOUCH VERIO test strip 1 each by Other route as needed for other (CHECK BLOOD SUGER).     . potassium citrate (UROCIT-K) 10 MEQ (1080 MG) SR tablet Take 2 tablets by mouth 2 (  two) times daily.    . simvastatin (ZOCOR) 20 MG tablet Take 1 tablet (20 mg total) by mouth at bedtime. 90 tablet 3  . vitamin B-12 (CYANOCOBALAMIN) 250 MCG tablet Take 250 mcg by mouth daily.     No current facility-administered medications for this visit.    Allergies:   Alprazolam; Penicillins; Percocet; and Septra ds    Social History:   Social History   Social History  . Marital Status: Married    Spouse Name: N/A  . Number of Children: N/A  . Years of Education: N/A   Social History Main Topics  . Smoking status: Never Smoker   . Smokeless tobacco: None  . Alcohol Use: No  . Drug Use: None  . Sexual Activity: Not Asked   Other Topics Concern  . None   Social History Narrative     Family History:   Family History  Problem Relation Age of Onset  . Heart disease Mother   . Heart attack Father   . Vascular Disease Sister       ROS:   Please see the history of present illness.   Review of Systems  Constitution: Positive for malaise/fatigue.  HENT: Positive for congestion.        Toothache  Cardiovascular: Positive for chest pain and dyspnea on exertion.  Respiratory: Positive for cough, shortness of breath and snoring.   Musculoskeletal: Positive for myalgias.  Gastrointestinal: Positive for abdominal pain and diarrhea.  Neurological: Positive for dizziness.  Psychiatric/Behavioral: Positive for depression. The patient is nervous/anxious.   All other systems reviewed and are negative.      PHYSICAL EXAM: VS:  BP 130/70 mmHg  Pulse 92  Ht 5\' 8"  (1.727 m)  Wt 215 lb 3.2 oz (97.614 kg)  BMI 32.73 kg/m2  SpO2 97%    Wt Readings from Last 3 Encounters:  09/27/15 215 lb 3.2 oz (97.614 kg)  09/20/15 214 lb 4.6 oz (97.2 kg)  09/13/15 217 lb (98.431 kg)     GEN: Well nourished, well developed, in no acute distress HEENT: normal Neck: no JVD,  no masses Cardiac:  Normal S1/S2, RRR; no murmur ,  no rubs or gallops, no edema.  L wrist without hematoma or mass  Respiratory:  clear to auscultation bilaterally, no wheezing, rhonchi or rales. GI: soft, nontender, nondistended, + BS MS: no deformity or atrophy Skin: warm and dry  Neuro:  CNs II-XII intact, Strength and sensation are intact Psych: Normal affect   EKG:  EKG is ordered today.  It demonstrates:   NSR, HR 92, LAD, NSSTTW changes, QTc 440 ms   Recent Labs: 09/08/2015: ALT 22 09/20/2015: BUN 20; Creatinine, Ser 1.35*; Hemoglobin 12.7*; Platelets 162; Potassium 4.1; Sodium 138    Lipid Panel    Component Value Date/Time   CHOL 138 09/08/2015 0933   TRIG 275* 09/08/2015 0933   HDL 28* 09/08/2015 0933   CHOLHDL 4.9 09/08/2015 0933   VLDL 55* 09/08/2015 0933   LDLCALC 55 09/08/2015 0933   LDLDIRECT 76.0 02/28/2015 0939      ASSESSMENT AND PLAN:  1. CAD:  Hx of CABG.  S/p recent LHC for [place] demonstrating high grade graft disease in the S-RI/OM2 treated with Synergy DES.  S-PDA and native RCA known to be occluded.  He has L >> R collaterals.  He needs continued dual antiplatelet Rx for minimum of 1 year.  Since his PCI, he continues to have chest pain that is atypical and DOE.  Overall, he  feels his dyspnea is improved.  I question if he is having SEs to Brilinta vs chronic angina related to his occluded RCA and S-PDA.  His HR is in the 90s and his BP could tolerate more beta-blocker.  We discussed the possibility of changing Brilinta to Plavix or Effient. He prefers to remain on Brilinta for now.  -   Continue Brilinta for now.  Consider changing to Plavix or Effient in FU if symptoms continue  -  Increase Toprol-XL to 50 mg bid for anti-anginal control  -  Start Cardiac Rehab  -  Close FU in 1 week.  2. HTN:  Controlled.   3. Hyperlipidemia:  Continue statin.  LDL in 11/16 was 55.  4. OSA:  Continue CPAP.  5. Diabetes Mellitus:  FU with PCP.  6. Dental Pain:  He needs a root canal.  I discussed with Dr. Daneen Schick. He recommends waiting 4 weeks post PCI before proceeding with any dental procedure to allow time for some endothelialization of the stent before exposing the patient to a bolus of bacteremia.  He should NOT stop Brilinta or ASA for his procedure.       Medication Changes: Current medicines are reviewed at length with the patient today.  Concerns regarding medicines are as outlined above.  The following changes have been made:   Discontinued Medications   No medications on file   Modified Medications   Modified Medication Previous Medication   METOPROLOL SUCCINATE (TOPROL-XL) 50 MG 24 HR TABLET metoprolol succinate (TOPROL-XL) 50 MG 24 hr tablet      Take 1 tablet (50 mg total) by mouth 2 (two) times daily.    TAKE ONE TABLET BY MOUTH ONCE DAILY TAKE WITH OR IMMEDIATELY FOLLOWING A MEAL   New Prescriptions   No medications on file   Labs/ tests ordered today include:   Orders Placed This Encounter  Procedures  . EKG 12-Lead     Disposition:    FU with me in 1 week.     Signed, Versie Starks, MHS 09/27/2015 5:24 PM    [facility] [place], [place],   [address] Phone: [phone]; Fax: [phone]

## 2015-09-26 NOTE — Telephone Encounter (Signed)
Spoke with patient and he had a lot of shortness of breath over the weekend with little exertion, stent Monday 1 week ago Does seem to be doing some better but did schedule appointment for patient to see Brynda Rim  PA tomorrow

## 2015-09-26 NOTE — Telephone Encounter (Signed)
New message      Talk to Paul [name] about his follow up from angioplasty

## 2015-09-27 ENCOUNTER — Telehealth: Payer: Self-pay | Admitting: Cardiology

## 2015-09-27 ENCOUNTER — Ambulatory Visit (INDEPENDENT_AMBULATORY_CARE_PROVIDER_SITE_OTHER): Payer: Commercial Managed Care - HMO | Admitting: Physician Assistant

## 2015-09-27 VITALS — BP 130/70 | HR 92 | Ht 68.0 in | Wt 215.2 lb

## 2015-09-27 DIAGNOSIS — I25810 Atherosclerosis of coronary artery bypass graft(s) without angina pectoris: Secondary | ICD-10-CM | POA: Diagnosis not present

## 2015-09-27 DIAGNOSIS — R0789 Other chest pain: Secondary | ICD-10-CM

## 2015-09-27 DIAGNOSIS — R0602 Shortness of breath: Secondary | ICD-10-CM

## 2015-09-27 DIAGNOSIS — I119 Hypertensive heart disease without heart failure: Secondary | ICD-10-CM

## 2015-09-27 DIAGNOSIS — G4730 Sleep apnea, unspecified: Secondary | ICD-10-CM | POA: Diagnosis not present

## 2015-09-27 DIAGNOSIS — E7800 Pure hypercholesterolemia, unspecified: Secondary | ICD-10-CM | POA: Diagnosis not present

## 2015-09-27 DIAGNOSIS — K0889 Other specified disorders of teeth and supporting structures: Secondary | ICD-10-CM

## 2015-09-27 MED ORDER — METOPROLOL SUCCINATE ER 50 MG PO TB24: 50.0000 mg | ORAL_TABLET | Freq: Two times a day (BID) | ORAL | Status: DC

## 2015-09-27 NOTE — Telephone Encounter (Signed)
New Message  Pt requested to speak w/ RN concerning medication issue post stint. Please call back and discuss.

## 2015-09-27 NOTE — Telephone Encounter (Signed)
Pt called because he is having a bad tooth ache, pt states  That he may need a root  canal today. Pt is post stent placement on 09/20/15. Pt also states he may need to cancelled today's appointment with Scott weaver at 2:30 PM . Pt is on his way to see the dentist to have an  xray of his tooth , no medication is needed for now. Pt will call back after seen the dentist to see if he is able to come for the appointment.

## 2015-09-27 NOTE — Telephone Encounter (Signed)
Spoke with pt  SEE NOTE BELLOW.

## 2015-09-27 NOTE — Patient Instructions (Addendum)
Medication Instructions:  Your physician has recommended you make the following change in your medication:  1.  INCREASE the Toprol XL 50 mg taking 1 tablet twice a day   Labwork: TODAY:  Testing/Procedures:   Follow-Up: Your physician wants you to follow-up in [place], [place] ON A DAY DR. Tamala Julian IS IN THE OFFICE  Any Other Special Instructions Will Be Listed Below (If Applicable).   If you need a refill on your cardiac medications before your next appointment, please call your pharmacy.

## 2015-09-27 NOTE — Telephone Encounter (Signed)
Follow Up ° °Pt returned call//  °

## 2015-09-28 NOTE — Telephone Encounter (Signed)
Pt was seen by Nicki Reaper weaver PA yesterday 09/27/15.

## 2015-10-03 NOTE — Progress Notes (Signed)
Cardiology Office Note   Date:  10/04/2015   ID:  [name], DOB [birth year], MRN [id number]   Patient Care Team: [name], MD as PCP - General (Family Medicine) Darlin Coco, MD as Consulting Physician (Cardiology)    Chief Complaint  Patient presents with  . Follow-up  . Chest Pain  . Coronary Artery Disease     History of Present Illness: Paul [name] is a 69 y.o. male with a hx of CAD s/p CABG 12/2003, DM2, HTN, HL, prostate CA, nephrolithiasis, depression, OSA on CPAP, hypothyroidism.  He saw Dr. Darlin Coco 11/14 for exertional chest discomfort and dyspnea.  Nuclear stress test was intermediate risk with inferior and inferolateral scar with peri-infarct ischemia. He was set up for cardiac cath. LHC by Dr. Peter Martinique on 09/19/15 demonstrated 3 v CAD, patent L-LAD, patent S-Dx, old occluded S-PDA and patent S-RI/OM2 with severe distal graft disease.  He underwent PCI with DES to the S-RI/OM2.  Post PCI course was uneventful.  Plan is to FU with Dr. Peter Martinique in the future in light of Dr. Sherryl Barters retirement.    I saw him last week for FU.  He had recent episodes of chest pain and SOB.  Overall, his dyspnea was mildly improved.  Given natic RCA occlusion and S-PDA occlusion, I increased his beta-blocker for anti-anginal control.  He did need a root canal.  I reviewed with Dr. Daneen Schick and we recommended holding off on dental procedures for 4 weeks post PCI.  Returns for FU.  He is doing much better.  He denies any further chest symptoms or dyspnea.   Sleeps with CPAP.  No edema.  No syncope.  He is now on antibiotics and his tooth feels much better.  He never increased the Toprol to bid.     Studies/Reports Reviewed Today:  LHC 09/19/15 LAD:  prox 70%, dist 50%, D1 100% RI: ostial 100% LCx: mid 60%, OM2 99% RCA: prox 99% with L>R collaterals S-RPDA:  100% S-D1: normal S-RI/OM2: mid 85% >> PCI:  4 x 20 mm Synergy DES L-LAD: normal EF:   55-65% 1. Severe 3 vessel obstructive CAD. 2. Patent LIMA to the LAD 3. Patent SVG to the diagonal 4. Sequential SVG to the ramus intermediate and second OM is patent with a severe complex stenosis in the distal SVG 5. SVG to the PDA is known to be occluded. 6. Normal LV function 7. Successful stenting of the SVG to the second OM with a DES.  Plan: anticipate DC in am. DAPT for one year unless patient enrolled in the TWILIGHT trial        Myoview 09/13/15 Myocardial perfusion is abnormal. Findings consistent with prior myocardial infarction with peri-infarct ischemia. This is an intermediate risk study. Overall left ventricular systolic function was normal. LV cavity size is normal. Nuclear stress EF: 56%. The left ventricular ejection fraction is normal (55-65%). A prior study was conducted on 06/13/2012.Compared to the prior study, there are changes.   Past Medical History  Diagnosis Date  . Hypertension   . Coronary artery disease     a. s/p CABG;  b. LHC 11/16: pLAD 70, dLAD 50, D1 100, oRI 100, mLCx 60, OM2 99, pRCA 99 (L>R collats), S-RPDA 100, S-D1 ok, S-RI/OM2 mid 85 (PCI: Synergy DES), L-LAD ok, EF 55-65%  . Diabetes mellitus   . Hyperlipidemia   . GERD (gastroesophageal reflux disease)   . Allergy   . History of nuclear stress test  a. Myoview 11/16:  inferior and inferolateral infarct with peri-infarct ischemia, EF 56%; Intermediate Risk >> LHC >> PCI to S-RI/OM2   . Prostate cancer ([place])   . OSA (obstructive sleep apnea)     CPAP  . Hypothyroidism   . Depression     Past Surgical History  Procedure Laterality Date  . Coronary artery bypass graft    . Cholecystectomy    . Prostate surgery    . Joint replacement      right knee  . Cardiac catheterization N/A 09/19/2015    Procedure: Coronary Stent Intervention;  Surgeon: Peter M Martinique, MD;  Location: [facility];  Service: Cardiovascular;  Laterality: N/A;  . Cardiac catheterization  09/19/2015     Procedure: Left Heart Cath and Cors/Grafts Angiography;  Surgeon: Peter M Martinique, MD;  Location: [facility];  Service: Cardiovascular;;     Current Outpatient Prescriptions  Medication Sig Dispense Refill  . AMBULATORY NON FORMULARY MEDICATION Take 90 mg by mouth 2 (two) times daily. Medication Name: BRILINTA 90 mg BID (TWILIGHT Research Study Provided)    . AMBULATORY NON FORMULARY MEDICATION Take 81 mg by mouth daily. Medication Name: ASA 81 mg Daily (TWILIGHT Research Study Provided)    . amLODipine (NORVASC) 5 MG tablet TAKE ONE TABLET BY MOUTH ONCE DAILY 90 tablet 3  . BRILINTA 90 MG TABS tablet Take 90 mg by mouth 2 (two) times daily.     . clindamycin (CLEOCIN) 150 MG capsule     . clindamycin (CLEOCIN) 300 MG capsule Take 300 mg by mouth See admin instructions. Only prior to dental procedures    . colestipol (COLESTID) 1 G tablet Take 1 g by mouth daily.    . DULoxetine (CYMBALTA) 60 MG capsule Take 60 mg by mouth daily.    Marland Kitchen EPINEPHrine (EPIPEN) 0.3 mg/0.3 mL DEVI Inject 0.3 mLs (0.3 mg total) into the muscle once. 1 Device PRN  . fluticasone (FLONASE) 50 MCG/ACT nasal spray Place 2 sprays into the nose daily. 16 g PRN  . GuaiFENesin (MUCINEX PO) Take 600 mg by mouth daily as needed (cough and congestion). As needed    . HYDROcodone-acetaminophen (NORCO/VICODIN) 5-325 MG tablet Take 1-2 tablets by mouth every 6 (six) hours as needed for moderate pain.     Marland Kitchen levothyroxine (SYNTHROID, LEVOTHROID) 75 MCG tablet Take 75 mcg by mouth daily.    Marland Kitchen liothyronine (CYTOMEL) 5 MCG tablet Take 5 mcg by mouth daily.    Marland Kitchen losartan-hydrochlorothiazide (HYZAAR) 100-25 MG per tablet Take 1 tablet by mouth daily. 90 tablet 0  . metFORMIN (GLUCOPHAGE) 1000 MG tablet Take 1,000 mg by mouth 2 (two) times daily.    . metoprolol succinate (TOPROL-XL) 50 MG 24 hr tablet Take 1 tablet (50 mg total) by mouth daily.    . Multiple Vitamin (MULTIVITAMIN) tablet Take 1 tablet by mouth daily.      .  nitroGLYCERIN (NITROSTAT) 0.4 MG SL tablet Place 1 tablet (0.4 mg total) under the tongue every 5 (five) minutes as needed for chest pain. 25 tablet 12  . omeprazole (PRILOSEC) 40 MG capsule Take 40 mg by mouth daily.      Glory Rosebush DELICA LANCETS [id number] MISC     . ONETOUCH VERIO test strip 1 each by Other route as needed for other (CHECK BLOOD SUGER).     . potassium citrate (UROCIT-K) 10 MEQ (1080 MG) SR tablet Take 2 tablets by mouth 2 (two) times daily.    . simvastatin (  ZOCOR) 20 MG tablet Take 1 tablet (20 mg total) by mouth at bedtime. 90 tablet 3  . vitamin B-12 (CYANOCOBALAMIN) 250 MCG tablet Take 250 mcg by mouth daily.     No current facility-administered medications for this visit.    Allergies:   Alprazolam; Penicillins; Percocet; and Septra ds    Social History:   Social History   Social History  . Marital Status: Married    Spouse Name: N/A  . Number of Children: N/A  . Years of Education: N/A   Social History Main Topics  . Smoking status: Never Smoker   . Smokeless tobacco: None  . Alcohol Use: No  . Drug Use: None  . Sexual Activity: Not Asked   Other Topics Concern  . None   Social History Narrative     Family History:   Family History  Problem Relation Age of Onset  . Heart disease Mother   . Heart attack Father   . Vascular Disease Sister       ROS:   Please see the history of present illness.   Review of Systems  Cardiovascular: Positive for chest pain and dyspnea on exertion.  Neurological: Positive for dizziness.  Psychiatric/Behavioral: Positive for depression.  All other systems reviewed and are negative.     PHYSICAL EXAM: VS:  BP 120/70 mmHg  Pulse 86  Ht 5\' 8"  (1.727 m)  Wt 215 lb 9.6 oz (97.796 kg)  BMI 32.79 kg/m2    Wt Readings from Last 3 Encounters:  10/04/15 215 lb 9.6 oz (97.796 kg)  09/27/15 215 lb 3.2 oz (97.614 kg)  09/20/15 214 lb 4.6 oz (97.2 kg)     GEN: Well nourished, well developed, in no acute  distress HEENT: normal Neck: no JVD,  no masses Cardiac:  Normal S1/S2, RRR; no murmur ,  no rubs or gallops, no edema. Respiratory:  clear to auscultation bilaterally, no wheezing, rhonchi or rales. GI: soft, nontender, nondistended, + BS MS: no deformity or atrophy Skin: warm and dry  Neuro:  CNs II-XII intact, Strength and sensation are intact Psych: Normal affect   EKG:  EKG is ordered today.  It demonstrates:   NSR, HR 86, LAD, NSSTTW changes, QTc 454 ms   Recent Labs: 09/08/2015: ALT 22 09/20/2015: BUN 20; Creatinine, Ser 1.35*; Hemoglobin 12.7*; Platelets 162; Potassium 4.1; Sodium 138    Lipid Panel    Component Value Date/Time   CHOL 138 09/08/2015 0933   TRIG 275* 09/08/2015 0933   HDL 28* 09/08/2015 0933   CHOLHDL 4.9 09/08/2015 0933   VLDL 55* 09/08/2015 0933   LDLCALC 55 09/08/2015 0933   LDLDIRECT 76.0 02/28/2015 0939      ASSESSMENT AND PLAN:  1. CAD:  Hx of CABG.  S/p recent LHC for [place] demonstrating high grade graft disease in the S-RI/OM2 treated with Synergy DES.  S-PDA and native RCA known to be occluded.  He has L >> R collaterals.  He needs continued dual antiplatelet Rx for minimum of 1 year.  Since I last saw him, the chest symptoms and SOB have resolved.  He is feeling much better.  Continue ASA, Brilinta, statin, beta-blocker. Refer to Cardiac Rehab.   2. HTN:  Controlled.   3. Hyperlipidemia:  Continue statin.  LDL in 11/16 was 55.  4. OSA:  Continue CPAP.  5. Diabetes Mellitus:  FU with PCP.  6. Dental Pain:  He needs a root canal.  I discussed with Dr. Daneen Schick last and we  recommended waiting 4 weeks post PCI before proceeding with any dental procedure to allow time for some endothelialization of the stent. His pain is better on antibiotics.  He will FU with his endodontist.        Medication Changes: Current medicines are reviewed at length with the patient today.  Concerns regarding medicines are as outlined above.  The following  changes have been made:   Discontinued Medications   No medications on file   Modified Medications   Modified Medication Previous Medication   METOPROLOL SUCCINATE (TOPROL-XL) 50 MG 24 HR TABLET metoprolol succinate (TOPROL-XL) 50 MG 24 hr tablet      Take 1 tablet (50 mg total) by mouth daily.    Take 1 tablet (50 mg total) by mouth 2 (two) times daily.   New Prescriptions   No medications on file   Labs/ tests ordered today include:   Orders Placed This Encounter  Procedures  . EKG 12-Lead     Disposition:    FU with Dr. Peter Martinique 2-3 mos.     Signed, Versie Starks, MHS 10/04/2015 5:38 PM    [facility] [place], [place], [place]  [address] Phone: [phone]; Fax: [phone]

## 2015-10-04 ENCOUNTER — Ambulatory Visit (INDEPENDENT_AMBULATORY_CARE_PROVIDER_SITE_OTHER): Payer: Commercial Managed Care - HMO | Admitting: Physician Assistant

## 2015-10-04 ENCOUNTER — Encounter: Payer: Self-pay | Admitting: Physician Assistant

## 2015-10-04 VITALS — BP 120/70 | HR 86 | Ht 68.0 in | Wt 215.6 lb

## 2015-10-04 DIAGNOSIS — G4730 Sleep apnea, unspecified: Secondary | ICD-10-CM

## 2015-10-04 DIAGNOSIS — R931 Abnormal findings on diagnostic imaging of heart and coronary circulation: Secondary | ICD-10-CM

## 2015-10-04 DIAGNOSIS — E785 Hyperlipidemia, unspecified: Secondary | ICD-10-CM

## 2015-10-04 DIAGNOSIS — I2510 Atherosclerotic heart disease of native coronary artery without angina pectoris: Secondary | ICD-10-CM

## 2015-10-04 DIAGNOSIS — I10 Essential (primary) hypertension: Secondary | ICD-10-CM | POA: Diagnosis not present

## 2015-10-04 DIAGNOSIS — K0889 Other specified disorders of teeth and supporting structures: Secondary | ICD-10-CM

## 2015-10-04 MED ORDER — METOPROLOL SUCCINATE ER 50 MG PO TB24: 50.0000 mg | ORAL_TABLET | Freq: Every day | ORAL | Status: DC

## 2015-10-04 NOTE — Patient Instructions (Addendum)
Medication Instructions:  CONTINUE ON TOPROL XL 50 MG DAILY  Labwork: NONE  Testing/Procedures: NONE  Follow-Up: DR. Martinique 2-3 MONTHS  Any Other Special Instructions Will Be Listed Below (If Applicable). CARDIAC REHAB AT   If you need a refill on your cardiac medications before your next appointment, please call your pharmacy.

## 2015-10-11 ENCOUNTER — Telehealth: Payer: Self-pay | Admitting: Cardiology

## 2015-10-11 NOTE — Telephone Encounter (Signed)
Spoke with pt. He would like to speak with Dr. Mare Ferrari regarding personal issues related to stent placement.  Pt made aware Dr. Mare Ferrari is not in office today but I would forward message him.

## 2015-10-11 NOTE — Telephone Encounter (Signed)
New Message  Pt request a call back to discuss personal issues. Please call back to discuss.  (No further details provided)

## 2015-10-12 NOTE — Telephone Encounter (Signed)
Dr. Mare Ferrari spoke with patient and would like patient to get enrolled in cardiac rehab Called cardiac rehab to see if referral pending. Referral there and will fax again for  Dr. Mare Ferrari to sign since first fax not received

## 2015-10-13 ENCOUNTER — Ambulatory Visit: Payer: Commercial Managed Care - HMO | Admitting: Cardiology

## 2015-10-13 NOTE — Telephone Encounter (Signed)
Never received fax Filled out new form and given to medical records earlier today for faxing

## 2015-10-20 ENCOUNTER — Encounter: Payer: Self-pay | Admitting: *Deleted

## 2015-10-20 DIAGNOSIS — Z006 Encounter for examination for normal comparison and control in clinical research program: Secondary | ICD-10-CM

## 2015-10-20 NOTE — Progress Notes (Signed)
TWILIGHT research 1 month telephone visit completed. Patient denies any adverse or bleeding events. States "pretty compliant with medication, may have missed 1 here and there). Questions encouraged and answered. Next research visit feb 21 for Randomization.

## 2015-11-08 ENCOUNTER — Telehealth (HOSPITAL_COMMUNITY): Payer: Self-pay | Admitting: *Deleted

## 2015-11-08 ENCOUNTER — Telehealth: Payer: Self-pay | Admitting: *Deleted

## 2015-11-08 NOTE — Telephone Encounter (Signed)
Spoke with patient at the request of Verdis Frederickson at cardiac rehab Patient has been having problems with being lightheaded mostly when he goes from a sitting to standing position. He does not take his blood pressure during these times Patient had angina last week Verdis Frederickson wanted patient seen before starting cardiac rehab Offered patient appointment this week with PA/NP but preferred seeing  Dr. Mare Ferrari  Scheduled appointment for patient next week at patient request

## 2015-11-09 ENCOUNTER — Other Ambulatory Visit: Payer: Self-pay | Admitting: Cardiology

## 2015-11-10 ENCOUNTER — Encounter (HOSPITAL_COMMUNITY)
Admission: RE | Admit: 2015-11-10 | Discharge: 2015-11-10 | Disposition: A | Discharge status: Home / Self Care | Payer: PPO | Source: Ambulatory Visit | Attending: Cardiology | Admitting: Cardiology

## 2015-11-10 DIAGNOSIS — Z955 Presence of coronary angioplasty implant and graft: Secondary | ICD-10-CM | POA: Insufficient documentation

## 2015-11-10 NOTE — Progress Notes (Signed)
Cardiac Rehab Medication Review by a Pharmacist  Does the patient  feel that his/her medications are working for him/her?  Feels that some medications are working. Complains of fatigue and SOB. Doesn't feel as well as he thought he would. He doesn't feel like metformin works that well.  Has the patient been experiencing any side effects to the medications prescribed?  Upset stomach with clindamycin. Has reported dizziness as well. No other side effects reported.  Does the patient measure his/her own blood pressure or blood glucose at home?  Sometimes measures BG at home. Starting to measure BP.   Does the patient have any problems obtaining medications due to transportation or finances?   no  Understanding of regimen: excellent Understanding of indications: excellent Potential of compliance: good Sometimes forgets but his wife is good at reminding him.   Pharmacist comments: Mr. Sauvageau has a good understanding of his medication regimen and does report some side effects. He is going to see his physician next week to address the dizziness, fatigue, and SOB.    Roma Schanz 11/10/2015 8:54 AM

## 2015-11-14 ENCOUNTER — Encounter: Payer: Self-pay | Admitting: Cardiology

## 2015-11-14 ENCOUNTER — Ambulatory Visit (INDEPENDENT_AMBULATORY_CARE_PROVIDER_SITE_OTHER): Payer: PPO | Admitting: Cardiology

## 2015-11-14 ENCOUNTER — Encounter (HOSPITAL_COMMUNITY): Payer: PPO

## 2015-11-14 ENCOUNTER — Ambulatory Visit
Admission: RE | Admit: 2015-11-14 | Discharge: 2015-11-14 | Disposition: A | Discharge status: Home / Self Care | Payer: PPO | Source: Ambulatory Visit | Attending: Cardiology | Admitting: Cardiology

## 2015-11-14 VITALS — BP 130/70 | HR 78 | Ht 69.0 in | Wt 215.4 lb

## 2015-11-14 DIAGNOSIS — R0602 Shortness of breath: Secondary | ICD-10-CM

## 2015-11-14 DIAGNOSIS — I10 Essential (primary) hypertension: Secondary | ICD-10-CM

## 2015-11-14 DIAGNOSIS — I2510 Atherosclerotic heart disease of native coronary artery without angina pectoris: Secondary | ICD-10-CM | POA: Diagnosis not present

## 2015-11-14 DIAGNOSIS — E785 Hyperlipidemia, unspecified: Secondary | ICD-10-CM | POA: Diagnosis not present

## 2015-11-14 DIAGNOSIS — R0609 Other forms of dyspnea: Secondary | ICD-10-CM

## 2015-11-14 MED ORDER — CLOPIDOGREL BISULFATE 75 MG PO TABS: 75.0000 mg | ORAL_TABLET | Freq: Every day | ORAL | Status: DC

## 2015-11-14 MED ORDER — PANTOPRAZOLE SODIUM 20 MG PO TBEC: 20.0000 mg | DELAYED_RELEASE_TABLET | Freq: Every day | ORAL | Status: DC

## 2015-11-14 NOTE — Progress Notes (Signed)
Cardiology Office Note   Date:  11/14/2015   ID:  Paul [name], DOB May 09, 1946, MRN [id number]  PCP:  [name], MD  Cardiologist: Darlin Coco MD  Chief Complaint  Patient presents with  . Chest Pain  . Shortness of Breath    Patient denies le edema  . Dizziness      History of Present Illness: Paul [name] is a 70 y.o. male who presents for work in office visit. The patient has a history of known ischemic heart disease.  He has a history of prior coronary artery bypass graft surgery.  He presented with recurrent angina pectoris and on 09/19/15 underwent left heart cardiac catheterization and PCI by Dr. Martinique.  He was placed on aspirin and Brilinta.  He comes in today complaining of shortness of breath.  He feels that the shortness of breath started about the time that he started his Brilinta.  He is not coughing up any sputum he has not had any evidence of fluid retention or peripheral edema.  He has also had some occasional high left lateral anterior chest discomfort.  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  He has also had some episodes of dizziness when he stands up from a chair.    Past Medical History  Diagnosis Date  . Hypertension   . Coronary artery disease     a. s/p CABG;  b. LHC 11/16: pLAD 70, dLAD 50, D1 100, oRI 100, mLCx 60, OM2 99, pRCA 99 (L>R collats), S-RPDA 100, S-D1 ok, S-RI/OM2 mid 85 (PCI: Synergy DES), L-LAD ok, EF 55-65%  . Diabetes mellitus   . Hyperlipidemia   . GERD (gastroesophageal reflux disease)   . Allergy   . History of nuclear stress test     a. Myoview 11/16:  inferior and inferolateral infarct with peri-infarct ischemia, EF 56%; Intermediate Risk >> LHC >> PCI to S-RI/OM2   . Prostate cancer ([place])   . OSA (obstructive sleep apnea)     CPAP  . Hypothyroidism    . Depression     Past Surgical History  Procedure Laterality Date  . Coronary artery bypass graft    . Cholecystectomy    . Prostate surgery    . Joint replacement      right knee  . Cardiac catheterization N/A 09/19/2015    Procedure: Coronary Stent Intervention;  Surgeon: Peter M Martinique, MD;  Location: [facility];  Service: Cardiovascular;  Laterality: N/A;  . Cardiac catheterization  09/19/2015    Procedure: Left Heart Cath and Cors/Grafts Angiography;  Surgeon: Peter M Martinique, MD;  Location: [facility];  Service: Cardiovascular;;     Current Outpatient Prescriptions  Medication Sig Dispense Refill  . amLODipine (NORVASC) 5 MG tablet TAKE ONE TABLET BY MOUTH ONCE DAILY 90 tablet 3  . aspirin 81 MG tablet Take 81 mg by mouth daily.    . clindamycin (CLEOCIN) 150 MG capsule Reported on 11/10/2015    . clindamycin (CLEOCIN) 300 MG capsule Take 600 mg by mouth See admin instructions. Only prior to dental procedures    . colestipol (COLESTID) 1 G tablet Take 1 g by mouth daily.    . DULoxetine (CYMBALTA) 60 MG capsule Take 60 mg by mouth daily.    Marland Kitchen EPINEPHrine (EPIPEN) 0.3 mg/0.3 mL DEVI Inject 0.3  mLs (0.3 mg total) into the muscle once. 1 Device PRN  . fluticasone (FLONASE) 50 MCG/ACT nasal spray Place 2 sprays into the nose daily. 16 g PRN  . GuaiFENesin (MUCINEX PO) Take 600 mg by mouth daily as needed (cough and congestion). As needed    . HYDROcodone-acetaminophen (NORCO/VICODIN) 5-325 MG tablet Take 1-2 tablets by mouth every 6 (six) hours as needed for moderate pain. Reported on 11/10/2015    . levothyroxine (SYNTHROID, LEVOTHROID) 75 MCG tablet Take 75 mcg by mouth daily.    Marland Kitchen liothyronine (CYTOMEL) 5 MCG tablet Take 5 mcg by mouth daily.    Marland Kitchen losartan-hydrochlorothiazide (HYZAAR) 100-25 MG tablet TAKE ONE TABLET BY MOUTH ONCE DAILY 90 tablet 2  . metFORMIN (GLUCOPHAGE) 1000 MG tablet Take 1,000 mg by mouth 2 (two) times daily.    . metoprolol succinate  (TOPROL-XL) 50 MG 24 hr tablet Take 1 tablet (50 mg total) by mouth daily.    . Multiple Vitamin (MULTIVITAMIN) tablet Take 1 tablet by mouth daily. Reported on 11/10/2015    . nitroGLYCERIN (NITROSTAT) 0.4 MG SL tablet Place 1 tablet (0.4 mg total) under the tongue every 5 (five) minutes as needed for chest pain. 25 tablet 12  . ONETOUCH DELICA LANCETS [id number] MISC     . ONETOUCH VERIO test strip 1 each by Other route as needed for other (CHECK BLOOD SUGER).     . potassium citrate (UROCIT-K) 10 MEQ (1080 MG) SR tablet Take 2 tablets by mouth 2 (two) times daily.    . simvastatin (ZOCOR) 20 MG tablet Take 1 tablet (20 mg total) by mouth at bedtime. 90 tablet 3  . vitamin B-12 (CYANOCOBALAMIN) 250 MCG tablet Take 250 mcg by mouth daily.    . clopidogrel (PLAVIX) 75 MG tablet Take 1 tablet (75 mg total) by mouth daily. 90 tablet 3  . pantoprazole (PROTONIX) 20 MG tablet Take 1 tablet (20 mg total) by mouth daily. 90 tablet 3   No current facility-administered medications for this visit.    Allergies:   Brilinta; Yellow jacket venom; Alprazolam; Penicillins; Percocet; and Septra ds    Social History:  The patient  reports that he has never smoked. He does not have any smokeless tobacco history on file. He reports that he does not drink alcohol.   Family History:  The patient's family history includes Heart attack in his father; Heart disease in his mother; Vascular Disease in his sister.    ROS:  Please see the history of present illness.   Otherwise, review of systems are positive for none.   All other systems are reviewed and negative.    PHYSICAL EXAM: VS:  BP 130/70 mmHg  Pulse 78  Ht 5\' 9"  (1.753 m)  Wt 215 lb 6.4 oz (97.705 kg)  BMI 31.79 kg/m2 , BMI Body mass index is 31.79 kg/(m^2). GEN: Well nourished, well developed, in no acute distress HEENT: normal Neck: no JVD, carotid bruits, or masses Cardiac: RRR; no murmurs, rubs, or gallops,no edema  Respiratory:  clear to auscultation  bilaterally, normal work of breathing GI: soft, nontender, nondistended, + BS MS: no deformity or atrophy Skin: warm and dry, no rash Neuro:  Strength and sensation are intact Psych: euthymic mood, full affect   EKG:  EKG is ordered today. The ekg ordered today demonstrates normal sinus rhythm at 78 bpm.  Old inferior wall myocardial infarction.  Nonspecific T-wave flattening.  Since prior tracing of 10/04/15, no significant change   Recent Labs: 09/08/2015: ALT  22 09/20/2015: BUN 20; Creatinine, Ser 1.35*; Hemoglobin 12.7*; Platelets 162; Potassium 4.1; Sodium 138    Lipid Panel    Component Value Date/Time   CHOL 138 09/08/2015 0933   TRIG 275* 09/08/2015 0933   HDL 28* 09/08/2015 0933   CHOLHDL 4.9 09/08/2015 0933   VLDL 55* 09/08/2015 0933   LDLCALC 55 09/08/2015 0933   LDLDIRECT 76.0 02/28/2015 0939      Wt Readings from Last 3 Encounters:  11/14/15 215 lb 6.4 oz (97.705 kg)  11/10/15 216 lb 7.9 oz (98.2 kg)  10/04/15 215 lb 9.6 oz (97.796 kg)       ASSESSMENT AND PLAN:  1. CAD: Hx of CABG. S/p recent LHC for [place] demonstrating high grade graft disease in the S-RI/OM2 treated with Synergy DES. S-PDA and native RCA known to be occluded. He has L >> R collaterals. He needs continued dual antiplatelet Rx for minimum of 1 year. He has had dyspnea which he thinks started when he started on Brilinta.  No evidence of excess fluid clinically.  We will check a chest x-ray.  I suspect that the dyspnea as a side effect of Brilinta.  We will stop Brilinta and switched to clopidogrel 75 mg daily.  Continue baby aspirin.  2. HTN: Controlled.   3. Hyperlipidemia: Continue statin. LDL in 11/16 was 55.  4. OSA: Continue CPAP.  5. Diabetes Mellitus: FU with PCP.  6.  Dyspepsia.  We will switch him from omeprazole to generic Protonix 20 mg daily Current medicines are reviewed at length with the patient today.  The patient does not have concerns regarding  medicines.  The following changes have been made:  Switch from Brilinta to clopidogrel 75 mg daily which he will start today.  He took his Brilinta this morning  Labs/ tests ordered today include:   Orders Placed This Encounter  Procedures  . DG Chest 2 View  . EKG 12-Lead    Disposition: Follow-up with Dr. Martinique and scheduled.  He has not yet started cardiac rehabilitation.  If his dyspnea improves off Brilinta, anticipate he could start in the program at the end of this week.   Berna Spare MD 11/14/2015 11:04 AM    [address], [place], [place]  [address] Phone: [phone]; Fax: [phone]

## 2015-11-14 NOTE — Patient Instructions (Addendum)
   Medication Instructions:  STOP YOUR BRILINTA AND START PLAVIX 75 MG DAILY STARTING TODAY   STOP OMEPRAZOLE AND START PROTONIX (PANTOPRAZOLE)  20 MG ONE DAILY   Labwork: NONE  Testing/Procedures: A chest x-ray takes a picture of the organs and structures inside the chest, including the heart, lungs, and blood vessels. This test can show several things, including, whether the heart is enlarges; whether fluid is building up in the lungs; and whether pacemaker / defibrillator leads are still in place. GO TO [facility] [place]   Follow-Up: [place] WITH DR Barrett Shell  If you need a refill on your cardiac medications before your next appointment, please call your pharmacy.

## 2015-11-15 ENCOUNTER — Telehealth: Payer: Self-pay | Admitting: Cardiology

## 2015-11-15 ENCOUNTER — Encounter: Payer: Self-pay | Admitting: *Deleted

## 2015-11-15 DIAGNOSIS — Z006 Encounter for examination for normal comparison and control in clinical research program: Secondary | ICD-10-CM

## 2015-11-15 NOTE — Progress Notes (Signed)
TWILIGHT Research study phone call to patient to inform him to return unused study drug to office at his next appointment. Left message for patient to call for any questions. I will attempt to call patient again.

## 2015-11-15 NOTE — Telephone Encounter (Signed)
Informed the pt of his chest x-ray results per Dr Mare Ferrari, mentioned below.  Pt verbalized understanding and agrees with this plan.   Notes Recorded by Darlin Coco, MD on 11/15/2015 at 7:12 AM Please report. The chest xray is normal. No CHF. As discussed, dyspnea likely secondary to Brilinta which was stopped on 11/14/15.

## 2015-11-15 NOTE — Telephone Encounter (Signed)
New Message  Pt stated taht he was returning RN phone call concerning lab results. Please call back and discuss.

## 2015-11-16 ENCOUNTER — Encounter (HOSPITAL_COMMUNITY): Admission: RE | Admit: 2015-11-16 | Payer: PPO | Source: Ambulatory Visit

## 2015-11-16 ENCOUNTER — Telehealth (HOSPITAL_COMMUNITY): Payer: Self-pay | Admitting: *Deleted

## 2015-11-18 ENCOUNTER — Encounter (HOSPITAL_COMMUNITY): Payer: PPO

## 2015-11-18 ENCOUNTER — Telehealth: Payer: Self-pay | Admitting: Cardiology

## 2015-11-18 NOTE — Telephone Encounter (Signed)
Will forward update to  Dr. Mare Ferrari

## 2015-11-18 NOTE — Telephone Encounter (Signed)
New message     Calling to give an update to the nurse on how he is feeling--

## 2015-11-18 NOTE — Telephone Encounter (Signed)
Glad his breathing is better off Brilinta.

## 2015-11-18 NOTE — Telephone Encounter (Signed)
Spoke with patient and he is doing better on Plavix and off Brilinta Discussed with Suanne Marker B PA and ok to start cardiac rehab Advised patient and cardiac rehab

## 2015-11-21 ENCOUNTER — Encounter: Payer: Self-pay | Admitting: *Deleted

## 2015-11-21 ENCOUNTER — Encounter (HOSPITAL_COMMUNITY)
Admission: RE | Admit: 2015-11-21 | Discharge: 2015-11-21 | Disposition: A | Discharge status: Home / Self Care | Payer: PPO | Source: Ambulatory Visit | Attending: Cardiology | Admitting: Cardiology

## 2015-11-21 DIAGNOSIS — Z955 Presence of coronary angioplasty implant and graft: Secondary | ICD-10-CM | POA: Diagnosis not present

## 2015-11-21 DIAGNOSIS — Z006 Encounter for examination for normal comparison and control in clinical research program: Secondary | ICD-10-CM

## 2015-11-21 LAB — GLUCOSE, CAPILLARY
GLUCOSE-CAPILLARY: 222 mg/dL — AB (ref 65–99)
Glucose-Capillary: 253 mg/dL — ABNORMAL HIGH (ref 65–99)

## 2015-11-21 NOTE — Progress Notes (Signed)
Pt started cardiac rehab today.  Pt tolerated light exercise without difficulty. VSS, telemetry-normal sinus rhythm, asymptomatic.  Medication list reconciled.  Pt verbalized compliance with medications and denies barriers to compliance. PSYCHOSOCIAL ASSESSMENT:  PHQ-0. Pt exhibits positive coping skills, hopeful outlook with supportive family. Paul [name] did say he has some family issues he working though at this time. Paul [name] did not elaborate . Emotional support offered. No psychosocial needs identified at this time, no psychosocial interventions necessary.  Paul [name] does not have any particular interest at this time.  Pt cardiac rehab  goal is  to loose weight increase stamina and energy.  Pt encouraged to participate in exercising on your own   to increase ability to achieve these goals.   Pt long term cardiac rehab goal is loose weight.  Pt oriented to exercise equipment and routine.  Understanding verbalized. Paul [name] reported feeling better since his Brillinta was changed to Plavix. Paul [name] had no complaints of shortness of breath today. Oxygen saturation 97% on room air . Will continue to monitor the patient throughout  the program.

## 2015-11-21 NOTE — Progress Notes (Signed)
TWILIGHT research phone call to check on patient after starting plavix due to shortness of breath. Patient doing much better very sorry he couldn't continue research study but I explained that was a common side effect of Brilinta. Patient to return unused study drug to cardiac rehab at his next visit. Questions encouraged and answered.

## 2015-11-23 ENCOUNTER — Encounter (HOSPITAL_COMMUNITY)
Admission: RE | Admit: 2015-11-23 | Discharge: 2015-11-23 | Disposition: A | Discharge status: Home / Self Care | Payer: PPO | Source: Ambulatory Visit | Attending: Cardiology | Admitting: Cardiology

## 2015-11-23 DIAGNOSIS — Z955 Presence of coronary angioplasty implant and graft: Secondary | ICD-10-CM | POA: Diagnosis not present

## 2015-11-23 LAB — GLUCOSE, CAPILLARY
GLUCOSE-CAPILLARY: 233 mg/dL — AB (ref 65–99)
Glucose-Capillary: 221 mg/dL — ABNORMAL HIGH (ref 65–99)

## 2015-11-25 ENCOUNTER — Encounter (HOSPITAL_COMMUNITY)
Admission: RE | Admit: 2015-11-25 | Discharge: 2015-11-25 | Disposition: A | Discharge status: Home / Self Care | Payer: PPO | Source: Ambulatory Visit | Attending: Cardiology | Admitting: Cardiology

## 2015-11-25 DIAGNOSIS — Z955 Presence of coronary angioplasty implant and graft: Secondary | ICD-10-CM | POA: Diagnosis not present

## 2015-11-25 LAB — GLUCOSE, CAPILLARY: Glucose-Capillary: 190 mg/dL — ABNORMAL HIGH (ref 65–99)

## 2015-11-28 ENCOUNTER — Telehealth: Payer: Self-pay | Admitting: Cardiology

## 2015-11-28 ENCOUNTER — Encounter (HOSPITAL_COMMUNITY)
Admission: RE | Admit: 2015-11-28 | Discharge: 2015-11-28 | Disposition: A | Discharge status: Home / Self Care | Payer: PPO | Source: Ambulatory Visit | Attending: Cardiology | Admitting: Cardiology

## 2015-11-28 DIAGNOSIS — Z955 Presence of coronary angioplasty implant and graft: Secondary | ICD-10-CM | POA: Diagnosis not present

## 2015-11-28 LAB — GLUCOSE, CAPILLARY: Glucose-Capillary: 183 mg/dL — ABNORMAL HIGH (ref 65–99)

## 2015-11-28 NOTE — Progress Notes (Signed)
Patient reports that he had some chest discomfort on the walking track that lasted the duration of his 10 minute walk. Mr [facility] rated the pain as a 3-4 on a 1-10 scale. Exercise stopped. Mr [facility] said he has experienced his chest discomfort intermittently ever since he had his open heart surgery and that Dr Mare Ferrari is aware. Exercise stopped. Richardson Landry said the pain went away after resting. Richardson Landry proceeded to exercise on the bike and reported having discomfort again 5 minutes into exercising exercise stopped . Vital signs stabler  Dr Sherryl Barters office called and notfied. 1040. Patient now reports the discomfort feels like a thumb print on his chest. Today's exercise flow sheets with today's ECG tracings faxed to Dr Sherryl Barters office for review. I spoke to Rip Harbour, Dr Malena Catholic nurse and updated her about the patient's complaints. Rip Harbour spoke with the patient and to Dr Mare Ferrari. Dr Mare Ferrari said the patient may continue exercise at cardiac rehab. The patient plans to return to exercise on Wednesday. Will continue to monitor the patient throughout  the program.

## 2015-11-28 NOTE — Telephone Encounter (Signed)
Spoke with Paul [name] at Cardiac Rehab this am Patient c/o chest pains for 10 minutes during exercise, pain about 4-5  Patient rested and then tried the stationary bike Pain returned after about 5 minutes Per patient this has been an issue since he had is bypass years ago Patient denies any shortness of breath or nausea Discussed with  Dr. Mare Ferrari and since this is not a new issue and had prior to recent cath ok to continue cardiac rehab Tiffany Kocher and patient

## 2015-11-28 NOTE — Telephone Encounter (Signed)
Please call asap

## 2015-11-30 ENCOUNTER — Encounter (HOSPITAL_COMMUNITY)
Admission: RE | Admit: 2015-11-30 | Discharge: 2015-11-30 | Disposition: A | Discharge status: Home / Self Care | Payer: PPO | Source: Ambulatory Visit | Attending: Cardiology | Admitting: Cardiology

## 2015-11-30 DIAGNOSIS — Z955 Presence of coronary angioplasty implant and graft: Secondary | ICD-10-CM | POA: Diagnosis not present

## 2015-11-30 NOTE — Progress Notes (Signed)
Patient had no complaints during exercise today. Will continue to monitor the patient throughout  the program.

## 2015-12-02 ENCOUNTER — Encounter (HOSPITAL_COMMUNITY)
Admission: RE | Admit: 2015-12-02 | Discharge: 2015-12-02 | Disposition: A | Discharge status: Home / Self Care | Payer: PPO | Source: Ambulatory Visit | Attending: Cardiology | Admitting: Cardiology

## 2015-12-02 DIAGNOSIS — Z955 Presence of coronary angioplasty implant and graft: Secondary | ICD-10-CM | POA: Diagnosis not present

## 2015-12-02 NOTE — Progress Notes (Signed)
Reviewed home exercise with pt today.  Pt plans to walk and use recumbent bike at home for exercise.  Reviewed THR, pulse, RPE, sign and symptoms, NTG use, and when to call 911 or MD.  Pt voiced understanding. Jessica Hawkins, MA, ACSM RCEP  

## 2015-12-05 ENCOUNTER — Encounter (HOSPITAL_COMMUNITY)
Admission: RE | Admit: 2015-12-05 | Discharge: 2015-12-05 | Disposition: A | Discharge status: Home / Self Care | Payer: PPO | Source: Ambulatory Visit | Attending: Cardiology | Admitting: Cardiology

## 2015-12-05 DIAGNOSIS — Z955 Presence of coronary angioplasty implant and graft: Secondary | ICD-10-CM | POA: Diagnosis not present

## 2015-12-05 NOTE — Progress Notes (Signed)
QUALITY OF LIFE SCORE REVIEW  Pt completed Quality of Life survey as a participant in Cardiac Rehab. Scores 21.0 or below are considered low. Pt score very low in several areas Overall 21.01, Health and Function 20.23, socioeconomic 24.04, physiological and spiritual 20.07, family 20.40. Patient quality of life slightly altered by physical constraints which limits ability to perform as prior to recent cardiac illness.  Paul [name] reports that he has some issues going on with his family he did not want to discuss.  Offered emotional support and reassurance.  Paul [name] says he has a spiritual resource family and friends as support. Will continue to monitor and intervene as necessary.  Will forward to quality of life questionnaire to Dr Sherryl Barters office for review.

## 2015-12-07 ENCOUNTER — Other Ambulatory Visit: Payer: PPO

## 2015-12-07 ENCOUNTER — Other Ambulatory Visit: Payer: Self-pay | Admitting: *Deleted

## 2015-12-07 ENCOUNTER — Encounter (HOSPITAL_COMMUNITY): Payer: PPO

## 2015-12-07 ENCOUNTER — Other Ambulatory Visit (INDEPENDENT_AMBULATORY_CARE_PROVIDER_SITE_OTHER): Payer: PPO

## 2015-12-07 ENCOUNTER — Telehealth (HOSPITAL_COMMUNITY): Payer: Self-pay | Admitting: Family Medicine

## 2015-12-07 DIAGNOSIS — I10 Essential (primary) hypertension: Secondary | ICD-10-CM

## 2015-12-07 DIAGNOSIS — Z79899 Other long term (current) drug therapy: Secondary | ICD-10-CM | POA: Diagnosis not present

## 2015-12-07 DIAGNOSIS — E785 Hyperlipidemia, unspecified: Secondary | ICD-10-CM

## 2015-12-07 LAB — CBC WITH DIFFERENTIAL/PLATELET
BASOS ABS: 0 10*3/uL (ref 0.0–0.1)
BASOS PCT: 0 % (ref 0–1)
EOS ABS: 0.1 10*3/uL (ref 0.0–0.7)
Eosinophils Relative: 2 % (ref 0–5)
HCT: 38 % — ABNORMAL LOW (ref 39.0–52.0)
HEMOGLOBIN: 12.6 g/dL — AB (ref 13.0–17.0)
LYMPHS ABS: 2.1 10*3/uL (ref 0.7–4.0)
Lymphocytes Relative: 38 % (ref 12–46)
MCH: 29.2 pg (ref 26.0–34.0)
MCHC: 33.2 g/dL (ref 30.0–36.0)
MCV: 88.2 fL (ref 78.0–100.0)
MPV: 9.8 fL (ref 8.6–12.4)
Monocytes Absolute: 0.5 10*3/uL (ref 0.1–1.0)
Monocytes Relative: 9 % (ref 3–12)
NEUTROS ABS: 2.9 10*3/uL (ref 1.7–7.7)
NEUTROS PCT: 51 % (ref 43–77)
PLATELETS: 198 10*3/uL (ref 150–400)
RBC: 4.31 MIL/uL (ref 4.22–5.81)
RDW: 14.3 % (ref 11.5–15.5)
WBC: 5.6 10*3/uL (ref 4.0–10.5)

## 2015-12-07 LAB — LIPID PANEL
CHOL/HDL RATIO: 4.8 ratio (ref <=5.0)
Cholesterol: 133 mg/dL (ref 125–200)
HDL: 28 mg/dL — AB (ref >=40)
LDL CALC: 52 mg/dL (ref <130)
TRIGLYCERIDES: 264 mg/dL — AB (ref <150)
VLDL: 53 mg/dL — AB (ref <30)

## 2015-12-07 LAB — BASIC METABOLIC PANEL
BUN: 28 mg/dL — ABNORMAL HIGH (ref 7–25)
CHLORIDE: 99 mmol/L (ref 98–110)
CO2: 24 mmol/L (ref 20–31)
CREATININE: 1.43 mg/dL — AB (ref 0.70–1.25)
Calcium: 9.5 mg/dL (ref 8.6–10.3)
Glucose, Bld: 127 mg/dL — ABNORMAL HIGH (ref 65–99)
POTASSIUM: 4.1 mmol/L (ref 3.5–5.3)
SODIUM: 136 mmol/L (ref 135–146)

## 2015-12-07 LAB — HEPATIC FUNCTION PANEL
ALBUMIN: 4 g/dL (ref 3.6–5.1)
ALK PHOS: 31 U/L — AB (ref 40–115)
ALT: 21 U/L (ref 9–46)
AST: 22 U/L (ref 10–35)
BILIRUBIN TOTAL: 0.7 mg/dL (ref 0.2–1.2)
Bilirubin, Direct: 0.2 mg/dL (ref <=0.2)
Indirect Bilirubin: 0.5 mg/dL (ref 0.2–1.2)
TOTAL PROTEIN: 7.1 g/dL (ref 6.1–8.1)

## 2015-12-07 NOTE — Progress Notes (Signed)
Quick Note:  Please make copy of labs for patient visit. ______ 

## 2015-12-09 ENCOUNTER — Encounter (HOSPITAL_COMMUNITY)
Admission: RE | Admit: 2015-12-09 | Discharge: 2015-12-09 | Disposition: A | Discharge status: Home / Self Care | Payer: PPO | Source: Ambulatory Visit | Attending: Cardiology | Admitting: Cardiology

## 2015-12-09 DIAGNOSIS — Z955 Presence of coronary angioplasty implant and graft: Secondary | ICD-10-CM | POA: Diagnosis not present

## 2015-12-12 ENCOUNTER — Encounter (HOSPITAL_COMMUNITY)
Admission: RE | Admit: 2015-12-12 | Discharge: 2015-12-12 | Disposition: A | Discharge status: Home / Self Care | Payer: PPO | Source: Ambulatory Visit | Attending: Cardiology | Admitting: Cardiology

## 2015-12-12 DIAGNOSIS — Z955 Presence of coronary angioplasty implant and graft: Secondary | ICD-10-CM | POA: Diagnosis not present

## 2015-12-13 ENCOUNTER — Encounter: Payer: Self-pay | Admitting: Cardiology

## 2015-12-14 ENCOUNTER — Ambulatory Visit (INDEPENDENT_AMBULATORY_CARE_PROVIDER_SITE_OTHER): Payer: PPO | Admitting: Cardiology

## 2015-12-14 ENCOUNTER — Encounter: Payer: Self-pay | Admitting: Cardiology

## 2015-12-14 ENCOUNTER — Encounter (HOSPITAL_COMMUNITY)
Admission: RE | Admit: 2015-12-14 | Discharge: 2015-12-14 | Disposition: A | Discharge status: Home / Self Care | Payer: PPO | Source: Ambulatory Visit | Attending: Cardiology | Admitting: Cardiology

## 2015-12-14 VITALS — BP 126/66 | HR 90 | Ht 69.0 in | Wt 214.4 lb

## 2015-12-14 DIAGNOSIS — E7800 Pure hypercholesterolemia, unspecified: Secondary | ICD-10-CM | POA: Diagnosis not present

## 2015-12-14 DIAGNOSIS — I10 Essential (primary) hypertension: Secondary | ICD-10-CM | POA: Diagnosis not present

## 2015-12-14 DIAGNOSIS — Z955 Presence of coronary angioplasty implant and graft: Secondary | ICD-10-CM | POA: Diagnosis not present

## 2015-12-14 DIAGNOSIS — I2510 Atherosclerotic heart disease of native coronary artery without angina pectoris: Secondary | ICD-10-CM

## 2015-12-14 NOTE — Progress Notes (Signed)
Cardiology Office Note   Date:  12/14/2015   ID:  Paul [name], DOB October 28, 1946, MRN [id number]  PCP:  [name], MD  Cardiologist: Darlin Coco MD  No chief complaint on file.     History of Present Illness: Paul [name] is a 70 y.o. male who presents for scheduled follow-up office visit  The patient has a history of known ischemic heart disease. He has a history of prior coronary artery bypass graft surgery. He presented with recurrent angina pectoris and on 09/19/15 underwent left heart cardiac catheterization and PCI by Dr. Martinique. He was placed on aspirin and Brilinta. He returned to the office as a work in on 11/14/15 complaining of dyspnea.  We felt that this was secondary to his Brilinta.  The Brilinta was stopped and he was switched to clopidogrel.  His dyspnea  improved within several days of stopping the Brilinta He has also had some occasional high left lateral anterior chest discomfort. 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 He is considering retiring.  He sleeps poorly secondary to stress.  Physically he has felt stronger since participating in the cardiac rehabilitation program. We reviewed his labs today.  Labs are stable.  He is mildly anemic.  He will restart his over-the-counter multivitamin that he had stopped.  Past Medical History  Diagnosis Date  . Hypertension   . Coronary artery disease     a. s/p CABG;  b. LHC 11/16: pLAD 70, dLAD 50, D1 100, oRI 100, mLCx 60, OM2 99, pRCA 99 (L>R collats), S-RPDA 100, S-D1 ok, S-RI/OM2 mid 85 (PCI: Synergy DES), L-LAD ok, EF 55-65%  . Diabetes mellitus   . Hyperlipidemia   . GERD (gastroesophageal reflux disease)   . Allergy   . History of nuclear stress test     a. Myoview 11/16:  inferior and inferolateral infarct with peri-infarct  ischemia, EF 56%; Intermediate Risk >> LHC >> PCI to S-RI/OM2   . Prostate cancer ([place])   . OSA (obstructive sleep apnea)     CPAP  . Hypothyroidism   . Depression     Past Surgical History  Procedure Laterality Date  . Coronary artery bypass graft    . Cholecystectomy    . Prostate surgery    . Joint replacement      right knee  . Cardiac catheterization N/A 09/19/2015    Procedure: Coronary Stent Intervention;  Surgeon: Peter M Martinique, MD;  Location: [facility];  Service: Cardiovascular;  Laterality: N/A;  . Cardiac catheterization  09/19/2015    Procedure: Left Heart Cath and Cors/Grafts Angiography;  Surgeon: Peter M Martinique, MD;  Location: [facility];  Service: Cardiovascular;;     Current Outpatient Prescriptions  Medication Sig Dispense Refill  . amLODipine (NORVASC) 5 MG tablet TAKE ONE TABLET BY MOUTH ONCE DAILY 90 tablet 3  . aspirin 81 MG tablet Take 81 mg by mouth daily.    . clindamycin (CLEOCIN) 150 MG capsule Reported on 11/28/2015    . clindamycin (CLEOCIN) 300 MG capsule Take 600 mg by mouth daily as needed (1 hour prior to dental procedure). Reported on 11/28/2015    . clopidogrel (PLAVIX) 75 MG tablet Take 1 tablet (75 mg total) by mouth daily. 90 tablet 3  . colestipol (COLESTID) 1 G tablet Take 1 g by  mouth daily.    . DULoxetine (CYMBALTA) 60 MG capsule Take 60 mg by mouth daily.    Marland Kitchen EPINEPHrine (EPIPEN) 0.3 mg/0.3 mL DEVI Inject 0.3 mLs (0.3 mg total) into the muscle once. 1 Device PRN  . fluticasone (FLONASE) 50 MCG/ACT nasal spray Place 2 sprays into the nose daily. 16 g PRN  . GuaiFENesin (MUCINEX PO) Take 600 mg by mouth daily as needed (cough and congestion). As needed    . HYDROcodone-acetaminophen (NORCO/VICODIN) 5-325 MG tablet Take 1-2 tablets by mouth every 6 (six) hours as needed for moderate pain. Reported on 11/28/2015    . levothyroxine (SYNTHROID, LEVOTHROID) 75 MCG tablet Take 75 mcg by mouth daily.    Marland Kitchen liothyronine (CYTOMEL) 5  MCG tablet Take 5 mcg by mouth daily.    Marland Kitchen losartan-hydrochlorothiazide (HYZAAR) 100-25 MG tablet TAKE ONE TABLET BY MOUTH ONCE DAILY 90 tablet 2  . metFORMIN (GLUCOPHAGE) 1000 MG tablet Take 1,000 mg by mouth 2 (two) times daily.    . metoprolol succinate (TOPROL-XL) 50 MG 24 hr tablet Take 1 tablet (50 mg total) by mouth daily.    . Multiple Vitamin (MULTIVITAMIN) tablet Take 1 tablet by mouth daily. Reported on 11/10/2015    . nitroGLYCERIN (NITROSTAT) 0.4 MG SL tablet Place 1 tablet (0.4 mg total) under the tongue every 5 (five) minutes as needed for chest pain. 25 tablet 12  . ONETOUCH DELICA LANCETS 99991111 MISC     . ONETOUCH VERIO test strip 1 each by Other route as needed for other (CHECK BLOOD SUGAR).     Marland Kitchen pantoprazole (PROTONIX) 20 MG tablet Take 1 tablet (20 mg total) by mouth daily. 90 tablet 3  . potassium citrate (UROCIT-K) 10 MEQ (1080 MG) SR tablet Take 2 tablets by mouth 2 (two) times daily.    . simvastatin (ZOCOR) 20 MG tablet Take 1 tablet (20 mg total) by mouth at bedtime. 90 tablet 3  . vitamin B-12 (CYANOCOBALAMIN) 250 MCG tablet Take 250 mcg by mouth daily.     No current facility-administered medications for this visit.    Allergies:   Brilinta; Yellow jacket venom; Alprazolam; Penicillins; Percocet; and Septra ds    Social History:  The patient  reports that he has never smoked. He does not have any smokeless tobacco history on file. He reports that he does not drink alcohol.   Family History:  The patient's family history includes Heart attack in his father; Heart disease in his mother; Vascular Disease in his sister.    ROS:  Please see the history of present illness.   Otherwise, review of systems are positive for none.   All other systems are reviewed and negative.    PHYSICAL EXAM: VS:  BP 126/66 mmHg  Pulse 90  Ht 5\' 9"  (1.753 m)  Wt 214 lb 6.4 oz (97.251 kg)  BMI 31.65 kg/m2 , BMI Body mass index is 31.65 kg/(m^2). GEN: Well nourished, well developed, in  no acute distress HEENT: normal Neck: no JVD, carotid bruits, or masses Cardiac: RRR; no murmurs, rubs, or gallops,no edema  Respiratory:  clear to auscultation bilaterally, normal work of breathing GI: soft, nontender, nondistended, + BS MS: no deformity or atrophy Skin: warm and dry, no rash Neuro:  Strength and sensation are intact Psych: euthymic mood, full affect   EKG:  EKG is not ordered today.   Recent Labs: 12/07/2015: ALT 21; BUN 28*; Creat 1.43*; Hemoglobin 12.6*; Platelets 198; Potassium 4.1; Sodium 136    Lipid Panel  Component Value Date/Time   CHOL 133 12/07/2015 0849   TRIG 264* 12/07/2015 0849   HDL 28* 12/07/2015 0849   CHOLHDL 4.8 12/07/2015 0849   VLDL 53* 12/07/2015 0849   LDLCALC 52 12/07/2015 0849   LDLDIRECT 76.0 02/28/2015 0939      Wt Readings from Last 3 Encounters:  12/14/15 214 lb 6.4 oz (97.251 kg)  11/14/15 215 lb 6.4 oz (97.705 kg)  11/10/15 216 lb 7.9 oz (98.2 kg)         ASSESSMENT AND PLAN:   1. CAD: Hx of CABG. S/p recent LHC for [place] demonstrating high grade graft disease in the S-RI/OM2 treated with Synergy DES. S-PDA and native RCA known to be occluded. He has L >> R collaterals. He needs continued dual antiplatelet Rx for minimum of 1 year. Now on aspirin and clopidogrel.  Recent chest x-ray was unremarkable.    2. HTN: Controlled.   3. Hyperlipidemia: Continue statin. Lab work was reviewed today and was satisfactory.  4. OSA: Continue CPAP.  5. Diabetes Mellitus: FU with Dr. Buddy Duty..  Recent blood sugars are improved.  6. Dyspepsia. No symptoms.  On generic Protonix.   Current medicines are reviewed at length with the patient today.  The patient does not have concerns regarding medicines.  The following changes have been made:  no change  Labs/ tests ordered today include:  No orders of the defined types were placed in this encounter.    Disposition: Continue current medication.  Physically he does  feel stronger.  He still is under a lot of stress.  He is considering retirement. We will have him follow up as scheduled with Dr. Peter Martinique on 01/18/16.  Paul Spare MD 12/14/2015 1:11 PM    [address], [place], [place]  [address] Phone: [phone]; Fax: [phone]

## 2015-12-14 NOTE — Patient Instructions (Signed)
Medication Instructions:  Your physician recommends that you continue on your current medications as directed. Please refer to the Current Medication list given to you today.  Labwork: NONE  Testing/Procedures: NONE  Follow-Up: KEEP YOUR APPOINTMENT WITH DR Martinique   If you need a refill on your cardiac medications before your next appointment, please call your pharmacy.

## 2015-12-16 ENCOUNTER — Encounter (HOSPITAL_COMMUNITY)
Admission: RE | Admit: 2015-12-16 | Discharge: 2015-12-16 | Disposition: A | Discharge status: Home / Self Care | Payer: PPO | Source: Ambulatory Visit | Attending: Cardiology | Admitting: Cardiology

## 2015-12-16 DIAGNOSIS — Z955 Presence of coronary angioplasty implant and graft: Secondary | ICD-10-CM | POA: Diagnosis not present

## 2015-12-19 ENCOUNTER — Encounter (HOSPITAL_COMMUNITY)
Admission: RE | Admit: 2015-12-19 | Discharge: 2015-12-19 | Disposition: A | Discharge status: Home / Self Care | Payer: PPO | Source: Ambulatory Visit | Attending: Cardiology | Admitting: Cardiology

## 2015-12-19 DIAGNOSIS — Z955 Presence of coronary angioplasty implant and graft: Secondary | ICD-10-CM | POA: Diagnosis not present

## 2015-12-19 LAB — GLUCOSE, CAPILLARY: Glucose-Capillary: 276 mg/dL — ABNORMAL HIGH (ref 65–99)

## 2015-12-19 NOTE — Progress Notes (Addendum)
Paul [name] 70 y.o. male Nutrition Note Spoke with pt. Pt reports his CBG in his car prior to rehab was 316 mg/dL. Pre-exercise CBG in rehab 276 mg/dL. Pt states he feels his glucometer may be wrong because "I used expired strips." Pt CBG's are significantly higher than desired. Pt reports eating egg beaters with 2 slices of homemade bread and 4 dried apricots. Per discussion, pt was taking Victoza "about 3 months ago" but Victoza stopped due to pt c/o not being able to smell or taste. Nutrition Plan reviewed with pt. Pt has not returned his MEDFICTS survey at this time. Per discussion, pt states "I've been in this program before and I thought I knew all this stuff." Pt is going to talk with his wife to determine if he wants to fill out the MEDFICTS. Pt is diabetic. Last A1c indicates blood glucose not optimally controlled. This Probation officer went over Diabetes Education test results. Pt does not regularly check CBG's at home "because Dr. Buddy Duty said I didn't need to." Pt feels the need to check CBG's more often now due to hyperglycemia. Pt expressed understanding of the information reviewed. Pt aware of nutrition education classes offered.  Lab Results  Component Value Date   HGBA1C 7.3* 02/28/2015   Wt Readings from Last 3 Encounters:  12/14/15 214 lb 6.4 oz (97.251 kg)  11/14/15 215 lb 6.4 oz (97.705 kg)  11/10/15 216 lb 7.9 oz (98.2 kg)    Nutrition Diagnosis ? Food-and nutrition-related knowledge deficit related to lack of exposure to information as related to diagnosis of: ? CVD ? DM ? Obesity related to excessive energy intake as evidenced by a BMI of 31.8  Nutrition RX/ Estimated Daily Nutrition Needs for: wt loss 1600-2100 Kcal, 45-55 gm fat, 10-14 gm sat fat, 1.6-2.1 gm trans-fat, <1500 mg sodium, 175-250 gm CHO   Nutrition Intervention ? Pt's individual nutrition plan reviewed with pt. ? Pt to attend the Portion Distortion class ? Pt to attend the Diabetes Q & A class ? Pt to attend  the   ? Nutrition I class                   ? Nutrition II class     ? Diabetes Blitz class ? Pt given a glucometer kit, which includes strips ? Pt to schedule an appt with Dr. Buddy Duty ? Continue client-centered nutrition education by RD, as part of interdisciplinary care. Goal(s) ? Pt to identify and limit food sources of saturated fat, trans fat, and sodium ? Pt to identify food quantities necessary to achieve weight loss of 6-24 lb (2.7-10.9 kg) at graduation from cardiac rehab.  ? Use pre-meal and post-meal CBG's and A1c to determine whether adjustments in food/meal planning will be beneficial or if any meds need to be combined with nutrition therapy. Monitor and Evaluate progress toward nutrition goal with team. Derek Mound, M.Ed, RD, LDN, CDE 12/19/2015 10:20 AM

## 2015-12-21 ENCOUNTER — Encounter: Payer: Self-pay | Admitting: Cardiology

## 2015-12-21 ENCOUNTER — Telehealth: Payer: Self-pay | Admitting: Cardiology

## 2015-12-21 ENCOUNTER — Encounter (HOSPITAL_COMMUNITY)
Admission: RE | Admit: 2015-12-21 | Discharge: 2015-12-21 | Disposition: A | Discharge status: Home / Self Care | Payer: PPO | Source: Ambulatory Visit | Attending: Cardiology | Admitting: Cardiology

## 2015-12-21 DIAGNOSIS — Z955 Presence of coronary angioplasty implant and graft: Secondary | ICD-10-CM | POA: Diagnosis not present

## 2015-12-21 LAB — GLUCOSE, CAPILLARY: GLUCOSE-CAPILLARY: 227 mg/dL — AB (ref 65–99)

## 2015-12-21 NOTE — Telephone Encounter (Signed)
New message ° ° ° ° °Talk to Melinda °

## 2015-12-21 NOTE — Telephone Encounter (Signed)
Left message to call back  

## 2015-12-22 NOTE — Telephone Encounter (Signed)
Left message to call back  

## 2015-12-23 ENCOUNTER — Encounter (HOSPITAL_COMMUNITY)
Admission: RE | Admit: 2015-12-23 | Discharge: 2015-12-23 | Disposition: A | Discharge status: Home / Self Care | Payer: PPO | Source: Ambulatory Visit | Attending: Cardiology | Admitting: Cardiology

## 2015-12-23 DIAGNOSIS — Z955 Presence of coronary angioplasty implant and graft: Secondary | ICD-10-CM | POA: Diagnosis not present

## 2015-12-23 LAB — GLUCOSE, CAPILLARY: Glucose-Capillary: 259 mg/dL — ABNORMAL HIGH (ref 65–99)

## 2015-12-23 NOTE — Telephone Encounter (Signed)
Patient is having a tooth extraction and surgeon would like him to hold his Plavix day before, day of, and day after Discussed with  Dr. Mare Ferrari and ok to hold Plavix day before and day of, continue ASA Advised patient, verbalized understanding

## 2015-12-23 NOTE — Telephone Encounter (Signed)
Returning your call. °

## 2015-12-26 ENCOUNTER — Encounter: Payer: Self-pay | Admitting: Cardiology

## 2015-12-26 ENCOUNTER — Encounter (HOSPITAL_COMMUNITY)
Admission: RE | Admit: 2015-12-26 | Discharge: 2015-12-26 | Disposition: A | Discharge status: Home / Self Care | Payer: PPO | Source: Ambulatory Visit | Attending: Cardiology | Admitting: Cardiology

## 2015-12-26 DIAGNOSIS — Z955 Presence of coronary angioplasty implant and graft: Secondary | ICD-10-CM | POA: Diagnosis not present

## 2015-12-26 NOTE — Progress Notes (Signed)
CBG's have been in the mid to upper 200's since the beginning of February. Exercise flow sheets faxed the patient's endocrinologist Dr Buddy Duty to review.

## 2015-12-28 ENCOUNTER — Telehealth (HOSPITAL_COMMUNITY): Payer: Self-pay | Admitting: *Deleted

## 2015-12-28 ENCOUNTER — Encounter (HOSPITAL_COMMUNITY): Admission: RE | Admit: 2015-12-28 | Payer: PPO | Source: Ambulatory Visit

## 2015-12-30 ENCOUNTER — Encounter (HOSPITAL_COMMUNITY)
Admission: RE | Admit: 2015-12-30 | Discharge: 2015-12-30 | Disposition: A | Discharge status: Home / Self Care | Payer: PPO | Source: Ambulatory Visit | Attending: Cardiology | Admitting: Cardiology

## 2015-12-30 DIAGNOSIS — Z955 Presence of coronary angioplasty implant and graft: Secondary | ICD-10-CM | POA: Insufficient documentation

## 2015-12-30 LAB — GLUCOSE, CAPILLARY: GLUCOSE-CAPILLARY: 214 mg/dL — AB (ref 65–99)

## 2016-01-02 ENCOUNTER — Encounter (HOSPITAL_COMMUNITY)
Admission: RE | Admit: 2016-01-02 | Discharge: 2016-01-02 | Disposition: A | Discharge status: Home / Self Care | Payer: PPO | Source: Ambulatory Visit | Attending: Cardiology | Admitting: Cardiology

## 2016-01-02 DIAGNOSIS — Z955 Presence of coronary angioplasty implant and graft: Secondary | ICD-10-CM | POA: Diagnosis not present

## 2016-01-04 ENCOUNTER — Encounter (HOSPITAL_COMMUNITY)
Admission: RE | Admit: 2016-01-04 | Discharge: 2016-01-04 | Disposition: A | Discharge status: Home / Self Care | Payer: PPO | Source: Ambulatory Visit | Attending: Cardiology | Admitting: Cardiology

## 2016-01-04 DIAGNOSIS — E039 Hypothyroidism, unspecified: Secondary | ICD-10-CM | POA: Diagnosis not present

## 2016-01-04 DIAGNOSIS — C61 Malignant neoplasm of prostate: Secondary | ICD-10-CM | POA: Diagnosis not present

## 2016-01-04 DIAGNOSIS — Z955 Presence of coronary angioplasty implant and graft: Secondary | ICD-10-CM | POA: Diagnosis not present

## 2016-01-04 DIAGNOSIS — I2510 Atherosclerotic heart disease of native coronary artery without angina pectoris: Secondary | ICD-10-CM | POA: Diagnosis not present

## 2016-01-04 DIAGNOSIS — E1165 Type 2 diabetes mellitus with hyperglycemia: Secondary | ICD-10-CM | POA: Diagnosis not present

## 2016-01-04 DIAGNOSIS — Z87442 Personal history of urinary calculi: Secondary | ICD-10-CM | POA: Diagnosis not present

## 2016-01-04 DIAGNOSIS — Z794 Long term (current) use of insulin: Secondary | ICD-10-CM | POA: Diagnosis not present

## 2016-01-06 ENCOUNTER — Encounter (HOSPITAL_COMMUNITY)
Admission: RE | Admit: 2016-01-06 | Discharge: 2016-01-06 | Disposition: A | Discharge status: Home / Self Care | Payer: PPO | Source: Ambulatory Visit | Attending: Cardiology | Admitting: Cardiology

## 2016-01-06 DIAGNOSIS — Z955 Presence of coronary angioplasty implant and graft: Secondary | ICD-10-CM | POA: Diagnosis not present

## 2016-01-09 ENCOUNTER — Encounter (HOSPITAL_COMMUNITY): Payer: PPO

## 2016-01-09 ENCOUNTER — Telehealth: Payer: Self-pay | Admitting: Cardiology

## 2016-01-09 NOTE — Telephone Encounter (Signed)
Received records from [facility] for appointment on 01/18/16 with Dr Martinique.  Records given to [employer] (medical records) for Dr Doug Sou schedule on 01/18/16. lp

## 2016-01-11 ENCOUNTER — Encounter (HOSPITAL_COMMUNITY)
Admission: RE | Admit: 2016-01-11 | Discharge: 2016-01-11 | Disposition: A | Discharge status: Home / Self Care | Payer: PPO | Source: Ambulatory Visit | Attending: Cardiology | Admitting: Cardiology

## 2016-01-11 DIAGNOSIS — Z955 Presence of coronary angioplasty implant and graft: Secondary | ICD-10-CM | POA: Diagnosis not present

## 2016-01-13 ENCOUNTER — Encounter (HOSPITAL_COMMUNITY)
Admission: RE | Admit: 2016-01-13 | Discharge: 2016-01-13 | Disposition: A | Discharge status: Home / Self Care | Payer: PPO | Source: Ambulatory Visit | Attending: Cardiology | Admitting: Cardiology

## 2016-01-13 DIAGNOSIS — N200 Calculus of kidney: Secondary | ICD-10-CM | POA: Diagnosis not present

## 2016-01-13 DIAGNOSIS — Z0000 Encounter for general adult medical examination without abnormal findings: Secondary | ICD-10-CM | POA: Diagnosis not present

## 2016-01-13 DIAGNOSIS — Z955 Presence of coronary angioplasty implant and graft: Secondary | ICD-10-CM | POA: Diagnosis not present

## 2016-01-16 ENCOUNTER — Encounter (HOSPITAL_COMMUNITY)
Admission: RE | Admit: 2016-01-16 | Discharge: 2016-01-16 | Disposition: A | Discharge status: Home / Self Care | Payer: PPO | Source: Ambulatory Visit | Attending: Cardiology | Admitting: Cardiology

## 2016-01-16 DIAGNOSIS — Z955 Presence of coronary angioplasty implant and graft: Secondary | ICD-10-CM | POA: Diagnosis not present

## 2016-01-16 NOTE — Progress Notes (Signed)
Medication updates noted

## 2016-01-18 ENCOUNTER — Encounter (HOSPITAL_COMMUNITY): Payer: PPO

## 2016-01-18 ENCOUNTER — Ambulatory Visit (INDEPENDENT_AMBULATORY_CARE_PROVIDER_SITE_OTHER): Payer: PPO | Admitting: Cardiology

## 2016-01-18 ENCOUNTER — Encounter: Payer: Self-pay | Admitting: Cardiology

## 2016-01-18 VITALS — BP 122/76 | HR 94 | Ht 69.0 in | Wt 213.3 lb

## 2016-01-18 DIAGNOSIS — I209 Angina pectoris, unspecified: Secondary | ICD-10-CM

## 2016-01-18 DIAGNOSIS — E785 Hyperlipidemia, unspecified: Secondary | ICD-10-CM

## 2016-01-18 DIAGNOSIS — Z951 Presence of aortocoronary bypass graft: Secondary | ICD-10-CM

## 2016-01-18 DIAGNOSIS — E1169 Type 2 diabetes mellitus with other specified complication: Secondary | ICD-10-CM

## 2016-01-18 DIAGNOSIS — I25810 Atherosclerosis of coronary artery bypass graft(s) without angina pectoris: Secondary | ICD-10-CM | POA: Diagnosis not present

## 2016-01-18 NOTE — Patient Instructions (Signed)
Continue your current therapy  I will see you in 6 months with fasting lab work.   

## 2016-01-18 NOTE — Progress Notes (Signed)
Cardiology Office Note   Date:  01/18/2016   ID:  JOAOVICTOR [name], DOB [birth year], MRN [id number]  PCP:  [name], MD  Cardiologist: Peter Martinique MD  Chief Complaint  Patient presents with  . Follow-up    former Dr. Mare [name] patient  . Coronary Artery Disease      History of Present Illness: Paul [name] is a 70 y.o. male who presents for cardiac follow up. He is a former patient of Dr. Mare [name].   The patient has a history of known ischemic heart disease. He is s/p   coronary artery bypass graft surgery in 2005. He presented with recurrent angina pectoris and on 09/19/15 underwent left heart cardiac catheterization and PCI. LIMA to the LAD and SVG to the diagonal were patent. SVG to the OM had a severe lesion and this was stented. SVG to the RCA was occluded and the RCA had left to left collaterals.  He was placed on aspirin and Brilinta but did not tolerate Brilinta due to dyspnea and fatigue. He was switched to Plavix and these symptoms resolved.    On follow up today he is doing very well. He is participating in [place]. He is planning  retirement from [employer] in April.He denies any chest pain, dyspnea, fatigue or edema. BS are better since he was placed on Jardiance. He was switched from Hyzaar to losartan. BP has been well controlled.   Past Medical History  Diagnosis Date  . Hypertension   . Coronary artery disease     a. s/p CABG;  b. LHC 11/16: pLAD 70, dLAD 50, D1 100, oRI 100, mLCx 60, OM2 99, pRCA 99 (L>R collats), S-RPDA 100, S-D1 ok, S-RI/OM2 mid 85 (PCI: Synergy DES), L-LAD ok, EF 55-65%  . Diabetes mellitus   . Hyperlipidemia   . GERD (gastroesophageal reflux disease)   . Allergy   . History of nuclear stress test     a. Myoview 11/16:  inferior and inferolateral infarct with peri-infarct ischemia, EF 56%; Intermediate Risk >> LHC >> PCI to S-RI/OM2   . Prostate cancer ([place])   . OSA (obstructive sleep apnea)     CPAP  .  Hypothyroidism   . Depression     Past Surgical History  Procedure Laterality Date  . Coronary artery bypass graft    . Cholecystectomy    . Prostate surgery    . Joint replacement      right knee  . Cardiac catheterization N/A 09/19/2015    Procedure: Coronary Stent Intervention;  Surgeon: Peter M Martinique, MD;  Location: [facility];  Service: Cardiovascular;  Laterality: N/A;  . Cardiac catheterization  09/19/2015    Procedure: Left Heart Cath and Cors/Grafts Angiography;  Surgeon: Peter M Martinique, MD;  Location: [facility];  Service: Cardiovascular;;     Current Outpatient Prescriptions  Medication Sig Dispense Refill  . amLODipine (NORVASC) 5 MG tablet TAKE ONE TABLET BY MOUTH ONCE DAILY 90 tablet 3  . aspirin 81 MG tablet Take 81 mg by mouth daily.    . clindamycin (CLEOCIN) 150 MG capsule Reported on 01/16/2016    . clindamycin (CLEOCIN) 300 MG capsule Take 600 mg by mouth daily as needed (1 hour prior to dental procedure). Reported on 01/16/2016    . colestipol (COLESTID) 1 G tablet Take 1 g by mouth daily.    . DULoxetine (CYMBALTA) 60 MG capsule Take 60 mg by mouth daily.    . empagliflozin (JARDIANCE) 10  MG TABS tablet Take 10 mg by mouth daily.    Marland Kitchen EPINEPHrine (EPIPEN) 0.3 mg/0.3 mL DEVI Inject 0.3 mLs (0.3 mg total) into the muscle once. 1 Device PRN  . fluticasone (FLONASE) 50 MCG/ACT nasal spray Place 2 sprays into the nose daily. 16 g PRN  . GuaiFENesin (MUCINEX PO) Take 600 mg by mouth daily as needed (cough and congestion). As needed    . levothyroxine (SYNTHROID, LEVOTHROID) 75 MCG tablet Take 75 mcg by mouth daily.    Marland Kitchen liothyronine (CYTOMEL) 5 MCG tablet Take 5 mcg by mouth daily.    Marland Kitchen losartan (COZAAR) 100 MG tablet Take 100 mg by mouth daily.    . metFORMIN (GLUCOPHAGE) 1000 MG tablet Take 1,000 mg by mouth 2 (two) times daily.    . metoprolol succinate (TOPROL-XL) 50 MG 24 hr tablet Take 1 tablet (50 mg total) by mouth daily.    . Multiple Vitamin  (MULTIVITAMIN) tablet Take 1 tablet by mouth daily. Reported on 11/10/2015    . nitroGLYCERIN (NITROSTAT) 0.4 MG SL tablet Place 1 tablet (0.4 mg total) under the tongue every 5 (five) minutes as needed for chest pain. 25 tablet 12  . ONETOUCH DELICA LANCETS 99991111 MISC     . ONETOUCH VERIO test strip 1 each by Other route as needed for other (CHECK BLOOD SUGAR).     Marland Kitchen pantoprazole (PROTONIX) 20 MG tablet Take 1 tablet (20 mg total) by mouth daily. 90 tablet 3  . potassium citrate (UROCIT-K) 10 MEQ (1080 MG) SR tablet Take 2 tablets by mouth 2 (two) times daily.    . simvastatin (ZOCOR) 20 MG tablet Take 1 tablet (20 mg total) by mouth at bedtime. 90 tablet 3  . vitamin B-12 (CYANOCOBALAMIN) 250 MCG tablet Take 250 mcg by mouth daily.     No current facility-administered medications for this visit.    Allergies:   Brilinta; Yellow jacket venom; Alprazolam; Penicillins; Percocet; and Septra ds    Social History:  The patient  reports that he has never smoked. He does not have any smokeless tobacco history on file. He reports that he does not drink alcohol.   Family History:  The patient's family history includes Heart attack in his father; Heart disease in his mother; Vascular Disease in his sister.    ROS:  Please see the history of present illness.   Otherwise, review of systems are positive for none.   All other systems are reviewed and negative.    PHYSICAL EXAM: VS:  BP 122/76 mmHg  Pulse 94  Ht 5\' 9"  (1.753 m)  Wt 96.752 kg (213 lb 4.8 oz)  BMI 31.48 kg/m2 , BMI Body mass index is 31.48 kg/(m^2). GEN: Well nourished, well developed, in no acute distress HEENT: normal Neck: no JVD, carotid bruits, or masses Cardiac: RRR; no murmurs, rubs, or gallops,no edema  Respiratory:  clear to auscultation bilaterally, normal work of breathing GI: soft, nontender, nondistended, + BS MS: no deformity or atrophy Skin: warm and dry, no rash Neuro:  Strength and sensation are intact Psych:  euthymic mood, full affect   EKG:  EKG is not ordered today.   Recent Labs: 12/07/2015: ALT 21; BUN 28*; Creat 1.43*; Hemoglobin 12.6*; Platelets 198; Potassium 4.1; Sodium 136    Lipid Panel    Component Value Date/Time   CHOL 133 12/07/2015 0849   TRIG 264* 12/07/2015 0849   HDL 28* 12/07/2015 0849   CHOLHDL 4.8 12/07/2015 0849   VLDL 53* 12/07/2015 [id number]  LDLCALC 52 12/07/2015 0849   LDLDIRECT 76.0 02/28/2015 0939      Wt Readings from Last 3 Encounters:  01/18/16 96.752 kg (213 lb 4.8 oz)  12/14/15 97.251 kg (214 lb 6.4 oz)  11/14/15 97.705 kg (215 lb 6.4 oz)    Last A1c 8.6%     ASSESSMENT AND PLAN:   1. CAD: Hx of CABG. S/p LHC for [place] November 2016 demonstrating high grade graft disease in the S-RI/OM2 treated with Synergy DES. S-PDA and native RCA known to be occluded. He has L >> R collaterals. other grafts patent. He needs continued dual antiplatelet Rx for minimum of 1 year. Now on aspirin and clopidogrel.  Currently asymptomatic.   2. HTN: Controlled. Agree with stopping HCTZ.   3. Hyperlipidemia: Continue statin.  4. OSA: Continue CPAP.  5. Diabetes Mellitus: FU with Dr. Buddy Duty.  Recent blood sugars are improved.    Current medicines are reviewed at length with the patient today.  The patient does not have concerns regarding medicines.  The following changes have been made:  no change  Labs/ tests ordered today include:   Orders Placed This Encounter  Procedures  . Basic metabolic panel  . Lipid panel  . Hepatic function panel    Disposition: Continue current medication.  Follow up in 6 months with fasting labs.   Signed, Peter Martinique MD, [facility]    01/18/2016 12:51 PM    [facility]

## 2016-01-20 ENCOUNTER — Encounter (HOSPITAL_COMMUNITY)
Admission: RE | Admit: 2016-01-20 | Discharge: 2016-01-20 | Disposition: A | Discharge status: Home / Self Care | Payer: PPO | Source: Ambulatory Visit | Attending: Cardiology | Admitting: Cardiology

## 2016-01-20 DIAGNOSIS — Z955 Presence of coronary angioplasty implant and graft: Secondary | ICD-10-CM | POA: Diagnosis not present

## 2016-01-23 ENCOUNTER — Encounter (HOSPITAL_COMMUNITY)
Admission: RE | Admit: 2016-01-23 | Discharge: 2016-01-23 | Disposition: A | Discharge status: Home / Self Care | Payer: PPO | Source: Ambulatory Visit | Attending: Cardiology | Admitting: Cardiology

## 2016-01-23 DIAGNOSIS — Z955 Presence of coronary angioplasty implant and graft: Secondary | ICD-10-CM | POA: Diagnosis not present

## 2016-01-24 DIAGNOSIS — L309 Dermatitis, unspecified: Secondary | ICD-10-CM | POA: Diagnosis not present

## 2016-01-24 DIAGNOSIS — L570 Actinic keratosis: Secondary | ICD-10-CM | POA: Diagnosis not present

## 2016-01-24 DIAGNOSIS — D239 Other benign neoplasm of skin, unspecified: Secondary | ICD-10-CM | POA: Diagnosis not present

## 2016-01-25 ENCOUNTER — Encounter (HOSPITAL_COMMUNITY)
Admission: RE | Admit: 2016-01-25 | Discharge: 2016-01-25 | Disposition: A | Discharge status: Home / Self Care | Payer: PPO | Source: Ambulatory Visit | Attending: Cardiology | Admitting: Cardiology

## 2016-01-25 DIAGNOSIS — Z955 Presence of coronary angioplasty implant and graft: Secondary | ICD-10-CM | POA: Diagnosis not present

## 2016-01-26 DIAGNOSIS — G4733 Obstructive sleep apnea (adult) (pediatric): Secondary | ICD-10-CM | POA: Diagnosis not present

## 2016-01-27 ENCOUNTER — Encounter (HOSPITAL_COMMUNITY)
Admission: RE | Admit: 2016-01-27 | Discharge: 2016-01-27 | Disposition: A | Discharge status: Home / Self Care | Payer: PPO | Source: Ambulatory Visit | Attending: Cardiology | Admitting: Cardiology

## 2016-01-27 DIAGNOSIS — Z955 Presence of coronary angioplasty implant and graft: Secondary | ICD-10-CM | POA: Diagnosis not present

## 2016-01-30 ENCOUNTER — Encounter (HOSPITAL_COMMUNITY)
Admission: RE | Admit: 2016-01-30 | Discharge: 2016-01-30 | Disposition: A | Discharge status: Home / Self Care | Payer: PPO | Source: Ambulatory Visit | Attending: Cardiology | Admitting: Cardiology

## 2016-01-30 DIAGNOSIS — Z955 Presence of coronary angioplasty implant and graft: Secondary | ICD-10-CM | POA: Insufficient documentation

## 2016-02-01 ENCOUNTER — Encounter (HOSPITAL_COMMUNITY): Admission: RE | Admit: 2016-02-01 | Payer: PPO | Source: Ambulatory Visit

## 2016-02-01 ENCOUNTER — Telehealth (HOSPITAL_COMMUNITY): Payer: Self-pay | Admitting: *Deleted

## 2016-02-03 ENCOUNTER — Encounter (HOSPITAL_COMMUNITY)
Admission: RE | Admit: 2016-02-03 | Discharge: 2016-02-03 | Disposition: A | Discharge status: Home / Self Care | Payer: PPO | Source: Ambulatory Visit | Attending: Cardiology | Admitting: Cardiology

## 2016-02-03 DIAGNOSIS — Z955 Presence of coronary angioplasty implant and graft: Secondary | ICD-10-CM | POA: Diagnosis not present

## 2016-02-06 ENCOUNTER — Encounter (HOSPITAL_COMMUNITY): Payer: PPO

## 2016-02-08 ENCOUNTER — Encounter (HOSPITAL_COMMUNITY)
Admission: RE | Admit: 2016-02-08 | Discharge: 2016-02-08 | Disposition: A | Discharge status: Home / Self Care | Payer: PPO | Source: Ambulatory Visit | Attending: Cardiology | Admitting: Cardiology

## 2016-02-08 DIAGNOSIS — Z955 Presence of coronary angioplasty implant and graft: Secondary | ICD-10-CM | POA: Diagnosis not present

## 2016-02-10 ENCOUNTER — Encounter (HOSPITAL_COMMUNITY)
Admission: RE | Admit: 2016-02-10 | Discharge: 2016-02-10 | Disposition: A | Discharge status: Home / Self Care | Payer: PPO | Source: Ambulatory Visit | Attending: Cardiology | Admitting: Cardiology

## 2016-02-10 DIAGNOSIS — Z955 Presence of coronary angioplasty implant and graft: Secondary | ICD-10-CM | POA: Diagnosis not present

## 2016-02-13 ENCOUNTER — Encounter (HOSPITAL_COMMUNITY)
Admission: RE | Admit: 2016-02-13 | Discharge: 2016-02-13 | Disposition: A | Discharge status: Home / Self Care | Payer: PPO | Source: Ambulatory Visit | Attending: Cardiology | Admitting: Cardiology

## 2016-02-13 DIAGNOSIS — Z955 Presence of coronary angioplasty implant and graft: Secondary | ICD-10-CM | POA: Diagnosis not present

## 2016-02-15 ENCOUNTER — Encounter (HOSPITAL_COMMUNITY)
Admission: RE | Admit: 2016-02-15 | Discharge: 2016-02-15 | Disposition: A | Discharge status: Home / Self Care | Payer: PPO | Source: Ambulatory Visit | Attending: Cardiology | Admitting: Cardiology

## 2016-02-15 DIAGNOSIS — Z955 Presence of coronary angioplasty implant and graft: Secondary | ICD-10-CM | POA: Diagnosis not present

## 2016-02-17 ENCOUNTER — Encounter (HOSPITAL_COMMUNITY)
Admission: RE | Admit: 2016-02-17 | Discharge: 2016-02-17 | Disposition: A | Discharge status: Home / Self Care | Payer: PPO | Source: Ambulatory Visit | Attending: Cardiology | Admitting: Cardiology

## 2016-02-17 DIAGNOSIS — Z955 Presence of coronary angioplasty implant and graft: Secondary | ICD-10-CM | POA: Diagnosis not present

## 2016-02-20 ENCOUNTER — Encounter (HOSPITAL_COMMUNITY)
Admission: RE | Admit: 2016-02-20 | Discharge: 2016-02-20 | Disposition: A | Discharge status: Home / Self Care | Payer: PPO | Source: Ambulatory Visit | Attending: Cardiology | Admitting: Cardiology

## 2016-02-20 DIAGNOSIS — Z955 Presence of coronary angioplasty implant and graft: Secondary | ICD-10-CM | POA: Diagnosis not present

## 2016-02-20 LAB — GLUCOSE, CAPILLARY: Glucose-Capillary: 148 mg/dL — ABNORMAL HIGH (ref 65–99)

## 2016-02-22 ENCOUNTER — Encounter (HOSPITAL_COMMUNITY)
Admission: RE | Admit: 2016-02-22 | Discharge: 2016-02-22 | Disposition: A | Discharge status: Home / Self Care | Payer: PPO | Source: Ambulatory Visit | Attending: Cardiology | Admitting: Cardiology

## 2016-02-22 DIAGNOSIS — Z955 Presence of coronary angioplasty implant and graft: Secondary | ICD-10-CM | POA: Diagnosis not present

## 2016-02-22 NOTE — Progress Notes (Signed)
Patient post exercise blood pressure 94/60 heart rate 76. Patient given water and Gatorade.Recheck blood pressure 115/70 sitting, standing blood pressure 82/38. Patient complained of feeling lightheaded when changing positions. More Gatorade given. Recheck blood pressure 95/62 sitting. Standing blood pressure 95/63. Symptoms resolved. Danna Hefty NP called and notified and talked with the patient. No new orders received at this time.Will fax exercise flow sheets to Dr. Doug Sou office for review. Exit blood pressure 118/64 upon exit from cardiac rehab. Will continue to monitor the patient throughout  the program. Barnet Pall, RN,BSN 02/22/2016 12:24 PM

## 2016-02-23 DIAGNOSIS — I10 Essential (primary) hypertension: Secondary | ICD-10-CM | POA: Diagnosis not present

## 2016-02-23 DIAGNOSIS — G4733 Obstructive sleep apnea (adult) (pediatric): Secondary | ICD-10-CM | POA: Diagnosis not present

## 2016-02-23 DIAGNOSIS — I2510 Atherosclerotic heart disease of native coronary artery without angina pectoris: Secondary | ICD-10-CM | POA: Diagnosis not present

## 2016-02-23 DIAGNOSIS — N183 Chronic kidney disease, stage 3 (moderate): Secondary | ICD-10-CM | POA: Diagnosis not present

## 2016-02-23 DIAGNOSIS — E785 Hyperlipidemia, unspecified: Secondary | ICD-10-CM | POA: Diagnosis not present

## 2016-02-23 DIAGNOSIS — E1122 Type 2 diabetes mellitus with diabetic chronic kidney disease: Secondary | ICD-10-CM | POA: Diagnosis not present

## 2016-02-23 DIAGNOSIS — F325 Major depressive disorder, single episode, in full remission: Secondary | ICD-10-CM | POA: Diagnosis not present

## 2016-02-23 DIAGNOSIS — E039 Hypothyroidism, unspecified: Secondary | ICD-10-CM | POA: Diagnosis not present

## 2016-02-24 ENCOUNTER — Encounter (HOSPITAL_COMMUNITY)
Admission: RE | Admit: 2016-02-24 | Discharge: 2016-02-24 | Disposition: A | Discharge status: Home / Self Care | Payer: PPO | Source: Ambulatory Visit | Attending: Cardiology | Admitting: Cardiology

## 2016-02-24 DIAGNOSIS — Z955 Presence of coronary angioplasty implant and graft: Secondary | ICD-10-CM | POA: Diagnosis not present

## 2016-02-24 NOTE — Progress Notes (Signed)
Pt graduated from cardiac rehab program today with completion of 36 exercise sessions in Phase II. Pt maintained good attendance and progressed nicely during his participation in rehab as evidenced by increased MET level.   Medication list reconciled. Repeat  PHQ score-  0.  Pt has made significant lifestyle changes and should be commended for his success. Pt feels he has achieved his goals during cardiac rehab.   Pt plans to continue exercise in cardiac maintenance program and by waking and going to the gym.

## 2016-02-27 ENCOUNTER — Encounter (HOSPITAL_COMMUNITY): Payer: PPO

## 2016-02-29 ENCOUNTER — Encounter (HOSPITAL_COMMUNITY): Payer: PPO

## 2016-03-02 ENCOUNTER — Encounter (HOSPITAL_COMMUNITY): Payer: PPO

## 2016-03-05 ENCOUNTER — Encounter (HOSPITAL_COMMUNITY): Payer: PPO

## 2016-03-07 ENCOUNTER — Encounter (HOSPITAL_COMMUNITY): Payer: PPO

## 2016-03-08 ENCOUNTER — Telehealth: Payer: Self-pay | Admitting: Cardiology

## 2016-03-08 MED ORDER — LOSARTAN POTASSIUM 50 MG PO TABS: 50.0000 mg | ORAL_TABLET | Freq: Every day | ORAL | Status: DC

## 2016-03-08 NOTE — Telephone Encounter (Signed)
NeW Message  Pt wanted to discuss pt's issues w/ Losartan- has appt w/ Gerald Stabs 5/25.Marland KitchenPlease call back and discuss.

## 2016-03-08 NOTE — Telephone Encounter (Signed)
Spoke w/ Mr. Zuloaga, goes by "Richardson Landry", he notes he has had some concerns about his BP running low at cardiac rehab. Had some assoc dizziness. Verdis Frederickson had called in for him recently and Ignacia Bayley had taken call. Pt was having BPs 90s/60s. Pt states he wasn't given instructions to discontinue or change dose of his losartan but wonders if we should do anything w/ this med.  Asked about current symptoms & BP trends. Pt reports he has not been having any symptoms w/e of occ dizziness (last episode several days ago) and his BPs he hasn't been checking regularly. He states Bp the other day of 114/70 but no other readings. No HRs reported.  Advised journal/daily log for BPs and HR. Will see Gerald Stabs on 5/25. Pt aware I will forward for any recommendations in the meantime.

## 2016-03-08 NOTE — Telephone Encounter (Signed)
I think it is reasonable to decrease his dose of losartan to 50 mg a day given that he is symptomatic. As you stated have him keep a log and bring it to his visit with Ignacia Bayley. Thank you!

## 2016-03-08 NOTE — Telephone Encounter (Signed)
Recommendations and med instructions given to patient, who verbalized understanding. Rx sent to pharmacy.

## 2016-03-09 ENCOUNTER — Encounter (HOSPITAL_COMMUNITY): Payer: PPO

## 2016-03-12 ENCOUNTER — Other Ambulatory Visit: Payer: Self-pay | Admitting: *Deleted

## 2016-03-12 ENCOUNTER — Encounter (HOSPITAL_COMMUNITY): Payer: PPO

## 2016-03-12 MED ORDER — SIMVASTATIN 20 MG PO TABS: 20.0000 mg | ORAL_TABLET | Freq: Every day | ORAL | Status: DC

## 2016-03-12 MED ORDER — METOPROLOL SUCCINATE ER 50 MG PO TB24: 50.0000 mg | ORAL_TABLET | Freq: Every day | ORAL | Status: DC

## 2016-03-12 NOTE — Telephone Encounter (Signed)
Rx request sent to pharmacy.  

## 2016-03-14 ENCOUNTER — Encounter (HOSPITAL_COMMUNITY): Payer: PPO

## 2016-03-16 ENCOUNTER — Encounter (HOSPITAL_COMMUNITY): Payer: PPO

## 2016-03-16 ENCOUNTER — Telehealth: Payer: Self-pay | Admitting: *Deleted

## 2016-03-16 NOTE — Telephone Encounter (Signed)
Patient called and stated that he has been without the metoprolol for three days. [facility] did not receive the rx. Thanks, MI

## 2016-03-18 IMAGING — CR DG CHEST 2V
2 series · 2 of 2 positions shown · non-contrast
Comparison: September 15, 2015.

CLINICAL DATA: Shortness of breath for 2 months.

EXAM:
CHEST  2 VIEW

[w chest pa]
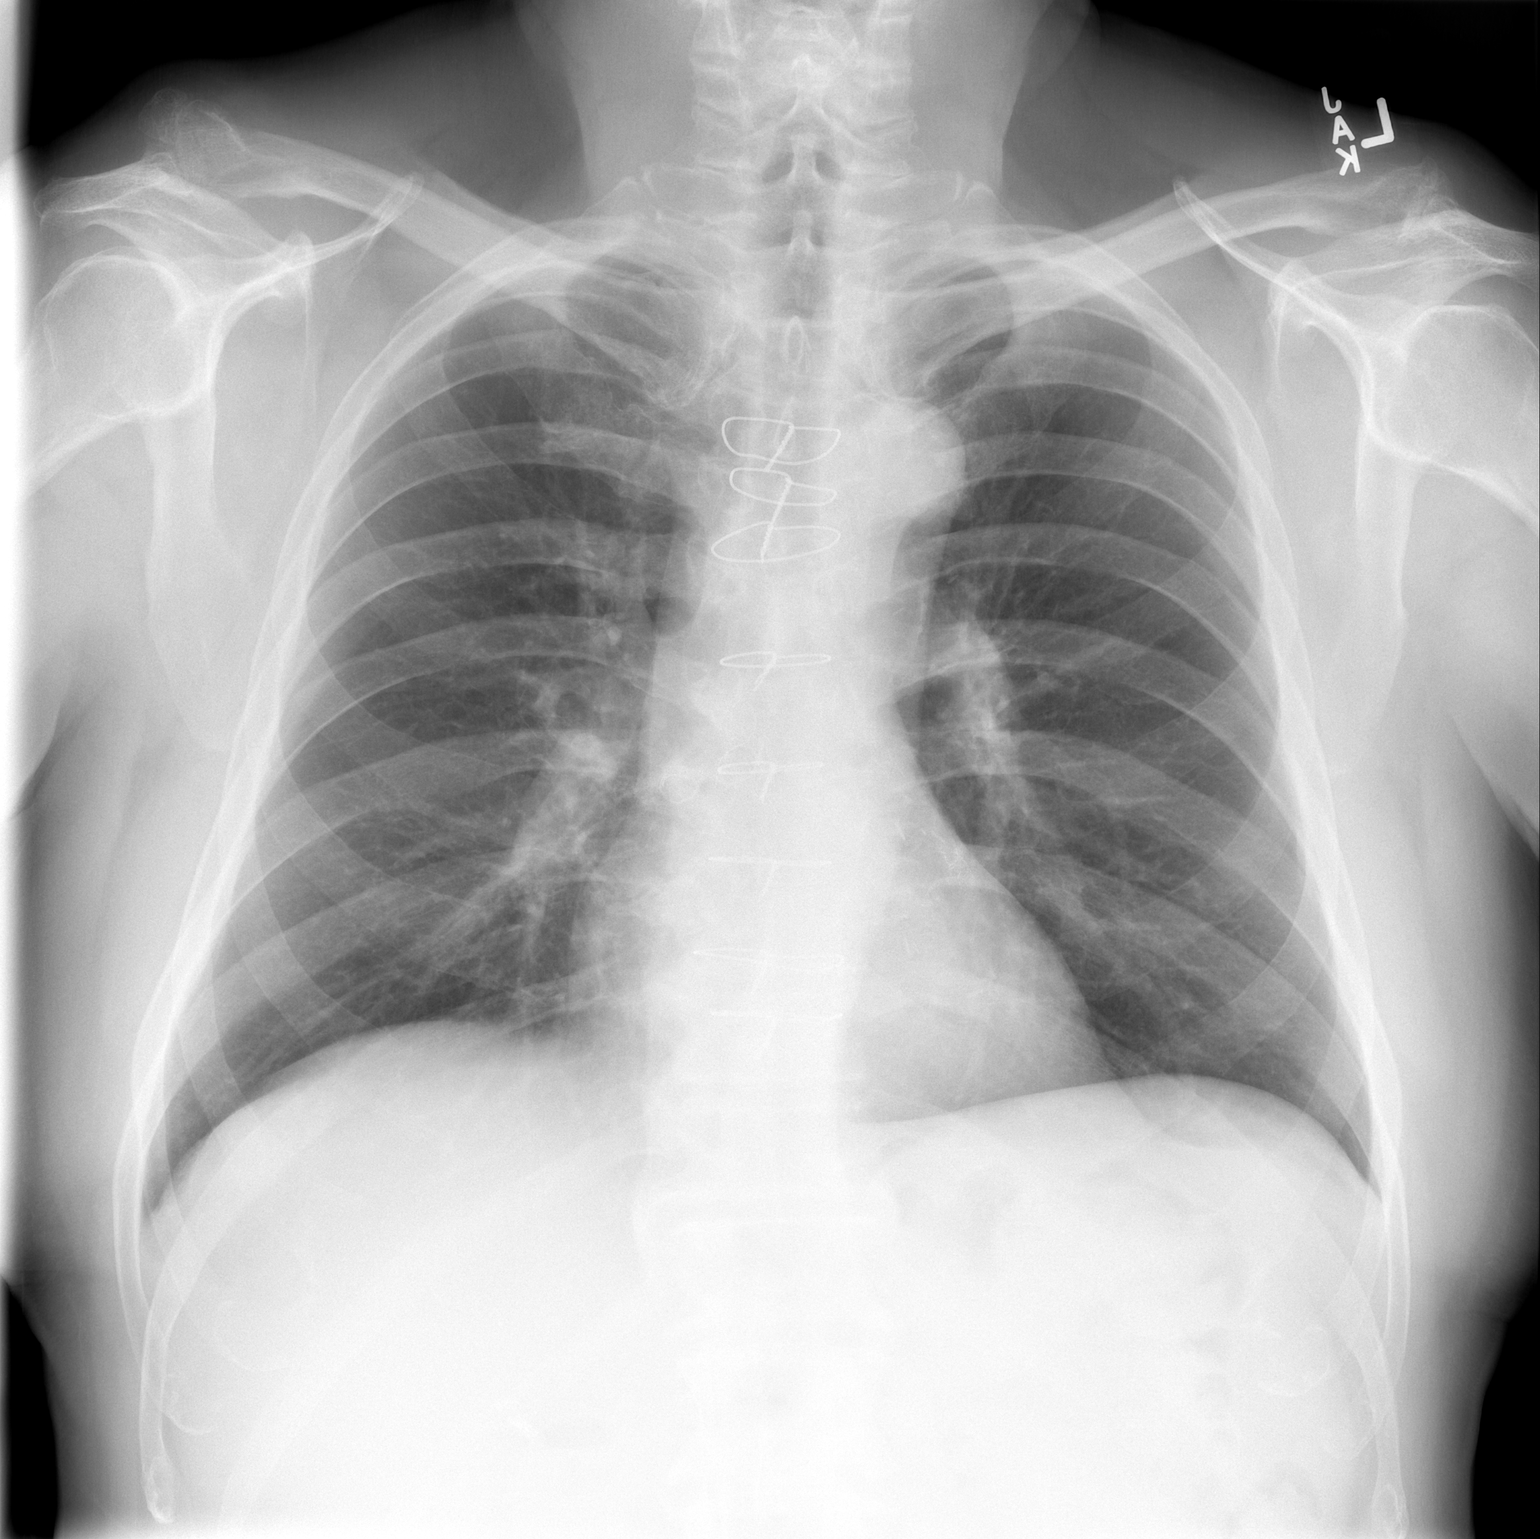

[w chest lat]
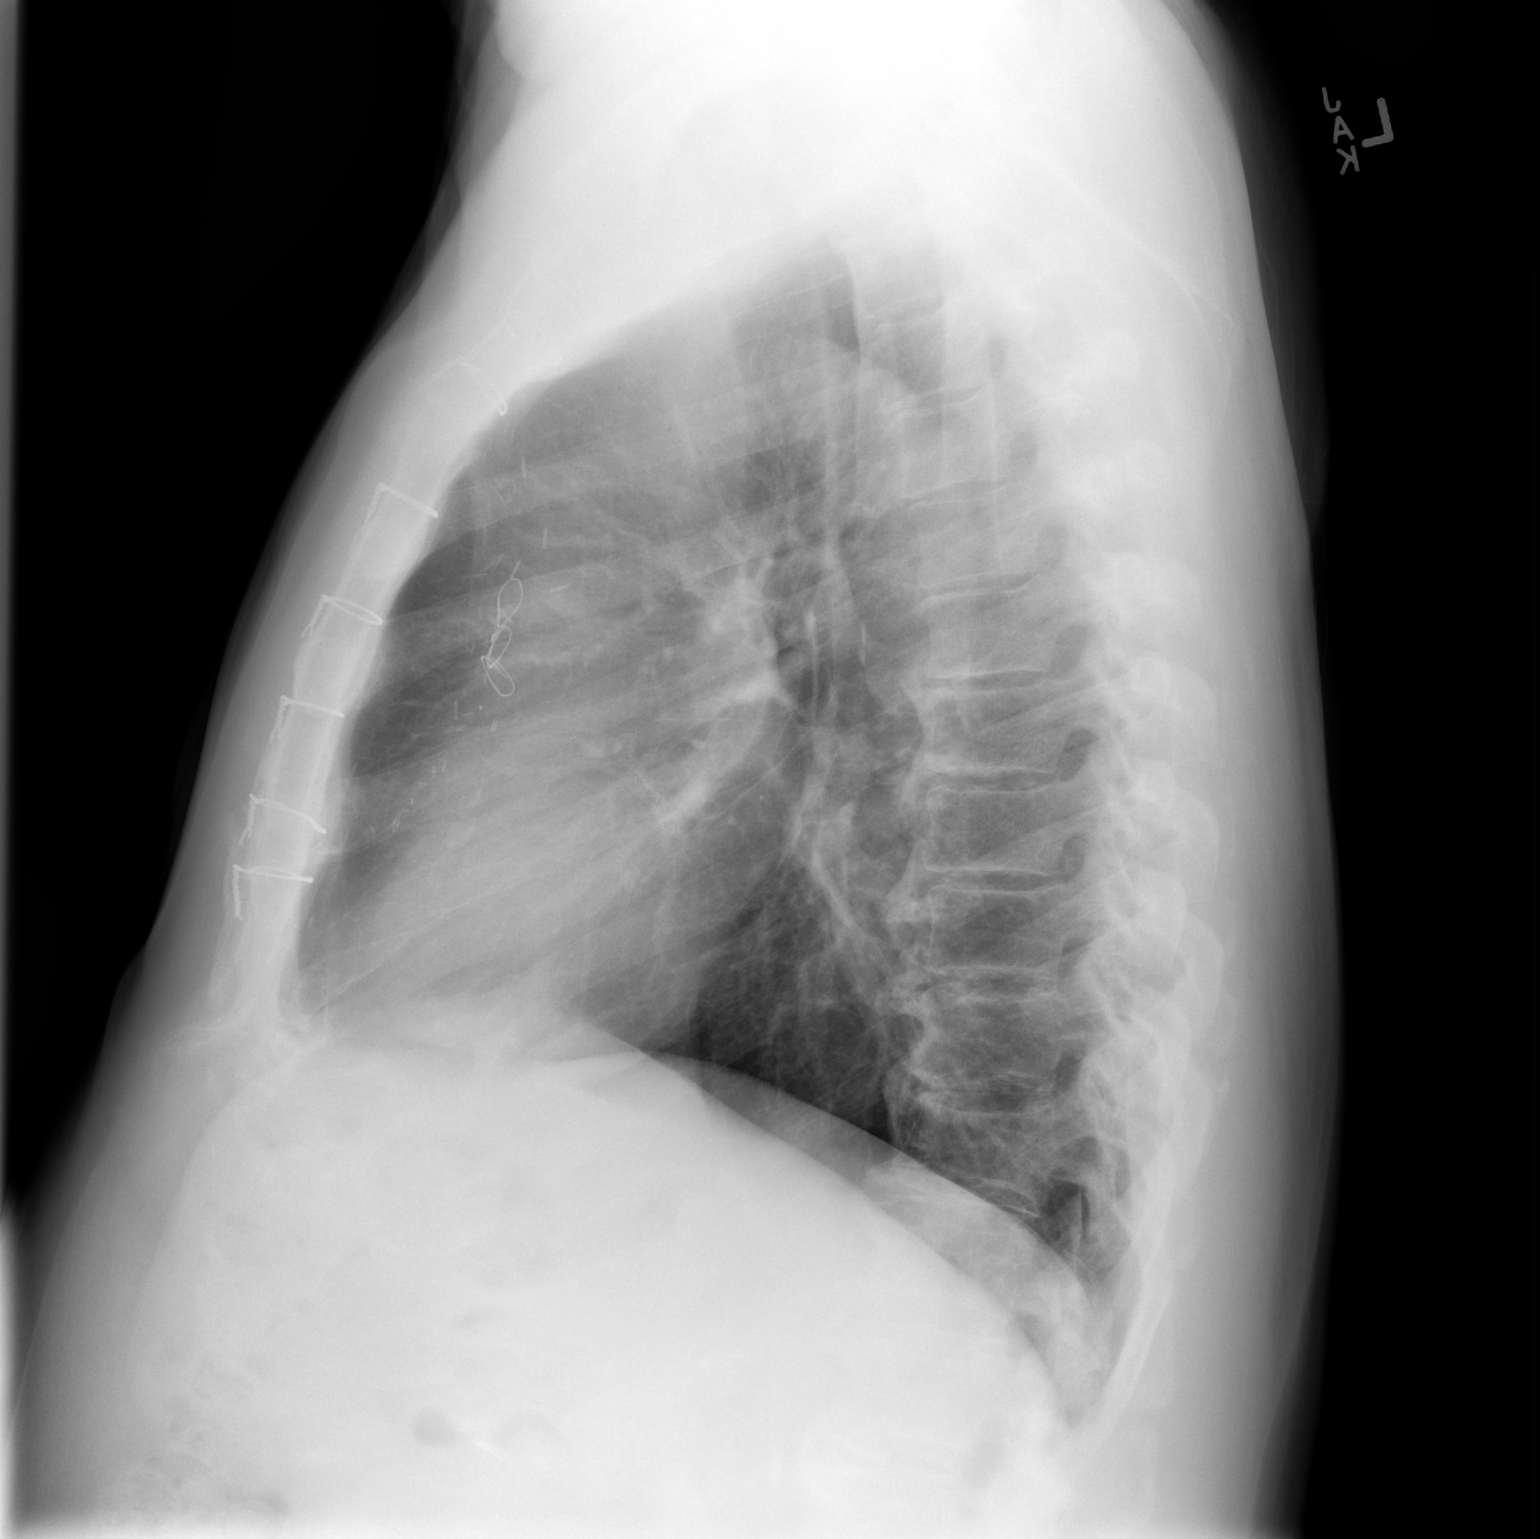

[2 of 2 positions shown; findings below may reference images not displayed]

FINDINGS: The heart size and mediastinal contours are within normal limits.
Both lungs are clear. No pneumothorax or pleural effusion is noted.
Status post coronary artery bypass graft. Anterior osteophyte
formation is noted in lower thoracic spine.
IMPRESSION: No active cardiopulmonary disease.

## 2016-03-19 MED ORDER — METOPROLOL SUCCINATE ER 50 MG PO TB24: 50.0000 mg | ORAL_TABLET | Freq: Every day | ORAL | Status: DC

## 2016-03-19 NOTE — Telephone Encounter (Signed)
Rx(s) sent to pharmacy electronically.  

## 2016-03-22 ENCOUNTER — Encounter: Payer: Self-pay | Admitting: Nurse Practitioner

## 2016-03-22 ENCOUNTER — Ambulatory Visit (INDEPENDENT_AMBULATORY_CARE_PROVIDER_SITE_OTHER): Payer: PPO | Admitting: Nurse Practitioner

## 2016-03-22 VITALS — BP 111/71 | HR 87 | Ht 69.0 in | Wt 205.4 lb

## 2016-03-22 DIAGNOSIS — E785 Hyperlipidemia, unspecified: Secondary | ICD-10-CM | POA: Diagnosis not present

## 2016-03-22 DIAGNOSIS — R42 Dizziness and giddiness: Secondary | ICD-10-CM | POA: Diagnosis not present

## 2016-03-22 DIAGNOSIS — I119 Hypertensive heart disease without heart failure: Secondary | ICD-10-CM

## 2016-03-22 DIAGNOSIS — R5383 Other fatigue: Secondary | ICD-10-CM

## 2016-03-22 DIAGNOSIS — I2510 Atherosclerotic heart disease of native coronary artery without angina pectoris: Secondary | ICD-10-CM

## 2016-03-22 DIAGNOSIS — E119 Type 2 diabetes mellitus without complications: Secondary | ICD-10-CM | POA: Insufficient documentation

## 2016-03-22 DIAGNOSIS — I951 Orthostatic hypotension: Secondary | ICD-10-CM

## 2016-03-22 NOTE — Patient Instructions (Signed)
Paul Bayley, NP, has recommended making the following medication changes: 1. DECREASE Losartan to 25 mg once daily  Gerald Stabs recommends that you schedule a follow-up appointment in SEPTEMBER 2017 with Dr Martinique.

## 2016-03-22 NOTE — Progress Notes (Signed)
Office Visit    Patient Name: Paul [name] [facility] Date of Encounter: 03/22/2016  Primary Care Provider:  [name], MD Primary Cardiologist:  P. Martinique, MD   Chief Complaint    70 year old male with a history of CAD and CABG who presents related to recent symptoms of lightheadedness and some fatigue.  Past Medical History    Past Medical History  Diagnosis Date  . Hypertensive heart disease   . Coronary artery disease     a. s/p CABG;  b. 08/2015 MV: inf/inflat infarct w/ new peri-infarct isch; c. LHC 11/16: pLAD 70, dLAD 50, D1 100, oRI 100, mLCx 60, OM2 99, pRCA 99 (L>R collats), S-RPDA 100, S-D1 ok, S-RI/OM2 mid 85 (PCI: Synergy DES), L-LAD ok, EF 55-65%.  . Type II diabetes mellitus ([place])   . Hyperlipidemia   . GERD (gastroesophageal reflux disease)   . Allergy   . Prostate cancer ([place])   . OSA (obstructive sleep apnea)     CPAP  . Hypothyroidism   . Depression   . Insomnia    Past Surgical History  Procedure Laterality Date  . Coronary artery bypass graft    . Cholecystectomy    . Prostate surgery    . Joint replacement      right knee  . Cardiac catheterization N/A 09/19/2015    Procedure: Coronary Stent Intervention;  Surgeon: Peter M Martinique, MD;  Location: [place] [facility];  Service: Cardiovascular;  Laterality: N/A;  . Cardiac catheterization  09/19/2015    Procedure: Left Heart Cath and Cors/Grafts Angiography;  Surgeon: Peter M Martinique, MD;  Location: [place] [facility];  Service: Cardiovascular;;    Allergies  Allergies  Allergen Reactions  . Brilinta [Ticagrelor] Shortness Of Breath  . Yellow [place]et Venom Anaphylaxis  . Alprazolam     No good  . Penicillins Other (See Comments)    "Eyes swell shut"  . Percocet [Oxycodone-Acetaminophen]     Nightmares/hallucinations  . Septra Ds [Sulfamethoxazole-Trimethoprim]     rash    History of Present Illness    70 year old male with the above past medical history including CAD status post  coronary artery bypass grafting, hypertension, hyperlipidemia, diabetes, hypothyroidism, and obstructive sleep apnea. He was previously a patient of Dr. Mare Ferrari and notes that for a long period of time, he had intermittent shortness of breath and vague fatigue. In November 2016, he also reported chest tightness underwent stress testing which showed a new area of peri-infarct ischemia. This resulted in diagnostic catheterization being performed and revealing severe disease in the vein graft to the OM 2. The vein graft to the PDA was occluded and the distal right coronary artery filled via left-to-right collaterals. The vein graft to the OM 2 was felt to be the culprit and was successfully stented. Following the procedure, patient initially had some shortness of breath which ultimately was secondary to Brilinta and once he was switched to Plavix, he felt significantly better. He did partake in cardiac rehabilitation and notes that he did quite well. Towards the end of cardiac rehabilitation, he was noted to have some low blood pressures and as result, his losartan dose was reduced from 100-50 mg. Now that he is done with cardiac rehabilitation, he is not exercising as much as he was. He notes that from time to time, if he is doing "real physical work" such as digging, carrying a load, or working in the garden, that he fatigues prematurely. That said, he is able to walk a long  distance and had pretty good pace and experience no fatigue, dyspnea, or chest pain. He has not had any chest pain at all since prior to his procedure. Over the past few weeks, he is also noted intermittent lightheadedness. This is most likely to occur with position changes. He does check his blood pressure at home and it usually is in the 120s to 130s. He has not had PND, orthopnea, syncope, palpitations, edema, or early satiety.  Home Medications    Prior to Admission medications   Medication Sig Start Date End Date Taking? Authorizing  Provider  amLODipine (NORVASC) 5 MG tablet TAKE ONE TABLET BY MOUTH ONCE DAILY 05/13/15  Yes Darlin Coco, MD  aspirin 81 MG tablet Take 81 mg by mouth daily.   Yes Historical Provider, MD  clindamycin (CLEOCIN) 150 MG capsule Take 600 mg by mouth once as needed (1 hour prior to dental procedures). Reported on 02/22/2016 09/30/15  Yes Historical Provider, MD  clindamycin (CLEOCIN) 300 MG capsule Take 600 mg by mouth daily as needed (1 hour prior to dental procedure). Reported on 02/22/2016 06/14/13  Yes Historical Provider, MD  clopidogrel (PLAVIX) 75 MG tablet Take 75 mg by mouth daily.   Yes Historical Provider, MD  colestipol (COLESTID) 1 G tablet Take 1 g by mouth daily.   Yes Historical Provider, MD  DULoxetine (CYMBALTA) 60 MG capsule Take 60 mg by mouth daily. 07/11/15  Yes Historical Provider, MD  empagliflozin (JARDIANCE) 10 MG TABS tablet Take 10 mg by mouth daily.   Yes Historical Provider, MD  EPINEPHrine (EPIPEN) 0.3 mg/0.3 mL DEVI Inject 0.3 mLs (0.3 mg total) into the muscle once. 05/22/12  Yes Darlin Coco, MD  fluticasone (FLONASE) 50 MCG/ACT nasal spray Place 2 sprays into the nose daily. 05/22/12  Yes Darlin Coco, MD  GuaiFENesin (MUCINEX PO) Take 600 mg by mouth daily as needed (cough and congestion). As needed   Yes Historical Provider, MD  levothyroxine (SYNTHROID, LEVOTHROID) 75 MCG tablet Take 75 mcg by mouth daily. 07/20/15  Yes Historical Provider, MD  liothyronine (CYTOMEL) 5 MCG tablet Take 5 mcg by mouth daily. 07/20/15  Yes Historical Provider, MD  losartan (COZAAR) 50 MG tablet Take 1 tablet (50 mg total) by mouth daily. 03/08/16  Yes Peter M Martinique, MD  metFORMIN (GLUCOPHAGE) 1000 MG tablet Take 1,000 mg by mouth 2 (two) times daily. 02/13/15  Yes Historical Provider, MD  metoprolol succinate (TOPROL-XL) 50 MG 24 hr tablet Take 1 tablet (50 mg total) by mouth daily. 03/19/16  Yes Peter M Martinique, MD  Multiple Vitamin (MULTIVITAMIN) tablet Take 1 tablet by mouth daily.  Reported on 11/10/2015   Yes Historical Provider, MD  nitroGLYCERIN (NITROSTAT) 0.4 MG SL tablet Place 1 tablet (0.4 mg total) under the tongue every 5 (five) minutes as needed for chest pain. 09/26/15 02/14/18 Yes Darlin Coco, MD  ONETOUCH DELICA LANCETS [id number] MISC  07/20/15  Yes Historical Provider, MD  [facility] VERIO test strip 1 each by Other route as needed for other (CHECK BLOOD SUGAR).  08/23/12  Yes Historical Provider, MD  pantoprazole (PROTONIX) 20 MG tablet Take 1 tablet (20 mg total) by mouth daily. 11/14/15  Yes Darlin Coco, MD  potassium citrate (UROCIT-K) 10 MEQ (1080 MG) SR tablet Take 2 tablets by mouth 2 (two) times daily. 02/16/15  Yes Historical Provider, MD  simvastatin (ZOCOR) 20 MG tablet Take 1 tablet (20 mg total) by mouth at bedtime. 03/12/16  Yes Peter M Martinique, MD  vitamin B-12 (CYANOCOBALAMIN) 250  MCG tablet Take 250 mcg by mouth daily.   Yes Historical Provider, MD    Review of Systems    As above, he has noted some dyspnea on exertion with high levels of activity associated with fatigue. He also has had lightheadedness associated with position changes.  He denies chest pain, palpitations, PND, orthopnea, syncope, edema, or early satiety.  All other systems reviewed and are otherwise negative except as noted above.  Physical Exam    VS:  BP 111/71 mmHg  Pulse 87  Ht 5\' 9"  (1.753 m)  Wt 205 lb 6.4 oz (93.169 kg)  BMI 30.32 kg/m2 , BMI Body mass index is 30.32 kg/(m^2).   Blood pressure lying 111/71, heart rate 85; blood pressure sitting 112/68, heart rate 85; blood pressure standing 96/60, heart rate 85; blood pressure standing 3 minutes 113/68, heart rate 85. GEN: Well nourished, well developed, in no acute distress. HEENT: normal. Neck: Supple, no JVD, carotid bruits, or masses. Cardiac: RRR, no murmurs, rubs, or gallops. No clubbing, cyanosis, edema.  Radials/DP/PT 2+ and equal bilaterally.  Respiratory:  Respirations regular and unlabored, clear to  auscultation bilaterally. GI: Soft, nontender, nondistended, BS + x 4. MS: no deformity or atrophy. Skin: warm and dry, no rash. Neuro:  Strength and sensation are intact. Psych: Normal affect.  Accessory Clinical Findings    ECG - Regular sinus rhythm, 87, small inferior Q's, no acute ST or T changes.  Assessment & Plan    1.  Lightheadedness and fatigue: Since finishing cardiac rehabilitation, patient has noted intermittent lightheadedness and also fatigue with heavy exertional activities such as digging, pulling, or carrying a load. He has not had any fatigue or dyspnea with normal walking and in fact walk this morning and says that he feels great today. He has not been having any chest pain. He is mildly orthostatic on exam today with a drop in his blood pressure to 96/60 with standing. He has been having low blood pressures as noted in cardiac rehabilitation and his losartan dose was previously reduced from 100 mg daily to 50 mg daily. I recommended we dropped this further to 25 mg daily.  2. Coronary artery disease: Status post prior CABG with PCI and gurgling stent placement to the vein graft to the second obtuse marginal in August. Catheterization at that time also revealed a patent LIMA to the LAD and vein graft to the diagonal. The vein graft to the PDA was occluded as was the native right coronary artery, though the distal right coronary was fed via left-to-right collaterals. As above, he has noted some subtle amount of fatigue and reduced exercise tolerance but only in the setting of fairly heavy exertion. He is capable of walking at up to 4 miles per hour for prolonged periods of times without any symptoms or limitations. Overall, he continues to feel better today than he felt prior to his stent placement. He thinks that perhaps after finishing cardiac rehabilitation, he is become somewhat deconditioned as he has not been exercising as much and he does plan to join the [employer]. I  encouraged him to do so and that if he were to experience worsening dyspnea or any amount of chest discomfort, that he would require repeat ischemic evaluation but at this time, that does not appear to be indicated. He remains on aspirin, Plavix, statin, and beta blocker therapy. As above, I am reducing his losartan dose in the setting of intermittent lightheadedness.  3. Hypertensive heart disease: See #1. His  blood pressure has been stable at home and is somewhat soft today with history of orthostasis and lightheadedness. Reducing losartan to 25 mg daily.  4. Hyperlipidemia: Continue statin therapy. His LDL was 52 and February of this year.  5. Disposition: Patient will follow up Dr. Martinique as scheduled in September. In the interim, he will continue to follow his blood pressures at home and will call us sooner if necessary.  Murray Hodgkins, NP 03/22/2016, 4:32 PM

## 2016-04-18 DIAGNOSIS — I2510 Atherosclerotic heart disease of native coronary artery without angina pectoris: Secondary | ICD-10-CM | POA: Diagnosis not present

## 2016-04-18 DIAGNOSIS — E039 Hypothyroidism, unspecified: Secondary | ICD-10-CM | POA: Diagnosis not present

## 2016-04-18 DIAGNOSIS — E1165 Type 2 diabetes mellitus with hyperglycemia: Secondary | ICD-10-CM | POA: Diagnosis not present

## 2016-04-18 DIAGNOSIS — Z7984 Long term (current) use of oral hypoglycemic drugs: Secondary | ICD-10-CM | POA: Diagnosis not present

## 2016-04-18 DIAGNOSIS — Z87442 Personal history of urinary calculi: Secondary | ICD-10-CM | POA: Diagnosis not present

## 2016-04-20 ENCOUNTER — Telehealth: Payer: Self-pay | Admitting: Cardiology

## 2016-04-20 NOTE — Telephone Encounter (Signed)
04/20/2016 Received faxed referral from [place] at [facility] for upcoming appointment with Dr. Martinique on 07/27/2016.  Records given to [facility].  cbr

## 2016-05-09 DIAGNOSIS — C61 Malignant neoplasm of prostate: Secondary | ICD-10-CM | POA: Diagnosis not present

## 2016-05-09 DIAGNOSIS — B356 Tinea cruris: Secondary | ICD-10-CM | POA: Diagnosis not present

## 2016-05-30 ENCOUNTER — Other Ambulatory Visit: Payer: Self-pay

## 2016-05-30 MED ORDER — AMLODIPINE BESYLATE 5 MG PO TABS: 5.0000 mg | ORAL_TABLET | Freq: Every day | ORAL | 3 refills | Status: DC

## 2016-05-30 NOTE — Telephone Encounter (Signed)
Rx request sent to pharmacy.  

## 2016-07-16 ENCOUNTER — Encounter: Payer: Self-pay | Admitting: Cardiology

## 2016-07-20 DIAGNOSIS — I209 Angina pectoris, unspecified: Secondary | ICD-10-CM | POA: Diagnosis not present

## 2016-07-20 DIAGNOSIS — E1169 Type 2 diabetes mellitus with other specified complication: Secondary | ICD-10-CM | POA: Diagnosis not present

## 2016-07-20 DIAGNOSIS — Z951 Presence of aortocoronary bypass graft: Secondary | ICD-10-CM | POA: Diagnosis not present

## 2016-07-20 DIAGNOSIS — E785 Hyperlipidemia, unspecified: Secondary | ICD-10-CM | POA: Diagnosis not present

## 2016-07-20 DIAGNOSIS — I25810 Atherosclerosis of coronary artery bypass graft(s) without angina pectoris: Secondary | ICD-10-CM | POA: Diagnosis not present

## 2016-07-20 LAB — BASIC METABOLIC PANEL
BUN: 21 mg/dL (ref 7–25)
CO2: 26 mmol/L (ref 20–31)
Calcium: 10 mg/dL (ref 8.6–10.3)
Chloride: 101 mmol/L (ref 98–110)
Creat: 1.21 mg/dL — ABNORMAL HIGH (ref 0.70–1.18)
Glucose, Bld: 132 mg/dL — ABNORMAL HIGH (ref 65–99)
POTASSIUM: 4.7 mmol/L (ref 3.5–5.3)
SODIUM: 140 mmol/L (ref 135–146)

## 2016-07-20 LAB — LIPID PANEL
CHOL/HDL RATIO: 4.5 ratio (ref <=5.0)
CHOLESTEROL: 154 mg/dL (ref 125–200)
HDL: 34 mg/dL — AB (ref >=40)
LDL Cholesterol: 73 mg/dL (ref <130)
TRIGLYCERIDES: 235 mg/dL — AB (ref <150)
VLDL: 47 mg/dL — AB (ref <30)

## 2016-07-21 LAB — HEPATIC FUNCTION PANEL
ALT: 18 U/L (ref 9–46)
AST: 23 U/L (ref 10–35)
Albumin: 4.3 g/dL (ref 3.6–5.1)
Alkaline Phosphatase: 37 U/L — ABNORMAL LOW (ref 40–115)
Bilirubin, Direct: 0.1 mg/dL (ref <=0.2)
Indirect Bilirubin: 0.4 mg/dL (ref 0.2–1.2)
TOTAL PROTEIN: 6.7 g/dL (ref 6.1–8.1)
Total Bilirubin: 0.5 mg/dL (ref 0.2–1.2)

## 2016-07-26 NOTE — Progress Notes (Signed)
Office Visit    Patient Name: Paul [name] [facility] Date of Encounter: 07/27/2016  Primary Care Provider:  [name], MD Primary Cardiologist:  P. Martinique, MD   Chief Complaint    70 year old male with a history of CAD and CABG who is seen for follow up   Past Medical History    Past Medical History:  Diagnosis Date  . Allergy   . Coronary artery disease    a. s/p CABG;  b. 08/2015 MV: inf/inflat infarct w/ new peri-infarct isch; c. LHC 11/16: pLAD 70, dLAD 50, D1 100, oRI 100, mLCx 60, OM2 99, pRCA 99 (L>R collats), S-RPDA 100, S-D1 ok, S-RI/OM2 mid 85 (PCI: Synergy DES), L-LAD ok, EF 55-65%.  . Depression   . GERD (gastroesophageal reflux disease)   . Hyperlipidemia   . Hypertensive heart disease   . Hypothyroidism   . Insomnia   . OSA (obstructive sleep apnea)    CPAP  . Prostate cancer ([employer])   . Type II diabetes mellitus ([place])    Past Surgical History:  Procedure Laterality Date  . CARDIAC CATHETERIZATION N/A 09/19/2015   Procedure: Coronary Stent Intervention;  Surgeon: Peter M Martinique, MD;  Location: [place] [facility];  Service: Cardiovascular;  Laterality: N/A;  . CARDIAC CATHETERIZATION  09/19/2015   Procedure: Left Heart Cath and Cors/Grafts Angiography;  Surgeon: Peter M Martinique, MD;  Location: [place] [facility];  Service: Cardiovascular;;  . CHOLECYSTECTOMY    . CORONARY ARTERY BYPASS GRAFT    . JOINT REPLACEMENT     right knee  . PROSTATE SURGERY      Allergies  Allergies  Allergen Reactions  . Brilinta [Ticagrelor] Shortness Of Breath  . Yellow [place]et Venom Anaphylaxis  . Alprazolam     No good  . Penicillins Other (See Comments)    "Eyes swell shut"  . Percocet [Oxycodone-Acetaminophen]     Nightmares/hallucinations  . Septra Ds [Sulfamethoxazole-Trimethoprim]     rash    History of Present Illness    70 year old male with the above past medical history including CAD status post coronary artery bypass grafting, hypertension,  hyperlipidemia, diabetes, hypothyroidism, and obstructive sleep apnea. He was previously a patient of Dr. Mare Ferrari and notes that for a long period of time, he had intermittent shortness of breath and vague fatigue. In November 2016, he also reported chest tightness underwent stress testing which showed a new area of peri-infarct ischemia. This resulted in diagnostic catheterization being performed and revealing severe disease in the vein graft to the OM 2. The vein graft to the PDA was occluded and the distal right coronary artery filled via left-to-right collaterals. The vein graft to the OM 2 was felt to be the culprit and was successfully stented. Following the procedure, patient initially had some shortness of breath which ultimately was secondary to Brilinta and once he was switched to Plavix, he felt significantly better. He notes a noticeable improvement in his fatigue.  On his last visit he was noted to have symptoms of dizziness felt to be related to his BP meds. His losartan was reduced. He states his symptoms continued until he stopped taking Jardiance and then his symptoms resolved. He does not want to go back on this med. He is followed by Dr. Buddy Duty. He still has some fatigue and notes he is not sleeping well. He has a long history of OSA. He denies any chest pain. Occ. Loss of breath. Notes he has not been exercising regularly.  Home Medications  Prior to Admission medications   Medication Sig Start Date End Date Taking? Authorizing Provider  amLODipine (NORVASC) 5 MG tablet TAKE ONE TABLET BY MOUTH ONCE DAILY 05/13/15  Yes Darlin Coco, MD  aspirin 81 MG tablet Take 81 mg by mouth daily.   Yes Historical Provider, MD  clindamycin (CLEOCIN) 150 MG capsule Take 600 mg by mouth once as needed (1 hour prior to dental procedures). Reported on 02/22/2016 09/30/15  Yes Historical Provider, MD  clindamycin (CLEOCIN) 300 MG capsule Take 600 mg by mouth daily as needed (1 hour prior to dental  procedure). Reported on 02/22/2016 06/14/13  Yes Historical Provider, MD  clopidogrel (PLAVIX) 75 MG tablet Take 75 mg by mouth daily.   Yes Historical Provider, MD  colestipol (COLESTID) 1 G tablet Take 1 g by mouth daily.   Yes Historical Provider, MD  DULoxetine (CYMBALTA) 60 MG capsule Take 60 mg by mouth daily. 07/11/15  Yes Historical Provider, MD  empagliflozin (JARDIANCE) 10 MG TABS tablet Take 10 mg by mouth daily.   Yes Historical Provider, MD  EPINEPHrine (EPIPEN) 0.3 mg/0.3 mL DEVI Inject 0.3 mLs (0.3 mg total) into the muscle once. 05/22/12  Yes Darlin Coco, MD  fluticasone (FLONASE) 50 MCG/ACT nasal spray Place 2 sprays into the nose daily. 05/22/12  Yes Darlin Coco, MD  GuaiFENesin (MUCINEX PO) Take 600 mg by mouth daily as needed (cough and congestion). As needed   Yes Historical Provider, MD  levothyroxine (SYNTHROID, LEVOTHROID) 75 MCG tablet Take 75 mcg by mouth daily. 07/20/15  Yes Historical Provider, MD  liothyronine (CYTOMEL) 5 MCG tablet Take 5 mcg by mouth daily. 07/20/15  Yes Historical Provider, MD  losartan (COZAAR) 50 MG tablet Take 1 tablet (50 mg total) by mouth daily. 03/08/16  Yes Peter M Martinique, MD  metFORMIN (GLUCOPHAGE) 1000 MG tablet Take 1,000 mg by mouth 2 (two) times daily. 02/13/15  Yes Historical Provider, MD  metoprolol succinate (TOPROL-XL) 50 MG 24 hr tablet Take 1 tablet (50 mg total) by mouth daily. 03/19/16  Yes Peter M Martinique, MD  Multiple Vitamin (MULTIVITAMIN) tablet Take 1 tablet by mouth daily. Reported on 11/10/2015   Yes Historical Provider, MD  nitroGLYCERIN (NITROSTAT) 0.4 MG SL tablet Place 1 tablet (0.4 mg total) under the tongue every 5 (five) minutes as needed for chest pain. 09/26/15 02/14/18 Yes Darlin Coco, MD  ONETOUCH DELICA LANCETS [id number] MISC  07/20/15  Yes Historical Provider, MD  [facility] VERIO test strip 1 each by Other route as needed for other (CHECK BLOOD SUGAR).  08/23/12  Yes Historical Provider, MD  pantoprazole (PROTONIX)  20 MG tablet Take 1 tablet (20 mg total) by mouth daily. 11/14/15  Yes Darlin Coco, MD  potassium citrate (UROCIT-K) 10 MEQ (1080 MG) SR tablet Take 2 tablets by mouth 2 (two) times daily. 02/16/15  Yes Historical Provider, MD  simvastatin (ZOCOR) 20 MG tablet Take 1 tablet (20 mg total) by mouth at bedtime. 03/12/16  Yes Peter M Martinique, MD  vitamin B-12 (CYANOCOBALAMIN) 250 MCG tablet Take 250 mcg by mouth daily.   Yes Historical Provider, MD    Review of Systems    As noted in HPI. All other systems reviewed and are otherwise negative except as noted above.  Physical Exam    VS:  BP 130/80   Pulse 83   Ht 5\' 9"  (1.753 m)   Wt 209 lb (94.8 kg)   BMI 30.86 kg/m  , BMI Body mass index is 30.86 kg/m.  GEN: Well nourished, well developed, in no acute distress.  HEENT: normal.  Neck: Supple, no JVD, carotid bruits, or masses. Cardiac: RRR, no murmurs, rubs, or gallops. No clubbing, cyanosis, edema.  Radials/DP/PT 2+ and equal bilaterally.  Respiratory:  Respirations regular and unlabored, clear to auscultation bilaterally. GI: Soft, nontender, nondistended, BS + x 4. MS: no deformity or atrophy. Skin: warm and dry, no rash. Neuro:  Strength and sensation are intact. Psych: Normal affect.  Accessory Clinical Findings    Lab Results  Component Value Date   WBC 5.6 12/07/2015   HGB 12.6 (L) 12/07/2015   HCT 38.0 (L) 12/07/2015   PLT 198 12/07/2015   GLUCOSE 132 (H) 07/20/2016   CHOL 154 07/20/2016   TRIG 235 (H) 07/20/2016   HDL 34 (L) 07/20/2016   LDLDIRECT 76.0 02/28/2015   LDLCALC 73 07/20/2016   ALT 18 07/20/2016   AST 23 07/20/2016   NA 140 07/20/2016   K 4.7 07/20/2016   CL 101 07/20/2016   CREATININE 1.21 (H) 07/20/2016   BUN 21 07/20/2016   CO2 26 07/20/2016   INR 0.99 09/15/2015   HGBA1C 7.3 (H) 02/28/2015     Assessment & Plan    1.  Lightheadedness and fatigue: this appears to be related to [place]. Will discuss with Dr. Buddy Duty alternative therapy  for his DM.   2. Coronary artery disease: Status post prior CABG with PCI and gurgling stent placement to the vein graft to the second obtuse marginal in August. Catheterization at that time also revealed a patent LIMA to the LAD and vein graft to the diagonal. The vein graft to the PDA was occluded as was the native right coronary artery, though the distal right coronary was fed via left-to-right collaterals. Overall, he continues to feel better today than he felt prior to his stent placement. I have recommended he increase his aerobic activity.  He remains on aspirin, Plavix, statin, and beta blocker therapy. He may stop Plavix after Nov 21.   3. Hypertensive heart disease: currently well controlled. d  4. Hyperlipidemia: Continue statin therapy. Well controlled.  5. Disposition: Patient will follow up with me in 6 months.  Peter Martinique, MD,FACC 07/27/2016, 5:28 PM

## 2016-07-27 ENCOUNTER — Ambulatory Visit (INDEPENDENT_AMBULATORY_CARE_PROVIDER_SITE_OTHER): Payer: PPO | Admitting: Cardiology

## 2016-07-27 ENCOUNTER — Encounter: Payer: Self-pay | Admitting: Cardiology

## 2016-07-27 VITALS — BP 130/80 | HR 83 | Ht 69.0 in | Wt 209.0 lb

## 2016-07-27 DIAGNOSIS — E785 Hyperlipidemia, unspecified: Secondary | ICD-10-CM

## 2016-07-27 DIAGNOSIS — I25810 Atherosclerosis of coronary artery bypass graft(s) without angina pectoris: Secondary | ICD-10-CM | POA: Diagnosis not present

## 2016-07-27 DIAGNOSIS — I119 Hypertensive heart disease without heart failure: Secondary | ICD-10-CM

## 2016-07-27 MED ORDER — SIMVASTATIN 20 MG PO TABS: 20.0000 mg | ORAL_TABLET | Freq: Every day | ORAL | 3 refills | Status: DC

## 2016-07-27 MED ORDER — NITROGLYCERIN 0.4 MG SL SUBL: 0.4000 mg | SUBLINGUAL_TABLET | SUBLINGUAL | 11 refills | Status: DC | PRN

## 2016-07-27 NOTE — Patient Instructions (Signed)
You may stop Plavix on November 11.  Follow up with Dr. Buddy Duty concerning your sugar but otherwise continue current medication  I will see you in 6 months.

## 2016-08-08 DIAGNOSIS — C61 Malignant neoplasm of prostate: Secondary | ICD-10-CM | POA: Diagnosis not present

## 2016-08-15 DIAGNOSIS — Z87442 Personal history of urinary calculi: Secondary | ICD-10-CM | POA: Diagnosis not present

## 2016-08-15 DIAGNOSIS — N5201 Erectile dysfunction due to arterial insufficiency: Secondary | ICD-10-CM | POA: Diagnosis not present

## 2016-08-15 DIAGNOSIS — Z8546 Personal history of malignant neoplasm of prostate: Secondary | ICD-10-CM | POA: Diagnosis not present

## 2016-08-27 DIAGNOSIS — E785 Hyperlipidemia, unspecified: Secondary | ICD-10-CM | POA: Diagnosis not present

## 2016-08-27 DIAGNOSIS — I10 Essential (primary) hypertension: Secondary | ICD-10-CM | POA: Diagnosis not present

## 2016-08-27 DIAGNOSIS — E039 Hypothyroidism, unspecified: Secondary | ICD-10-CM | POA: Diagnosis not present

## 2016-08-27 DIAGNOSIS — G4733 Obstructive sleep apnea (adult) (pediatric): Secondary | ICD-10-CM | POA: Diagnosis not present

## 2016-08-27 DIAGNOSIS — Z1159 Encounter for screening for other viral diseases: Secondary | ICD-10-CM | POA: Diagnosis not present

## 2016-08-27 DIAGNOSIS — Z23 Encounter for immunization: Secondary | ICD-10-CM | POA: Diagnosis not present

## 2016-08-27 DIAGNOSIS — E1122 Type 2 diabetes mellitus with diabetic chronic kidney disease: Secondary | ICD-10-CM | POA: Diagnosis not present

## 2016-08-27 DIAGNOSIS — I252 Old myocardial infarction: Secondary | ICD-10-CM | POA: Diagnosis not present

## 2016-08-27 DIAGNOSIS — Z0000 Encounter for general adult medical examination without abnormal findings: Secondary | ICD-10-CM | POA: Diagnosis not present

## 2016-08-27 DIAGNOSIS — N183 Chronic kidney disease, stage 3 (moderate): Secondary | ICD-10-CM | POA: Diagnosis not present

## 2016-08-27 DIAGNOSIS — Z8546 Personal history of malignant neoplasm of prostate: Secondary | ICD-10-CM | POA: Diagnosis not present

## 2016-08-27 DIAGNOSIS — E669 Obesity, unspecified: Secondary | ICD-10-CM | POA: Diagnosis not present

## 2016-08-29 DIAGNOSIS — N5201 Erectile dysfunction due to arterial insufficiency: Secondary | ICD-10-CM | POA: Diagnosis not present

## 2016-11-13 ENCOUNTER — Telehealth: Payer: Self-pay | Admitting: Cardiology

## 2016-11-13 DIAGNOSIS — E785 Hyperlipidemia, unspecified: Secondary | ICD-10-CM

## 2016-11-13 DIAGNOSIS — E1169 Type 2 diabetes mellitus with other specified complication: Secondary | ICD-10-CM

## 2016-11-13 DIAGNOSIS — I119 Hypertensive heart disease without heart failure: Secondary | ICD-10-CM

## 2016-11-13 NOTE — Telephone Encounter (Signed)
Unsure if pt needs labs. Seen in Sept and had annual labwork then. He's coming for 6 mo f/u in March. Routed to [place] to advise.

## 2016-11-13 NOTE — Telephone Encounter (Signed)
New Message  Pt voiced he's suppose to have labs but there's no order in the system.  Please f/u with pt

## 2016-11-16 ENCOUNTER — Other Ambulatory Visit: Payer: Self-pay | Admitting: *Deleted

## 2016-11-16 MED ORDER — PANTOPRAZOLE SODIUM 20 MG PO TBEC: 20.0000 mg | DELAYED_RELEASE_TABLET | Freq: Every day | ORAL | 0 refills | Status: AC

## 2016-11-16 NOTE — Telephone Encounter (Signed)
Returned call to patient no answer.Left message on personal voice mail ok to have fasting lab 1 week before appointment with Dr.Jordan.Lab orders mailed.

## 2016-12-27 DIAGNOSIS — E785 Hyperlipidemia, unspecified: Secondary | ICD-10-CM | POA: Diagnosis not present

## 2016-12-27 DIAGNOSIS — E1169 Type 2 diabetes mellitus with other specified complication: Secondary | ICD-10-CM | POA: Diagnosis not present

## 2016-12-27 DIAGNOSIS — I119 Hypertensive heart disease without heart failure: Secondary | ICD-10-CM | POA: Diagnosis not present

## 2016-12-29 NOTE — Progress Notes (Signed)
Office Visit    Patient Name: Paul [name] [facility] Date of Encounter: 12/31/2016  Primary Care Provider:  [name], MD Primary Cardiologist:  P. Martinique, MD   Chief Complaint    71 year old male with a history of CAD and CABG who is seen for follow up   Past Medical History    Past Medical History:  Diagnosis Date  . Allergy   . Coronary artery disease    a. s/p CABG;  b. 08/2015 MV: inf/inflat infarct w/ new peri-infarct isch; c. LHC 11/16: pLAD 70, dLAD 50, D1 100, oRI 100, mLCx 60, OM2 99, pRCA 99 (L>R collats), S-RPDA 100, S-D1 ok, S-RI/OM2 mid 85 (PCI: Synergy DES), L-LAD ok, EF 55-65%.  . Depression   . GERD (gastroesophageal reflux disease)   . Hyperlipidemia   . Hypertensive heart disease   . Hypothyroidism   . Insomnia   . OSA (obstructive sleep apnea)    CPAP  . Prostate cancer ([place])   . Type II diabetes mellitus ([place])    Past Surgical History:  Procedure Laterality Date  . CARDIAC CATHETERIZATION N/A 09/19/2015   Procedure: Coronary Stent Intervention;  Surgeon: Zahniya Zellars M Martinique, MD;  Location: [place] [facility];  Service: Cardiovascular;  Laterality: N/A;  . CARDIAC CATHETERIZATION  09/19/2015   Procedure: Left Heart Cath and Cors/Grafts Angiography;  Surgeon: Beretta Ginsberg M Martinique, MD;  Location: [place] [facility];  Service: Cardiovascular;;  . CHOLECYSTECTOMY    . CORONARY ARTERY BYPASS GRAFT    . JOINT REPLACEMENT     right knee  . PROSTATE SURGERY      Allergies  Allergies  Allergen Reactions  . Brilinta [Ticagrelor] Shortness Of Breath  . Yellow Jacket Venom Anaphylaxis  . Alprazolam     No good  . Penicillins Other (See Comments)    "Eyes swell shut"  . Percocet [Oxycodone-Acetaminophen]     Nightmares/hallucinations  . Septra Ds [Sulfamethoxazole-Trimethoprim]     rash    History of Present Illness    71 year old male with the above past medical history including CAD status post coronary artery bypass grafting, hypertension,  hyperlipidemia, diabetes, hypothyroidism, and obstructive sleep apnea.  In November 2016, he also reported chest tightness underwent stress testing which showed a new area of peri-infarct ischemia. This resulted in diagnostic catheterization being performed and revealing severe disease in the vein graft to the OM 2. The vein graft to the PDA was occluded and the distal right coronary artery filled via left-to-right collaterals. The vein graft to the OM 2 was felt to be the culprit and was successfully stented. Following the procedure, patient initially had some shortness of breath which ultimately was secondary to Brilinta and once he was switched to Plavix, he felt significantly better. He notes a noticeable improvement in his fatigue.  On his last visit he was noted to have symptoms of dizziness felt to be related to his BP meds. His losartan was reduced. He states his symptoms continued until he stopped taking Jardiance and then his symptoms resolved. He is now on metformin and glimiperide.  He still complains of  fatigue and notes he is not sleeping well. Uses CPAP but only sleeps 2-3 hours at a time. Naps frequently during the day. He has sporadic localized pain in the left breast. States he has had this for years.  Notes he has not been exercising regularly. Weight is up 10 lbs since last May.  Home Medications    Prior to Admission medications  Medication Sig Start Date End Date Taking? Authorizing Provider  amLODipine (NORVASC) 5 MG tablet TAKE ONE TABLET BY MOUTH ONCE DAILY 05/13/15  Yes Darlin Coco, MD  aspirin 81 MG tablet Take 81 mg by mouth daily.   Yes Historical Provider, MD  clindamycin (CLEOCIN) 150 MG capsule Take 600 mg by mouth once as needed (1 hour prior to dental procedures). Reported on 02/22/2016 09/30/15  Yes Historical Provider, MD  clindamycin (CLEOCIN) 300 MG capsule Take 600 mg by mouth daily as needed (1 hour prior to dental procedure). Reported on 02/22/2016 06/14/13  Yes  Historical Provider, MD  clopidogrel (PLAVIX) 75 MG tablet Take 75 mg by mouth daily.   Yes Historical Provider, MD  colestipol (COLESTID) 1 G tablet Take 1 g by mouth daily.   Yes Historical Provider, MD  DULoxetine (CYMBALTA) 60 MG capsule Take 60 mg by mouth daily. 07/11/15  Yes Historical Provider, MD  empagliflozin (JARDIANCE) 10 MG TABS tablet Take 10 mg by mouth daily.   Yes Historical Provider, MD  EPINEPHrine (EPIPEN) 0.3 mg/0.3 mL DEVI Inject 0.3 mLs (0.3 mg total) into the muscle once. 05/22/12  Yes Darlin Coco, MD  fluticasone (FLONASE) 50 MCG/ACT nasal spray Place 2 sprays into the nose daily. 05/22/12  Yes Darlin Coco, MD  GuaiFENesin (MUCINEX PO) Take 600 mg by mouth daily as needed (cough and congestion). As needed   Yes Historical Provider, MD  levothyroxine (SYNTHROID, LEVOTHROID) 75 MCG tablet Take 75 mcg by mouth daily. 07/20/15  Yes Historical Provider, MD  liothyronine (CYTOMEL) 5 MCG tablet Take 5 mcg by mouth daily. 07/20/15  Yes Historical Provider, MD  losartan (COZAAR) 50 MG tablet Take 1 tablet (50 mg total) by mouth daily. 03/08/16  Yes Tarnesha Ulloa M Martinique, MD  metFORMIN (GLUCOPHAGE) 1000 MG tablet Take 1,000 mg by mouth 2 (two) times daily. 02/13/15  Yes Historical Provider, MD  metoprolol succinate (TOPROL-XL) 50 MG 24 hr tablet Take 1 tablet (50 mg total) by mouth daily. 03/19/16  Yes Jadalynn Burr M Martinique, MD  Multiple Vitamin (MULTIVITAMIN) tablet Take 1 tablet by mouth daily. Reported on 11/10/2015   Yes Historical Provider, MD  nitroGLYCERIN (NITROSTAT) 0.4 MG SL tablet Place 1 tablet (0.4 mg total) under the tongue every 5 (five) minutes as needed for chest pain. 09/26/15 02/14/18 Yes Darlin Coco, MD  ONETOUCH DELICA LANCETS [id number] MISC  07/20/15  Yes Historical Provider, MD  [facility] VERIO test strip 1 each by Other route as needed for other (CHECK BLOOD SUGAR).  08/23/12  Yes Historical Provider, MD  pantoprazole (PROTONIX) 20 MG tablet Take 1 tablet (20 mg total) by  mouth daily. 11/14/15  Yes Darlin Coco, MD  potassium citrate (UROCIT-K) 10 MEQ (1080 MG) SR tablet Take 2 tablets by mouth 2 (two) times daily. 02/16/15  Yes Historical Provider, MD  simvastatin (ZOCOR) 20 MG tablet Take 1 tablet (20 mg total) by mouth at bedtime. 03/12/16  Yes Reegan Bouffard M Martinique, MD  vitamin B-12 (CYANOCOBALAMIN) 250 MCG tablet Take 250 mcg by mouth daily.   Yes Historical Provider, MD    Review of Systems    As noted in HPI. All other systems reviewed and are otherwise negative except as noted above.  Physical Exam    VS:  BP (!) 142/92 (BP Location: Right Arm, Cuff Size: Normal)   Pulse 94   Ht 5\' 9"  (1.753 m)   Wt 215 lb (97.5 kg)   BMI 31.75 kg/m  , BMI Body mass index is 31.75 kg/m.  GEN: Well nourished, well developed, in no acute distress.  HEENT: normal.  Neck: Supple, no JVD, carotid bruits, or masses. Cardiac: RRR, no murmurs, rubs, or gallops. No clubbing, cyanosis, edema.  Radials/DP/PT 2+ and equal bilaterally.  Respiratory:  Respirations regular and unlabored, clear to auscultation bilaterally. GI: Soft, nontender, nondistended, BS + x 4. MS: no deformity or atrophy. Skin: warm and dry, no rash. Neuro:  Strength and sensation are intact. Psych: Normal affect.  Accessory Clinical Findings    Lab Results  Component Value Date   WBC 5.6 12/07/2015   HGB 12.6 (L) 12/07/2015   HCT 38.0 (L) 12/07/2015   PLT 198 12/07/2015   GLUCOSE 132 (H) 07/20/2016   CHOL 154 07/20/2016   TRIG 235 (H) 07/20/2016   HDL 34 (L) 07/20/2016   LDLDIRECT 76.0 02/28/2015   LDLCALC 73 07/20/2016   ALT 18 07/20/2016   AST 23 07/20/2016   NA 140 07/20/2016   K 4.7 07/20/2016   CL 101 07/20/2016   CREATININE 1.21 (H) 07/20/2016   BUN 21 07/20/2016   CO2 26 07/20/2016   INR 0.99 09/15/2015   HGBA1C 7.3 (H) 02/28/2015   Labs dated 07/20/16: cholesterol 154, triglycerides 235, HDL 34, LDL 73.  Dated 08/27/16: CMET normal  Assessment & Plan    1.  Fatigue. I  suspect this is related to sleep apnea with poor sleep architecture at night. No clear relation to medications.   2. Coronary artery disease: Status post prior CABG with PCI and DES placement to the vein graft to the second obtuse marginal in August. Catheterization at that time also revealed a patent LIMA to the LAD and vein graft to the diagonal. The vein graft to the PDA was occluded as was the native right coronary artery, though the distal right coronary was fed via left-to-right collaterals. Overall, he continues to feel better today than he felt prior to his stent placement. no clear anginal symptoms. I have recommended he increase his aerobic activity and lose weight.  He remains on aspirin,  statin, and beta blocker therapy.   3. Hypertensive heart disease: some BP elevation today. Monitor at home. Generally BP has been well controlled.   4. Hyperlipidemia: Continue statin therapy. Well controlled.  5. Disposition: Patient will follow up with me in 6 months.  Jayon Matton Martinique, MD,FACC 12/31/2016, 4:07 PM

## 2016-12-31 ENCOUNTER — Encounter: Payer: Self-pay | Admitting: Cardiology

## 2016-12-31 ENCOUNTER — Ambulatory Visit (INDEPENDENT_AMBULATORY_CARE_PROVIDER_SITE_OTHER): Payer: PPO | Admitting: Cardiology

## 2016-12-31 VITALS — BP 142/92 | HR 94 | Ht 69.0 in | Wt 215.0 lb

## 2016-12-31 DIAGNOSIS — I119 Hypertensive heart disease without heart failure: Secondary | ICD-10-CM | POA: Diagnosis not present

## 2016-12-31 DIAGNOSIS — R5383 Other fatigue: Secondary | ICD-10-CM | POA: Diagnosis not present

## 2016-12-31 DIAGNOSIS — I25810 Atherosclerosis of coronary artery bypass graft(s) without angina pectoris: Secondary | ICD-10-CM | POA: Diagnosis not present

## 2016-12-31 DIAGNOSIS — Z951 Presence of aortocoronary bypass graft: Secondary | ICD-10-CM | POA: Diagnosis not present

## 2016-12-31 DIAGNOSIS — E039 Hypothyroidism, unspecified: Secondary | ICD-10-CM | POA: Diagnosis not present

## 2016-12-31 DIAGNOSIS — N183 Chronic kidney disease, stage 3 (moderate): Secondary | ICD-10-CM | POA: Diagnosis not present

## 2016-12-31 DIAGNOSIS — E785 Hyperlipidemia, unspecified: Secondary | ICD-10-CM | POA: Diagnosis not present

## 2016-12-31 DIAGNOSIS — E782 Mixed hyperlipidemia: Secondary | ICD-10-CM

## 2016-12-31 DIAGNOSIS — I252 Old myocardial infarction: Secondary | ICD-10-CM | POA: Diagnosis not present

## 2016-12-31 DIAGNOSIS — Z0000 Encounter for general adult medical examination without abnormal findings: Secondary | ICD-10-CM | POA: Diagnosis not present

## 2016-12-31 DIAGNOSIS — I10 Essential (primary) hypertension: Secondary | ICD-10-CM | POA: Diagnosis not present

## 2016-12-31 DIAGNOSIS — K219 Gastro-esophageal reflux disease without esophagitis: Secondary | ICD-10-CM | POA: Diagnosis not present

## 2016-12-31 DIAGNOSIS — Z79899 Other long term (current) drug therapy: Secondary | ICD-10-CM | POA: Diagnosis not present

## 2016-12-31 DIAGNOSIS — E1122 Type 2 diabetes mellitus with diabetic chronic kidney disease: Secondary | ICD-10-CM | POA: Diagnosis not present

## 2016-12-31 DIAGNOSIS — R801 Persistent proteinuria, unspecified: Secondary | ICD-10-CM | POA: Diagnosis not present

## 2016-12-31 DIAGNOSIS — E1165 Type 2 diabetes mellitus with hyperglycemia: Secondary | ICD-10-CM | POA: Diagnosis not present

## 2016-12-31 DIAGNOSIS — F325 Major depressive disorder, single episode, in full remission: Secondary | ICD-10-CM | POA: Diagnosis not present

## 2016-12-31 MED ORDER — METOPROLOL SUCCINATE ER 50 MG PO TB24: 50.0000 mg | ORAL_TABLET | Freq: Every day | ORAL | 3 refills | Status: DC

## 2016-12-31 MED ORDER — NITROGLYCERIN 0.4 MG SL SUBL: 0.4000 mg | SUBLINGUAL_TABLET | SUBLINGUAL | 11 refills | Status: DC | PRN

## 2016-12-31 MED ORDER — LOSARTAN POTASSIUM 50 MG PO TABS: ORAL_TABLET | ORAL | 3 refills | Status: DC

## 2016-12-31 NOTE — Patient Instructions (Signed)
Increase aerobic exercise and lose weight  Continue your current therapy  I will see you in 6 months.

## 2017-01-16 DIAGNOSIS — I10 Essential (primary) hypertension: Secondary | ICD-10-CM | POA: Diagnosis not present

## 2017-01-16 DIAGNOSIS — Z8546 Personal history of malignant neoplasm of prostate: Secondary | ICD-10-CM | POA: Diagnosis not present

## 2017-01-16 DIAGNOSIS — F325 Major depressive disorder, single episode, in full remission: Secondary | ICD-10-CM | POA: Diagnosis not present

## 2017-01-16 DIAGNOSIS — K219 Gastro-esophageal reflux disease without esophagitis: Secondary | ICD-10-CM | POA: Diagnosis not present

## 2017-01-16 DIAGNOSIS — E039 Hypothyroidism, unspecified: Secondary | ICD-10-CM | POA: Diagnosis not present

## 2017-01-16 DIAGNOSIS — E1122 Type 2 diabetes mellitus with diabetic chronic kidney disease: Secondary | ICD-10-CM | POA: Diagnosis not present

## 2017-01-16 DIAGNOSIS — I2510 Atherosclerotic heart disease of native coronary artery without angina pectoris: Secondary | ICD-10-CM | POA: Diagnosis not present

## 2017-01-16 DIAGNOSIS — E785 Hyperlipidemia, unspecified: Secondary | ICD-10-CM | POA: Diagnosis not present

## 2017-01-16 DIAGNOSIS — G4733 Obstructive sleep apnea (adult) (pediatric): Secondary | ICD-10-CM | POA: Diagnosis not present

## 2017-01-16 DIAGNOSIS — K529 Noninfective gastroenteritis and colitis, unspecified: Secondary | ICD-10-CM | POA: Diagnosis not present

## 2017-01-16 DIAGNOSIS — Z7984 Long term (current) use of oral hypoglycemic drugs: Secondary | ICD-10-CM | POA: Diagnosis not present

## 2017-01-16 DIAGNOSIS — R801 Persistent proteinuria, unspecified: Secondary | ICD-10-CM | POA: Diagnosis not present

## 2017-01-23 DIAGNOSIS — E119 Type 2 diabetes mellitus without complications: Secondary | ICD-10-CM | POA: Diagnosis not present

## 2017-03-11 DIAGNOSIS — R5383 Other fatigue: Secondary | ICD-10-CM | POA: Diagnosis not present

## 2017-03-11 DIAGNOSIS — I10 Essential (primary) hypertension: Secondary | ICD-10-CM | POA: Diagnosis not present

## 2017-03-11 DIAGNOSIS — R809 Proteinuria, unspecified: Secondary | ICD-10-CM | POA: Diagnosis not present

## 2017-04-12 DIAGNOSIS — I10 Essential (primary) hypertension: Secondary | ICD-10-CM | POA: Diagnosis not present

## 2017-04-12 DIAGNOSIS — E669 Obesity, unspecified: Secondary | ICD-10-CM | POA: Diagnosis not present

## 2017-04-12 DIAGNOSIS — R5383 Other fatigue: Secondary | ICD-10-CM | POA: Diagnosis not present

## 2017-04-12 DIAGNOSIS — R809 Proteinuria, unspecified: Secondary | ICD-10-CM | POA: Diagnosis not present

## 2017-04-26 DIAGNOSIS — R809 Proteinuria, unspecified: Secondary | ICD-10-CM | POA: Diagnosis not present

## 2017-04-26 DIAGNOSIS — E039 Hypothyroidism, unspecified: Secondary | ICD-10-CM | POA: Diagnosis not present

## 2017-04-26 DIAGNOSIS — E1122 Type 2 diabetes mellitus with diabetic chronic kidney disease: Secondary | ICD-10-CM | POA: Diagnosis not present

## 2017-04-26 DIAGNOSIS — Z7984 Long term (current) use of oral hypoglycemic drugs: Secondary | ICD-10-CM | POA: Diagnosis not present

## 2017-04-26 DIAGNOSIS — E785 Hyperlipidemia, unspecified: Secondary | ICD-10-CM | POA: Diagnosis not present

## 2017-04-26 DIAGNOSIS — I10 Essential (primary) hypertension: Secondary | ICD-10-CM | POA: Diagnosis not present

## 2017-05-13 DIAGNOSIS — G4733 Obstructive sleep apnea (adult) (pediatric): Secondary | ICD-10-CM | POA: Diagnosis not present

## 2017-05-23 ENCOUNTER — Telehealth: Payer: Self-pay | Admitting: Cardiology

## 2017-05-23 NOTE — Telephone Encounter (Signed)
Returned call to patient Dr.Jordan's advice given. 

## 2017-05-23 NOTE — Telephone Encounter (Signed)
Patient calling, states that he would like to know if Dr. Martinique would like him to have blood work completed before next appointment.

## 2017-05-23 NOTE — Telephone Encounter (Signed)
He just had labs done in March so we are OK.  Peter Martinique MD, [facility]

## 2017-06-17 DIAGNOSIS — I2510 Atherosclerotic heart disease of native coronary artery without angina pectoris: Secondary | ICD-10-CM | POA: Diagnosis not present

## 2017-06-17 DIAGNOSIS — I10 Essential (primary) hypertension: Secondary | ICD-10-CM | POA: Diagnosis not present

## 2017-06-18 ENCOUNTER — Telehealth: Payer: Self-pay | Admitting: Cardiology

## 2017-06-18 NOTE — Telephone Encounter (Signed)
New message    Dr. Leonides Schanz is calling to speak about this patient.

## 2017-06-18 NOTE — Progress Notes (Addendum)
  Office Visit    Patient Name: Paul [name] Date of Encounter: 06/19/2017  Primary Care Provider:  McNeill, Wendy, MD Primary Cardiologist:  P. Miel Wisener, MD   Chief Complaint    71-year-old male with a history of CAD and CABG who is seen at the request of Dr. McNeill for evaluation of uncontrolled HTN and exertional fatigue.   Past Medical History    Past Medical History:  Diagnosis Date  . Allergy   . Coronary artery disease    a. s/p CABG;  b. 08/2015 MV: inf/inflat infarct w/ new peri-infarct isch; c. LHC 11/16: pLAD 70, dLAD 50, D1 100, oRI 100, mLCx 60, OM2 99, pRCA 99 (L>R collats), S-RPDA 100, S-D1 ok, S-RI/OM2 mid 85 (PCI: Synergy DES), L-LAD ok, EF 55-65%.  . Depression   . GERD (gastroesophageal reflux disease)   . Hyperlipidemia   . Hypertensive heart disease   . Hypothyroidism   . Insomnia   . OSA (obstructive sleep apnea)    CPAP  . Prostate cancer ([facility])   . Type II diabetes mellitus ([facility])    Past Surgical History:  Procedure Laterality Date  . CARDIAC CATHETERIZATION N/A 09/19/2015   Procedure: Coronary Stent Intervention;  Surgeon: Giavonni Fonder M Anthonio Mizzell, MD;  Location: [facility];  Service: Cardiovascular;  Laterality: N/A;  . CARDIAC CATHETERIZATION  09/19/2015   Procedure: Left Heart Cath and Cors/Grafts Angiography;  Surgeon: Nadra Hritz M Tyler Robidoux, MD;  Location: [facility];  Service: Cardiovascular;;  . CHOLECYSTECTOMY    . CORONARY ARTERY BYPASS GRAFT    . JOINT REPLACEMENT     right knee  . PROSTATE SURGERY      Allergies  Allergies  Allergen Reactions  . Brilinta [Ticagrelor] Shortness Of Breath  . Yellow Jacket Venom Anaphylaxis  . Alprazolam     No good  . Penicillins Other (See Comments)    "Eyes swell shut"  . Percocet [Oxycodone-Acetaminophen]     Nightmares/hallucinations  . Septra Ds [Sulfamethoxazole-Trimethoprim]     rash    History of Present Illness    71-year-old male with the above past medical history including  CAD status post coronary artery bypass grafting in 2005, hypertension, hyperlipidemia, diabetes, hypothyroidism, and obstructive sleep apnea.  In November 2016, he also reported chest tightness and  underwent stress testing which showed inferolateral infarct with  peri-infarct ischemia. This resulted in diagnostic catheterization being performed and revealing severe disease in the vein graft to the OM 2. The vein graft to the PDA was occluded and the distal right coronary artery filled via left-to-right collaterals. The vein graft to the OM 2 was felt to be the culprit and was successfully stented. Following the procedure, patient initially had some shortness of breath which ultimately was secondary to Brilinta and once he was switched to Plavix, he felt significantly better.  On his last visit he was noted to have symptoms of dizziness felt to be related to his BP meds. His losartan was reduced. He states his symptoms continued until he stopped taking Jardiance and then his symptoms resolved. He is now on metformin and glimiperide.    More recently he states that Dr. McNeill found he was spilling protein in his urine. Seen by Dr. Mattingly who felt this was related to HTN and DM. BP was persistently high and Metoprolol and losartan doses were increased. He still complains of exertional fatigue. States he can work in his yard only about 10 minutes before he has to stop and   rest.  Uses CPAP but only sleeps 2-3 hours at a time. Naps frequently during the day. He has sporadic localized pain in the left breast. States he has had this for years.  He has been concerned about exercising because he is worried about his circulation. Initially after his stent in 2016 his fatigue improved but he is not sure now that it is better.   Home Medications    Allergies as of 06/19/2017      Reactions   Brilinta [ticagrelor] Shortness Of Breath   Yellow Jacket Venom Anaphylaxis   Alprazolam    No good   Penicillins Other  (See Comments)   "Eyes swell shut"   Percocet [oxycodone-acetaminophen]    Nightmares/hallucinations   Septra Ds [sulfamethoxazole-trimethoprim]    rash      Medication List       Accurate as of 06/19/17  5:30 PM. Always use your most recent med list.          amLODipine 5 MG tablet Commonly known as:  NORVASC Take 1 tablet (5 mg total) by mouth daily.   aspirin 81 MG tablet Take 81 mg by mouth daily.   clindamycin 150 MG capsule Commonly known as:  CLEOCIN Take 300 mg by mouth once as needed (1 hour prior to dental procedures). Reported on 02/22/2016   colestipol 1 g tablet Commonly known as:  COLESTID Take 1 g by mouth daily.   DULoxetine 60 MG capsule Commonly known as:  CYMBALTA Take 60 mg by mouth daily.   fluticasone 50 MCG/ACT nasal spray Commonly known as:  FLONASE Place 2 sprays into the nose daily.   glimepiride 4 MG tablet Commonly known as:  AMARYL   levothyroxine 75 MCG tablet Commonly known as:  SYNTHROID, LEVOTHROID Take 75 mcg by mouth daily.   liothyronine 5 MCG tablet Commonly known as:  CYTOMEL Take 5 mcg by mouth daily.   losartan 50 MG tablet Commonly known as:  COZAAR Take half a tablet (25mg) daily   metFORMIN 1000 MG tablet Commonly known as:  GLUCOPHAGE Take 1,000 mg by mouth 2 (two) times daily.   metoprolol succinate 50 MG 24 hr tablet Commonly known as:  TOPROL-XL Take 1 tablet (50 mg total) by mouth daily.   MUCINEX PO Take 600 mg by mouth daily as needed (cough and congestion). As needed   multivitamin tablet Take 1 tablet by mouth daily. Reported on 11/10/2015   nitroGLYCERIN 0.4 MG SL tablet Commonly known as:  NITROSTAT Place 1 tablet (0.4 mg total) under the tongue every 5 (five) minutes as needed for chest pain.   ONETOUCH DELICA LANCETS 33G Misc   ONETOUCH VERIO test strip Generic drug:  glucose blood 1 each by Other route as needed for other (CHECK BLOOD SUGAR).   pantoprazole 20 MG tablet Commonly known  as:  PROTONIX Take 1 tablet (20 mg total) by mouth daily.   potassium citrate 10 MEQ (1080 MG) SR tablet Commonly known as:  UROCIT-K Take 2 tablets by mouth 2 (two) times daily.   simvastatin 20 MG tablet Commonly known as:  ZOCOR Take 1 tablet (20 mg total) by mouth at bedtime.   vitamin B-12 250 MCG tablet Commonly known as:  CYANOCOBALAMIN Take 250 mcg by mouth daily.            Discharge Care Instructions        Start     Ordered   06/19/17 0000  EKG 12-Lead    Question:  Where   should this test be performed  Answer:  Other   06/19/17 1724       Review of Systems    As noted in HPI. All other systems reviewed and are otherwise negative except as noted above.  Physical Exam    VS:  BP 140/82   Pulse 84   Ht 5' 9" (1.753 m)   Wt 211 lb (95.7 kg)   BMI 31.16 kg/m  , BMI Body mass index is 31.16 kg/m. BP with his cuff was 131/90.   GEN: Well nourished, well developed, in no acute distress.  HEENT: normal.  Neck: Supple, no JVD, carotid bruits, or masses. Cardiac: RRR, no murmurs, rubs, or gallops. No clubbing, cyanosis, edema.  Radials/DP/PT 2+ and equal bilaterally.  Respiratory:  Respirations regular and unlabored, clear to auscultation bilaterally. GI: Soft, nontender, nondistended, BS + x 4. MS: no deformity or atrophy. Skin: warm and dry, no rash. Neuro:  Strength and sensation are intact. Psych: Normal affect.  Accessory Clinical Findings    Lab Results  Component Value Date   WBC 5.6 12/07/2015   HGB 12.6 (L) 12/07/2015   HCT 38.0 (L) 12/07/2015   PLT 198 12/07/2015   GLUCOSE 132 (H) 07/20/2016   CHOL 154 07/20/2016   TRIG 235 (H) 07/20/2016   HDL 34 (L) 07/20/2016   LDLDIRECT 76.0 02/28/2015   LDLCALC 73 07/20/2016   ALT 18 07/20/2016   AST 23 07/20/2016   NA 140 07/20/2016   K 4.7 07/20/2016   CL 101 07/20/2016   CREATININE 1.21 (H) 07/20/2016   BUN 21 07/20/2016   CO2 26 07/20/2016   INR 0.99 09/15/2015   HGBA1C 7.3 (H)  02/28/2015   Labs dated 07/20/16: cholesterol 154, triglycerides 235, HDL 34, LDL 73.  Dated 08/27/16: CMET normal  Ecg today shows NSR with nonspecific TWA. I have personally reviewed and interpreted this study.   Assessment & Plan    1.  Exertional Fatigue. Differential includes ischemia with anginal equivalent symptoms, poor sleep architecture with OSA, Hypertensive heart disease, or effect of BP medication. We discussed changing his BP medication and seeing how he does versus pursuing ischemic evaluation. I don't think stress testing will be very helpful since it will be abnormal with his RCA occlusion. He had prior stenting in 2016 of SVG and now his grafts are 71 years old. I think the best evaluation would be to proceed directly to cardiac cath. The patient also has significant anxiety related to this. He is agreeable to proceed. Will plan cardiac cath on August 29. The procedure and risks were reviewed including but not limited to death, myocardial infarction, stroke, arrythmias, bleeding, transfusion, emergency surgery, dye allergy, or renal dysfunction. The patient voices understanding and is agreeable to proceed..   2. Coronary artery disease: Status post prior CABG in 2005 with PCI and DES placement to the vein graft to the second obtuse marginal in August 2016. Catheterization at that time also revealed a patent LIMA to the LAD and vein graft to the diagonal. The vein graft to the PDA was occluded as was the native right coronary artery, though the distal right coronary was fed via left-to-right collaterals.   He remains on aspirin,  statin, and beta blocker therapy. See plans for cardiac cath as noted above.  3. Hypertensive heart disease: BP elevated more recently. Will await results of cardiac cath.  May consider switching metoprolol to either Bystolic or Coreg for vasodilatory effects which may help with BP control and   lessen fatigue.   4. Hyperlipidemia: Continue statin therapy. Well  controlled.  5. Proteinuria - followed by Ren[facility].   Jeana Kersting, MD,FACC 06/19/2017, 5:30 PM    

## 2017-06-18 NOTE — Telephone Encounter (Signed)
Received a call from Dr.McNeil.She stated patient's B/P has been elevated for the past 1 month.Stated he is not responding to treatment.Stated he is more fatigued.She would like patient to see Dr.Jordan. Spoke to patient appointment scheduled with Dr.Jordan 06/19/17 at 4:40 pm.

## 2017-06-19 ENCOUNTER — Ambulatory Visit (INDEPENDENT_AMBULATORY_CARE_PROVIDER_SITE_OTHER): Payer: PPO | Admitting: Cardiology

## 2017-06-19 ENCOUNTER — Encounter: Payer: Self-pay | Admitting: Cardiology

## 2017-06-19 ENCOUNTER — Other Ambulatory Visit: Payer: Self-pay

## 2017-06-19 VITALS — BP 140/82 | HR 84 | Ht 69.0 in | Wt 211.0 lb

## 2017-06-19 DIAGNOSIS — E782 Mixed hyperlipidemia: Secondary | ICD-10-CM

## 2017-06-19 DIAGNOSIS — Z951 Presence of aortocoronary bypass graft: Secondary | ICD-10-CM | POA: Diagnosis not present

## 2017-06-19 DIAGNOSIS — I25810 Atherosclerosis of coronary artery bypass graft(s) without angina pectoris: Secondary | ICD-10-CM

## 2017-06-19 DIAGNOSIS — I25708 Atherosclerosis of coronary artery bypass graft(s), unspecified, with other forms of angina pectoris: Secondary | ICD-10-CM

## 2017-06-19 DIAGNOSIS — N182 Chronic kidney disease, stage 2 (mild): Secondary | ICD-10-CM | POA: Diagnosis not present

## 2017-06-19 DIAGNOSIS — E1122 Type 2 diabetes mellitus with diabetic chronic kidney disease: Secondary | ICD-10-CM | POA: Diagnosis not present

## 2017-06-19 DIAGNOSIS — I119 Hypertensive heart disease without heart failure: Secondary | ICD-10-CM

## 2017-06-20 ENCOUNTER — Telehealth: Payer: Self-pay | Admitting: Cardiology

## 2017-06-20 NOTE — Telephone Encounter (Signed)
New message    Pt is calling to speak about an angiogram. Only wants to speak to [place]. He may need to reschedule

## 2017-06-21 ENCOUNTER — Ambulatory Visit
Admission: RE | Admit: 2017-06-21 | Discharge: 2017-06-21 | Disposition: A | Discharge status: Home / Self Care | Payer: PPO | Source: Ambulatory Visit | Attending: Cardiology | Admitting: Cardiology

## 2017-06-21 ENCOUNTER — Other Ambulatory Visit: Payer: Self-pay | Admitting: Cardiology

## 2017-06-21 DIAGNOSIS — I25810 Atherosclerosis of coronary artery bypass graft(s) without angina pectoris: Secondary | ICD-10-CM

## 2017-06-21 DIAGNOSIS — I209 Angina pectoris, unspecified: Secondary | ICD-10-CM

## 2017-06-21 DIAGNOSIS — Z01818 Encounter for other preprocedural examination: Secondary | ICD-10-CM | POA: Diagnosis not present

## 2017-06-21 LAB — BASIC METABOLIC PANEL
BUN/Creatinine Ratio: 19 (ref 10–24)
BUN: 24 mg/dL (ref 8–27)
CHLORIDE: 101 mmol/L (ref 96–106)
CO2: 22 mmol/L (ref 20–29)
Calcium: 10 mg/dL (ref 8.6–10.2)
Creatinine, Ser: 1.29 mg/dL — ABNORMAL HIGH (ref 0.76–1.27)
GFR calc Af Amer: 64 mL/min/{1.73_m2} (ref >59)
GFR calc non Af Amer: 55 mL/min/{1.73_m2} — ABNORMAL LOW (ref >59)
GLUCOSE: 93 mg/dL (ref 65–99)
POTASSIUM: 4.7 mmol/L (ref 3.5–5.2)
SODIUM: 141 mmol/L (ref 134–144)

## 2017-06-21 LAB — CBC WITH DIFFERENTIAL/PLATELET
BASOS ABS: 0 10*3/uL (ref 0.0–0.2)
Basos: 0 %
EOS (ABSOLUTE): 0.1 10*3/uL (ref 0.0–0.4)
Eos: 2 %
Hematocrit: 38.2 % (ref 37.5–51.0)
Hemoglobin: 12.7 g/dL — ABNORMAL LOW (ref 13.0–17.7)
IMMATURE GRANULOCYTES: 0 %
Immature Grans (Abs): 0 10*3/uL (ref 0.0–0.1)
Lymphocytes Absolute: 2.9 10*3/uL (ref 0.7–3.1)
Lymphs: 50 %
MCH: 27.9 pg (ref 26.6–33.0)
MCHC: 33.2 g/dL (ref 31.5–35.7)
MCV: 84 fL (ref 79–97)
MONOS ABS: 0.5 10*3/uL (ref 0.1–0.9)
Monocytes: 9 %
NEUTROS PCT: 39 %
Neutrophils Absolute: 2.3 10*3/uL (ref 1.4–7.0)
PLATELETS: 211 10*3/uL (ref 150–379)
RBC: 4.56 x10E6/uL (ref 4.14–5.80)
RDW: 15.2 % (ref 12.3–15.4)
WBC: 5.9 10*3/uL (ref 3.4–10.8)

## 2017-06-21 LAB — PROTIME-INR
INR: 1 (ref 0.8–1.2)
PROTHROMBIN TIME: 10.5 s (ref 9.1–12.0)

## 2017-06-21 NOTE — Telephone Encounter (Signed)
Returned call to patient.He stated he was going to change cath date,but everything has worked out.Stated he will be coming by office this morning to pick up instructions and have lab done.He will keep cath date as scheduled 06/26/17 at 8:30 am.Arrive at 6:30 am.

## 2017-06-24 ENCOUNTER — Telehealth: Payer: Self-pay

## 2017-06-24 NOTE — Telephone Encounter (Signed)
Patient contacted pre-catheterization at [facility] scheduled for:  06/26/2017 @ 0830 Verified arrival time and place:  [facility] @ 0630 Confirmed AM meds to be taken pre-cath with sip of water: Take ASA Last dose Metformin tonight 8/27 (provided education) Hold Amaryl AM of Confirmed patient has responsible person to drive home post procedure and observe patient for 24 hours:  yes Addl concerns:  None noted

## 2017-06-26 ENCOUNTER — Encounter (HOSPITAL_COMMUNITY)
Admission: RE | Disposition: A | Discharge status: Home / Self Care | Payer: Self-pay | Source: Ambulatory Visit | Attending: Cardiology

## 2017-06-26 ENCOUNTER — Ambulatory Visit (HOSPITAL_COMMUNITY)
Admission: RE | Admit: 2017-06-26 | Discharge: 2017-06-26 | Disposition: A | Discharge status: Home / Self Care | Payer: PPO | Source: Ambulatory Visit | Attending: Cardiology | Admitting: Cardiology

## 2017-06-26 ENCOUNTER — Encounter (HOSPITAL_COMMUNITY): Payer: Self-pay | Admitting: Cardiology

## 2017-06-26 DIAGNOSIS — K219 Gastro-esophageal reflux disease without esophagitis: Secondary | ICD-10-CM | POA: Insufficient documentation

## 2017-06-26 DIAGNOSIS — I252 Old myocardial infarction: Secondary | ICD-10-CM | POA: Insufficient documentation

## 2017-06-26 DIAGNOSIS — E039 Hypothyroidism, unspecified: Secondary | ICD-10-CM | POA: Diagnosis not present

## 2017-06-26 DIAGNOSIS — F329 Major depressive disorder, single episode, unspecified: Secondary | ICD-10-CM | POA: Diagnosis not present

## 2017-06-26 DIAGNOSIS — Z8546 Personal history of malignant neoplasm of prostate: Secondary | ICD-10-CM | POA: Diagnosis not present

## 2017-06-26 DIAGNOSIS — Z7902 Long term (current) use of antithrombotics/antiplatelets: Secondary | ICD-10-CM | POA: Insufficient documentation

## 2017-06-26 DIAGNOSIS — F419 Anxiety disorder, unspecified: Secondary | ICD-10-CM | POA: Insufficient documentation

## 2017-06-26 DIAGNOSIS — R809 Proteinuria, unspecified: Secondary | ICD-10-CM | POA: Insufficient documentation

## 2017-06-26 DIAGNOSIS — I25119 Atherosclerotic heart disease of native coronary artery with unspecified angina pectoris: Secondary | ICD-10-CM | POA: Diagnosis not present

## 2017-06-26 DIAGNOSIS — I209 Angina pectoris, unspecified: Secondary | ICD-10-CM | POA: Diagnosis present

## 2017-06-26 DIAGNOSIS — Z882 Allergy status to sulfonamides status: Secondary | ICD-10-CM | POA: Diagnosis not present

## 2017-06-26 DIAGNOSIS — E119 Type 2 diabetes mellitus without complications: Secondary | ICD-10-CM

## 2017-06-26 DIAGNOSIS — E785 Hyperlipidemia, unspecified: Secondary | ICD-10-CM | POA: Diagnosis not present

## 2017-06-26 DIAGNOSIS — G4733 Obstructive sleep apnea (adult) (pediatric): Secondary | ICD-10-CM | POA: Insufficient documentation

## 2017-06-26 DIAGNOSIS — I25719 Atherosclerosis of autologous vein coronary artery bypass graft(s) with unspecified angina pectoris: Secondary | ICD-10-CM | POA: Diagnosis not present

## 2017-06-26 DIAGNOSIS — Z951 Presence of aortocoronary bypass graft: Secondary | ICD-10-CM

## 2017-06-26 DIAGNOSIS — Z880 Allergy status to penicillin: Secondary | ICD-10-CM | POA: Insufficient documentation

## 2017-06-26 DIAGNOSIS — Z885 Allergy status to narcotic agent status: Secondary | ICD-10-CM | POA: Insufficient documentation

## 2017-06-26 DIAGNOSIS — G4700 Insomnia, unspecified: Secondary | ICD-10-CM | POA: Diagnosis not present

## 2017-06-26 DIAGNOSIS — I119 Hypertensive heart disease without heart failure: Secondary | ICD-10-CM | POA: Diagnosis not present

## 2017-06-26 HISTORY — PX: LEFT HEART CATH AND CORS/GRAFTS ANGIOGRAPHY: CATH118250

## 2017-06-26 LAB — GLUCOSE, CAPILLARY
Glucose-Capillary: 152 mg/dL — ABNORMAL HIGH (ref 65–99)
Glucose-Capillary: 113 mg/dL — ABNORMAL HIGH (ref 65–99)

## 2017-06-26 SURGERY — LEFT HEART CATH AND CORS/GRAFTS ANGIOGRAPHY
Anesthesia: LOCAL

## 2017-06-26 MED ORDER — LIDOCAINE HCL (PF) 1 % IJ SOLN
INTRAMUSCULAR | Status: DC | PRN
  Administered 2017-06-26: 2 mL

## 2017-06-26 MED ORDER — IOPAMIDOL (ISOVUE-370) INJECTION 76%
INTRAVENOUS | Status: DC | PRN
  Administered 2017-06-26: 100 mL via INTRAVENOUS

## 2017-06-26 MED ORDER — HEPARIN SODIUM (PORCINE) 1000 UNIT/ML IJ SOLN
INTRAMUSCULAR | Status: DC | PRN
  Administered 2017-06-26: 5000 [IU] via INTRAVENOUS

## 2017-06-26 MED ORDER — MIDAZOLAM HCL 2 MG/2ML IJ SOLN
INTRAMUSCULAR | Status: AC
  Filled 2017-06-26: qty 2

## 2017-06-26 MED ORDER — SODIUM CHLORIDE 0.9 % IV SOLN: 250.0000 mL | INTRAVENOUS | Status: DC | PRN

## 2017-06-26 MED ORDER — SODIUM CHLORIDE 0.9% FLUSH: 3.0000 mL | INTRAVENOUS | Status: DC | PRN

## 2017-06-26 MED ORDER — VERAPAMIL HCL 2.5 MG/ML IV SOLN
INTRAVENOUS | Status: DC | PRN
  Administered 2017-06-26: 09:00:00 via INTRA_ARTERIAL

## 2017-06-26 MED ORDER — SODIUM CHLORIDE 0.9 % WEIGHT BASED INFUSION: 1.0000 mL/kg/h | INTRAVENOUS | Status: AC

## 2017-06-26 MED ORDER — VERAPAMIL HCL 2.5 MG/ML IV SOLN
INTRAVENOUS | Status: AC
  Filled 2017-06-26: qty 2

## 2017-06-26 MED ORDER — SODIUM CHLORIDE 0.9% FLUSH: 3.0000 mL | Freq: Two times a day (BID) | INTRAVENOUS | Status: DC

## 2017-06-26 MED ORDER — FENTANYL CITRATE (PF) 100 MCG/2ML IJ SOLN
INTRAMUSCULAR | Status: AC
  Filled 2017-06-26: qty 2

## 2017-06-26 MED ORDER — FENTANYL CITRATE (PF) 100 MCG/2ML IJ SOLN
INTRAMUSCULAR | Status: DC | PRN
  Administered 2017-06-26: 25 ug via INTRAVENOUS

## 2017-06-26 MED ORDER — IOPAMIDOL (ISOVUE-370) INJECTION 76%
INTRAVENOUS | Status: AC
  Filled 2017-06-26: qty 125

## 2017-06-26 MED ORDER — SODIUM CHLORIDE 0.9 % IV SOLN
INTRAVENOUS | Status: DC
  Administered 2017-06-26: 08:00:00 via INTRAVENOUS

## 2017-06-26 MED ORDER — HEPARIN (PORCINE) IN NACL 2-0.9 UNIT/ML-% IJ SOLN
INTRAMUSCULAR | Status: DC | PRN
  Administered 2017-06-26: 08:00:00

## 2017-06-26 MED ORDER — MIDAZOLAM HCL 2 MG/2ML IJ SOLN
INTRAMUSCULAR | Status: DC | PRN
  Administered 2017-06-26: 0.5 mg via INTRAVENOUS

## 2017-06-26 MED ORDER — HEPARIN (PORCINE) IN NACL 2-0.9 UNIT/ML-% IJ SOLN
INTRAMUSCULAR | Status: AC
  Filled 2017-06-26: qty 500

## 2017-06-26 MED ORDER — METFORMIN HCL 1000 MG PO TABS: 1000.0000 mg | ORAL_TABLET | Freq: Two times a day (BID) | ORAL | Status: AC

## 2017-06-26 MED ORDER — HEPARIN (PORCINE) IN NACL 2-0.9 UNIT/ML-% IJ SOLN
INTRAMUSCULAR | Status: AC
  Filled 2017-06-26: qty 1000

## 2017-06-26 MED ORDER — LIDOCAINE HCL (PF) 1 % IJ SOLN
INTRAMUSCULAR | Status: AC
  Filled 2017-06-26: qty 30

## 2017-06-26 MED ORDER — ASPIRIN 81 MG PO CHEW
81.0000 mg | CHEWABLE_TABLET | ORAL | Status: DC
Start: 2017-06-26 — End: 2017-06-26

## 2017-06-26 MED ORDER — HEPARIN SODIUM (PORCINE) 1000 UNIT/ML IJ SOLN
INTRAMUSCULAR | Status: AC
  Filled 2017-06-26: qty 1

## 2017-06-26 SURGICAL SUPPLY — 10 items
CATH INFINITI 5 FR IM (CATHETERS) ×2 IMPLANT
CATH INFINITI 5FR MULTPACK ANG (CATHETERS) ×2 IMPLANT
DEVICE RAD COMP TR BAND LRG (VASCULAR PRODUCTS) ×2 IMPLANT
GLIDESHEATH SLEND SS 6F .021 (SHEATH) ×2 IMPLANT
GUIDEWIRE INQWIRE 1.5J.035X260 (WIRE) ×1 IMPLANT
INQWIRE 1.5J .035X260CM (WIRE) ×2
KIT HEART LEFT (KITS) ×2 IMPLANT
PACK CARDIAC CATHETERIZATION (CUSTOM PROCEDURE TRAY) ×2 IMPLANT
TRANSDUCER W/STOPCOCK (MISCELLANEOUS) ×2 IMPLANT
TUBING CIL FLEX 10 FLL-RA (TUBING) ×2 IMPLANT

## 2017-06-26 NOTE — Interval H&P Note (Signed)
History and Physical Interval Note:  06/26/2017 8:13 AM  Paul [name]  has presented today for surgery, with the diagnosis of cad, sob  The various methods of treatment have been discussed with the patient and family. After consideration of risks, benefits and other options for treatment, the patient has consented to  Procedure(s): LEFT HEART CATH AND CORS/GRAFTS ANGIOGRAPHY (N/A) as a surgical intervention .  The patient's history has been reviewed, patient examined, no change in status, stable for surgery.  I have reviewed the patient's chart and labs.  Questions were answered to the patient's satisfaction.    Cath Lab Visit (complete for each Cath Lab visit)  Clinical Evaluation Leading to the Procedure:   ACS: No.  Non-ACS:    Anginal Classification: CCS III  Anti-ischemic medical therapy: Maximal Therapy (2 or more classes of medications)  Non-Invasive Test Results: No non-invasive testing performed  Prior CABG: Previous CABG       Paul [facility] 06/26/2017 8:14 AM

## 2017-06-26 NOTE — H&P (View-Only) (Signed)
Office Visit    Patient Name: [facility] Date of Encounter: 06/19/2017  Primary Care Provider:  [name], MD Primary Cardiologist:  P. Martinique, MD   Chief Complaint    71 year old male with a history of CAD and CABG who is seen at the request of Dr. Addison Lank for evaluation of uncontrolled HTN and exertional fatigue.   Past Medical History    Past Medical History:  Diagnosis Date  . Allergy   . Coronary artery disease    a. s/p CABG;  b. 08/2015 MV: inf/inflat infarct w/ new peri-infarct isch; c. LHC 11/16: pLAD 70, dLAD 50, D1 100, oRI 100, mLCx 60, OM2 99, pRCA 99 (L>R collats), S-RPDA 100, S-D1 ok, S-RI/OM2 mid 85 (PCI: Synergy DES), L-LAD ok, EF 55-65%.  . Depression   . GERD (gastroesophageal reflux disease)   . Hyperlipidemia   . Hypertensive heart disease   . Hypothyroidism   . Insomnia   . OSA (obstructive sleep apnea)    CPAP  . Prostate cancer ([place])   . Type II diabetes mellitus ([place])    Past Surgical History:  Procedure Laterality Date  . CARDIAC CATHETERIZATION N/A 09/19/2015   Procedure: Coronary Stent Intervention;  Surgeon: Peter M Martinique, MD;  Location: [place] [facility];  Service: Cardiovascular;  Laterality: N/A;  . CARDIAC CATHETERIZATION  09/19/2015   Procedure: Left Heart Cath and Cors/Grafts Angiography;  Surgeon: Peter M Martinique, MD;  Location: [place] [facility];  Service: Cardiovascular;;  . CHOLECYSTECTOMY    . CORONARY ARTERY BYPASS GRAFT    . JOINT REPLACEMENT     right knee  . PROSTATE SURGERY      Allergies  Allergies  Allergen Reactions  . Brilinta [Ticagrelor] Shortness Of Breath  . Yellow Jacket Venom Anaphylaxis  . Alprazolam     No good  . Penicillins Other (See Comments)    "Eyes swell shut"  . Percocet [Oxycodone-Acetaminophen]     Nightmares/hallucinations  . Septra Ds [Sulfamethoxazole-Trimethoprim]     rash    History of Present Illness    71 year old male with the above past medical history including  CAD status post coronary artery bypass grafting in 2005, hypertension, hyperlipidemia, diabetes, hypothyroidism, and obstructive sleep apnea.  In November 2016, he also reported chest tightness and  underwent stress testing which showed inferolateral infarct with  peri-infarct ischemia. This resulted in diagnostic catheterization being performed and revealing severe disease in the vein graft to the OM 2. The vein graft to the PDA was occluded and the distal right coronary artery filled via left-to-right collaterals. The vein graft to the OM 2 was felt to be the culprit and was successfully stented. Following the procedure, patient initially had some shortness of breath which ultimately was secondary to Brilinta and once he was switched to Plavix, he felt significantly better.  On his last visit he was noted to have symptoms of dizziness felt to be related to his BP meds. His losartan was reduced. He states his symptoms continued until he stopped taking Jardiance and then his symptoms resolved. He is now on metformin and glimiperide.    More recently he states that Dr. Addison Lank found he was spilling protein in his urine. Seen by Dr. Mercy Moore who felt this was related to HTN and DM. BP was persistently high and Metoprolol and losartan doses were increased. He still complains of exertional fatigue. States he can work in his yard only about 10 minutes before he has to stop and  rest.  Uses CPAP but only sleeps 2-3 hours at a time. Naps frequently during the day. He has sporadic localized pain in the left breast. States he has had this for years.  He has been concerned about exercising because he is worried about his circulation. Initially after his stent in 2016 his fatigue improved but he is not sure now that it is better.   Home Medications    Allergies as of 06/19/2017      Reactions   Brilinta [ticagrelor] Shortness Of Breath   Yellow Jacket Venom Anaphylaxis   Alprazolam    No good   Penicillins Other  (See Comments)   "Eyes swell shut"   Percocet [oxycodone-acetaminophen]    Nightmares/hallucinations   Septra Ds [sulfamethoxazole-trimethoprim]    rash      Medication List       Accurate as of 06/19/17  5:30 PM. Always use your most recent med list.          amLODipine 5 MG tablet Commonly known as:  NORVASC Take 1 tablet (5 mg total) by mouth daily.   aspirin 81 MG tablet Take 81 mg by mouth daily.   clindamycin 150 MG capsule Commonly known as:  CLEOCIN Take 300 mg by mouth once as needed (1 hour prior to dental procedures). Reported on 02/22/2016   colestipol 1 g tablet Commonly known as:  COLESTID Take 1 g by mouth daily.   DULoxetine 60 MG capsule Commonly known as:  CYMBALTA Take 60 mg by mouth daily.   fluticasone 50 MCG/ACT nasal spray Commonly known as:  FLONASE Place 2 sprays into the nose daily.   glimepiride 4 MG tablet Commonly known as:  AMARYL   levothyroxine 75 MCG tablet Commonly known as:  SYNTHROID, LEVOTHROID Take 75 mcg by mouth daily.   liothyronine 5 MCG tablet Commonly known as:  CYTOMEL Take 5 mcg by mouth daily.   losartan 50 MG tablet Commonly known as:  COZAAR Take half a tablet (25mg ) daily   metFORMIN 1000 MG tablet Commonly known as:  GLUCOPHAGE Take 1,000 mg by mouth 2 (two) times daily.   metoprolol succinate 50 MG 24 hr tablet Commonly known as:  TOPROL-XL Take 1 tablet (50 mg total) by mouth daily.   MUCINEX PO Take 600 mg by mouth daily as needed (cough and congestion). As needed   multivitamin tablet Take 1 tablet by mouth daily. Reported on 11/10/2015   nitroGLYCERIN 0.4 MG SL tablet Commonly known as:  NITROSTAT Place 1 tablet (0.4 mg total) under the tongue every 5 (five) minutes as needed for chest pain.   ONETOUCH DELICA LANCETS 01B Misc   ONETOUCH VERIO test strip Generic drug:  glucose blood 1 each by Other route as needed for other (CHECK BLOOD SUGAR).   pantoprazole 20 MG tablet Commonly known  as:  PROTONIX Take 1 tablet (20 mg total) by mouth daily.   potassium citrate 10 MEQ (1080 MG) SR tablet Commonly known as:  UROCIT-K Take 2 tablets by mouth 2 (two) times daily.   simvastatin 20 MG tablet Commonly known as:  ZOCOR Take 1 tablet (20 mg total) by mouth at bedtime.   vitamin B-12 250 MCG tablet Commonly known as:  CYANOCOBALAMIN Take 250 mcg by mouth daily.            Discharge Care Instructions        Start     Ordered   06/19/17 0000  EKG 12-Lead    Question:  Where  should this test be performed  Answer:  Other   06/19/17 1724       Review of Systems    As noted in HPI. All other systems reviewed and are otherwise negative except as noted above.  Physical Exam    VS:  BP 140/82   Pulse 84   Ht 5\' 9"  (1.753 m)   Wt 211 lb (95.7 kg)   BMI 31.16 kg/m  , BMI Body mass index is 31.16 kg/m. BP with his cuff was 131/90.   GEN: Well nourished, well developed, in no acute distress.  HEENT: normal.  Neck: Supple, no JVD, carotid bruits, or masses. Cardiac: RRR, no murmurs, rubs, or gallops. No clubbing, cyanosis, edema.  Radials/DP/PT 2+ and equal bilaterally.  Respiratory:  Respirations regular and unlabored, clear to auscultation bilaterally. GI: Soft, nontender, nondistended, BS + x 4. MS: no deformity or atrophy. Skin: warm and dry, no rash. Neuro:  Strength and sensation are intact. Psych: Normal affect.  Accessory Clinical Findings    Lab Results  Component Value Date   WBC 5.6 12/07/2015   HGB 12.6 (L) 12/07/2015   HCT 38.0 (L) 12/07/2015   PLT 198 12/07/2015   GLUCOSE 132 (H) 07/20/2016   CHOL 154 07/20/2016   TRIG 235 (H) 07/20/2016   HDL 34 (L) 07/20/2016   LDLDIRECT 76.0 02/28/2015   LDLCALC 73 07/20/2016   ALT 18 07/20/2016   AST 23 07/20/2016   NA 140 07/20/2016   K 4.7 07/20/2016   CL 101 07/20/2016   CREATININE 1.21 (H) 07/20/2016   BUN 21 07/20/2016   CO2 26 07/20/2016   INR 0.99 09/15/2015   HGBA1C 7.3 (H)  02/28/2015   Labs dated 07/20/16: cholesterol 154, triglycerides 235, HDL 34, LDL 73.  Dated 08/27/16: CMET normal  Ecg today shows NSR with nonspecific TWA. I have personally reviewed and interpreted this study.   Assessment & Plan    1.  Exertional Fatigue. Differential includes ischemia with anginal equivalent symptoms, poor sleep architecture with OSA, Hypertensive heart disease, or effect of BP medication. We discussed changing his BP medication and seeing how he does versus pursuing ischemic evaluation. I don't think stress testing will be very helpful since it will be abnormal with his RCA occlusion. He had prior stenting in 2016 of SVG and now his grafts are 71 years old. I think the best evaluation would be to proceed directly to cardiac cath. The patient also has significant anxiety related to this. He is agreeable to proceed. Will plan cardiac cath on August 29. The procedure and risks were reviewed including but not limited to death, myocardial infarction, stroke, arrythmias, bleeding, transfusion, emergency surgery, dye allergy, or renal dysfunction. The patient voices understanding and is agreeable to proceed..   2. Coronary artery disease: Status post prior CABG in 2005 with PCI and DES placement to the vein graft to the second obtuse marginal in August 2016. Catheterization at that time also revealed a patent LIMA to the LAD and vein graft to the diagonal. The vein graft to the PDA was occluded as was the native right coronary artery, though the distal right coronary was fed via left-to-right collaterals.   He remains on aspirin,  statin, and beta blocker therapy. See plans for cardiac cath as noted above.  3. Hypertensive heart disease: BP elevated more recently. Will await results of cardiac cath.  May consider switching metoprolol to either Bystolic or Coreg for vasodilatory effects which may help with BP control and  lessen fatigue.   4. Hyperlipidemia: Continue statin therapy. Well  controlled.  5. Proteinuria - followed by Renal.   Peter Martinique, MD,FACC 06/19/2017, 5:30 PM

## 2017-06-26 NOTE — Discharge Instructions (Signed)
NO METFORMIN/GLUCOPHAGE FOR 2 DAYS ° ° °Radial Site Care °Refer to this sheet in the next few weeks. These instructions provide you with information about caring for yourself after your procedure. Your health care provider may also give you more specific instructions. Your treatment has been planned according to current medical practices, but problems sometimes occur. Call your health care provider if you have any problems or questions after your procedure. °What can I expect after the procedure? °After your procedure, it is typical to have the following: °· Bruising at the radial site that usually fades within 1-2 weeks. °· Blood collecting in the tissue (hematoma) that may be painful to the touch. It should usually decrease in size and tenderness within 1-2 weeks. ° °Follow these instructions at home: °· Take medicines only as directed by your health care provider. °· You may shower 24-48 hours after the procedure or as directed by your health care provider. Remove the bandage (dressing) and gently wash the site with plain soap and water. Pat the area dry with a clean towel. Do not rub the site, because this may cause bleeding. °· Do not take baths, swim, or use a hot tub until your health care provider approves. °· Check your insertion site every day for redness, swelling, or drainage. °· Do not apply powder or lotion to the site. °· Do not flex or bend the affected arm for 24 hours or as directed by your health care provider. °· Do not push or pull heavy objects with the affected arm for 24 hours or as directed by your health care provider. °· Do not lift over 10 lb (4.5 kg) for 5 days after your procedure or as directed by your health care provider. °· Ask your health care provider when it is okay to: °? Return to work or school. °? Resume usual physical activities or sports. °? Resume sexual activity. °· Do not drive home if you are discharged the same day as the procedure. Have someone else drive you. °· You  may drive 24 hours after the procedure unless otherwise instructed by your health care provider. °· Do not operate machinery or power tools for 24 hours after the procedure. °· If your procedure was done as an outpatient procedure, which means that you went home the same day as your procedure, a responsible adult should be with you for the first 24 hours after you arrive home. °· Keep all follow-up visits as directed by your health care provider. This is important. °Contact a health care provider if: °· You have a fever. °· You have chills. °· You have increased bleeding from the radial site. Hold pressure on the site. °Get help right away if: °· You have unusual pain at the radial site. °· You have redness, warmth, or swelling at the radial site. °· You have drainage (other than a small amount of blood on the dressing) from the radial site. °· The radial site is bleeding, and the bleeding does not stop after 30 minutes of holding steady pressure on the site. °· Your arm or hand becomes pale, cool, tingly, or numb. °This information is not intended to replace advice given to you by your health care provider. Make sure you discuss any questions you have with your health care provider. °Document Released: 11/17/2010 Document Revised: 03/22/2016 Document Reviewed: 05/03/2014 °Elsevier Interactive Patient Education © 2018 [employer] ° °

## 2017-07-02 ENCOUNTER — Other Ambulatory Visit: Payer: Self-pay | Admitting: Cardiology

## 2017-07-02 NOTE — Telephone Encounter (Signed)
REFILL 

## 2017-07-10 ENCOUNTER — Ambulatory Visit (INDEPENDENT_AMBULATORY_CARE_PROVIDER_SITE_OTHER): Payer: PPO | Admitting: Physician Assistant

## 2017-07-10 ENCOUNTER — Encounter: Payer: Self-pay | Admitting: Physician Assistant

## 2017-07-10 VITALS — BP 121/74 | HR 93 | Ht 69.0 in | Wt 214.0 lb

## 2017-07-10 DIAGNOSIS — G4733 Obstructive sleep apnea (adult) (pediatric): Secondary | ICD-10-CM | POA: Diagnosis not present

## 2017-07-10 DIAGNOSIS — I25810 Atherosclerosis of coronary artery bypass graft(s) without angina pectoris: Secondary | ICD-10-CM

## 2017-07-10 DIAGNOSIS — R42 Dizziness and giddiness: Secondary | ICD-10-CM

## 2017-07-10 DIAGNOSIS — E119 Type 2 diabetes mellitus without complications: Secondary | ICD-10-CM

## 2017-07-10 DIAGNOSIS — E039 Hypothyroidism, unspecified: Secondary | ICD-10-CM

## 2017-07-10 DIAGNOSIS — I10 Essential (primary) hypertension: Secondary | ICD-10-CM

## 2017-07-10 DIAGNOSIS — E782 Mixed hyperlipidemia: Secondary | ICD-10-CM | POA: Diagnosis not present

## 2017-07-10 MED ORDER — CARVEDILOL 6.25 MG PO TABS: 6.2500 mg | ORAL_TABLET | Freq: Two times a day (BID) | ORAL | 3 refills | Status: DC

## 2017-07-10 NOTE — Patient Instructions (Signed)
Medication Instructions:  STOP metoprolol  START carvedilol (Coreg) 6.25mg  two times daily  Labwork: Please make sure your PCP checks your thyroid (TSH).  You will need FASTING labs at your next visit with Dr. Martinique.  (lipid panel, hepatic)  Testing/Procedures: Your physician has requested that you have a carotid duplex. This test is an ultrasound of the carotid arteries in your neck. It looks at blood flow through these arteries that supply the brain with blood. Allow one hour for this exam. There are no restrictions or special instructions.  Follow-Up: Keep follow up with Dr. Martinique in October   Any Other Special Instructions Will Be Listed Below (If Applicable).     If you need a refill on your cardiac medications before your next appointment, please call your pharmacy.

## 2017-07-10 NOTE — Progress Notes (Signed)
Cardiology Office Note    Date:  07/12/2017   ID:  Paul [name], DOB 12-Jun-1946, MRN [id number]  PCP:  [name], MD  Cardiologist:  Dr. Martinique   Chief Complaint  Patient presents with  . Follow-up    seen for Dr. Martinique    History of Present Illness:  Paul [name] is a 71 y.o. male with PMH of CAD s/p CABG 2005, HTN, HLD, DM II, hypothyroidism and OSA. Cardiac catheterization in November 2016 revealed severe disease in SVG to OM 2 treated with stent, occluded SVG to PDA, distal RCA filled with left-to-right collaterals. He had some shortness of breath on Brilinta and was eventually switched to Plavix.  He was recently evaluated by his primary care provider who noted he has been having exertional fatigue. To further evaluate, cardiac catheterization was recommended. He underwent cardiac catheterization on 06/26/2017 which showed severe three-vessel disease, patent LIMA to LAD, patent SVG to ramus and OM branches sequentially, patent SVG to diagonal, known occluded SVG to RCA. No new disease to explain his symptoms.  Patient has been doing well after cardiac catheterization. He continued to have fatigue and weakness especially with exercise. He will need to have a TSH checked by his primary care provider. We plan to transition him from metoprolol to carvedilol to better control his blood pressure. Surprisingly his blood pressure is quite stable today. He says Dr. Martinique told him there are some other blood pressure cuff he would recommend, however he does not know which burned it is. He has been having some dizziness mainly with body position changes, it sounds orthostatic in nature. Occasionally., he also have some dizziness when he turned his head suddenly, he does not have significant carotid bruit on physical exam, however he is at risk for carotid artery stenosis. I will obtain a carotid ultrasound, this is not urgent. He will need a fasting lipid panel and LFT on the next office  visit.  Check with Dr. Martinique BP cuff?   Past Medical History:  Diagnosis Date  . Allergy   . Coronary artery disease    a. s/p CABG;  b. 08/2015 MV: inf/inflat infarct w/ new peri-infarct isch; c. LHC 11/16: pLAD 70, dLAD 50, D1 100, oRI 100, mLCx 60, OM2 99, pRCA 99 (L>R collats), S-RPDA 100, S-D1 ok, S-RI/OM2 mid 85 (PCI: Synergy DES), L-LAD ok, EF 55-65%.  . Depression   . GERD (gastroesophageal reflux disease)   . Hyperlipidemia   . Hypertensive heart disease   . Hypothyroidism   . Insomnia   . OSA (obstructive sleep apnea)    CPAP  . Prostate cancer ([place])   . Type II diabetes mellitus ([place])     Past Surgical History:  Procedure Laterality Date  . CARDIAC CATHETERIZATION N/A 09/19/2015   Procedure: Coronary Stent Intervention;  Surgeon: Peter M Martinique, MD;  Location: [facility];  Service: Cardiovascular;  Laterality: N/A;  . CARDIAC CATHETERIZATION  09/19/2015   Procedure: Left Heart Cath and Cors/Grafts Angiography;  Surgeon: Peter M Martinique, MD;  Location: [facility];  Service: Cardiovascular;;  . CHOLECYSTECTOMY    . CORONARY ARTERY BYPASS GRAFT    . JOINT REPLACEMENT     right knee  . LEFT HEART CATH AND CORS/GRAFTS ANGIOGRAPHY N/A 06/26/2017   Procedure: LEFT HEART CATH AND CORS/GRAFTS ANGIOGRAPHY;  Surgeon: Martinique, Peter M, MD;  Location: [facility];  Service: Cardiovascular;  Laterality: N/A;  . PROSTATE SURGERY  Current Medications: Outpatient Medications Prior to Visit  Medication Sig Dispense Refill  . acetaminophen (TYLENOL 8 HOUR ARTHRITIS PAIN) 650 MG CR tablet Take 1,300 mg by mouth every 8 (eight) hours as needed for pain.    Marland Kitchen amLODipine (NORVASC) 5 MG tablet TAKE ONE TABLET BY MOUTH ONCE DAILY 90 tablet 1  . aspirin EC 81 MG tablet Take 81 mg by mouth daily.    . Cholecalciferol (VITAMIN D) 2000 units tablet Take 2,000 Units by mouth daily.    . clindamycin (CLEOCIN) 150 MG capsule Take 300 mg by mouth See admin instructions.  Take 2 capsules (300 mg) 1 hour before dental procedure.    . Coenzyme Q10 (COQ10) 200 MG CAPS Take 200 mg by mouth 2 (two) times daily.    . colestipol (COLESTID) 1 G tablet Take 1 g by mouth at bedtime.     . DULoxetine (CYMBALTA) 60 MG capsule Take 60 mg by mouth daily with breakfast.     . EPINEPHrine (EPIPEN 2-PAK) 0.3 mg/0.3 mL IJ SOAJ injection Inject 0.3 mg into the muscle daily as needed (for anaphylatic allergic reaction.).    Marland Kitchen fluticasone (FLONASE) 50 MCG/ACT nasal spray Place 2 sprays into both nostrils daily as needed for allergies or rhinitis.    Marland Kitchen glimepiride (AMARYL) 4 MG tablet Take 4 mg by mouth daily with breakfast.     . guaiFENesin (MUCINEX) 600 MG 12 hr tablet Take 600-1,200 mg by mouth 2 (two) times daily as needed (for sinus drainage/cough/congestion).    Marland Kitchen levothyroxine (SYNTHROID, LEVOTHROID) 75 MCG tablet Take 75 mcg by mouth daily before breakfast.     . liothyronine (CYTOMEL) 5 MCG tablet Take 5 mcg by mouth daily with breakfast.     . loperamide (IMODIUM A-D) 2 MG tablet Take 2-4 mg by mouth 4 (four) times daily as needed for diarrhea or loose stools.    Marland Kitchen losartan (COZAAR) 50 MG tablet Take half a tablet (25mg ) daily (Patient taking differently: Take 50 mg by mouth 2 (two) times daily. Take half a tablet (25mg ) daily) 45 tablet 3  . metFORMIN (GLUCOPHAGE) 1000 MG tablet Take 1 tablet (1,000 mg total) by mouth 2 (two) times daily.    . Multiple Vitamin (MULTIVITAMIN) tablet Take 1 tablet by mouth daily. Reported on 11/10/2015    . nitroGLYCERIN (NITROSTAT) 0.4 MG SL tablet Place 1 tablet (0.4 mg total) under the tongue every 5 (five) minutes as needed for chest pain. 25 tablet 11  . pantoprazole (PROTONIX) 20 MG tablet Take 1 tablet (20 mg total) by mouth daily. (Patient taking differently: Take 20 mg by mouth daily before breakfast. ) 90 tablet 0  . potassium citrate (UROCIT-K) 10 MEQ (1080 MG) SR tablet Take 10 mEq by mouth 2 (two) times daily.     . simvastatin  (ZOCOR) 20 MG tablet Take 1 tablet (20 mg total) by mouth at bedtime. 90 tablet 3  . vitamin B-12 (CYANOCOBALAMIN) 250 MCG tablet Take 250 mcg by mouth daily.    . metoprolol succinate (TOPROL-XL) 100 MG 24 hr tablet Take 100 mg by mouth daily. Take with or immediately following a meal.     No facility-administered medications prior to visit.      Allergies:   Brilinta [ticagrelor]; Yellow jacket venom; Morphine; Penicillins; Percocet [oxycodone-acetaminophen]; Septra ds [sulfamethoxazole-trimethoprim]; Vicodin [hydrocodone-acetaminophen]; and Alprazolam   Social History   Social History  . Marital status: Married    Spouse name: N/A  . Number of children: N/A  .  Years of education: N/A   Social History Main Topics  . Smoking status: Never Smoker  . Smokeless tobacco: Never Used  . Alcohol use No  . Drug use: No  . Sexual activity: Not Asked   Other Topics Concern  . None   Social History Narrative  . None     Family History:  The patient's family history includes Heart attack in his father; Heart disease in his mother; Vascular Disease in his sister.   ROS:   Please see the history of present illness.    ROS All other systems reviewed and are negative.   PHYSICAL EXAM:   VS:  BP 121/74   Pulse 93   Ht 5\' 9"  (1.753 m)   Wt 214 lb (97.1 kg)   BMI 31.60 kg/m    GEN: Well nourished, well developed, in no acute distress  HEENT: normal  Neck: no JVD, carotid bruits, or masses Cardiac: RRR; no murmurs, rubs, or gallops,no edema  Respiratory:  clear to auscultation bilaterally, normal work of breathing GI: soft, nontender, nondistended, + BS MS: no deformity or atrophy  Skin: warm and dry, no rash Neuro:  Alert and Oriented x 3, Strength and sensation are intact Psych: euthymic mood, full affect  Wt Readings from Last 3 Encounters:  07/10/17 214 lb (97.1 kg)  06/26/17 212 lb (96.2 kg)  06/19/17 211 lb (95.7 kg)      Studies/Labs Reviewed:   EKG:  EKG is not  ordered today.    Recent Labs: 07/20/2016: ALT 18 06/21/2017: BUN 24; Creatinine, Ser 1.29; Hemoglobin 12.7; Platelets 211; Potassium 4.7; Sodium 141   Lipid Panel    Component Value Date/Time   CHOL 154 07/20/2016 0950   TRIG 235 (H) 07/20/2016 0950   HDL 34 (L) 07/20/2016 0950   CHOLHDL 4.5 07/20/2016 0950   VLDL 47 (H) 07/20/2016 0950   LDLCALC 73 07/20/2016 0950   LDLDIRECT 76.0 02/28/2015 0939    Additional studies/ records that were reviewed today include:   Cath 06/26/2017 Conclusion     Prox RCA to Dist RCA lesion, 100 %stenosed.  Prox LAD to Mid LAD lesion, 70 %stenosed.  Dist LAD lesion, 50 %stenosed.  1st Diag lesion, 100 %stenosed.  Ost Ramus lesion, 100 %stenosed.  2nd Mrg lesion, 99 %stenosed.  Mid Cx to Dist Cx lesion, 60 %stenosed.  SVG to RCA- Origin lesion, 100 %stenosed.  SVG sequential to ramus intermediate and OM is widely patent.  SVG to diagonal is patent.  LIMA and is normal in caliber.  LV end diastolic pressure is normal.   1. Severe 3 vessel obstructive CAD 2. Patent LIMA to the LAD 3. Patent SVG to ramus and OM branches sequentially. Stent is widely patent 4. Patent SVG to diagonal 5. SVG to RCA known to be occluded. 6. Normal LVEDP  Plan: no new disease to explain symptoms. Recommend continued medical therapy.      ASSESSMENT:    1. Dizziness   2. Mixed hyperlipidemia   3. Coronary artery disease involving coronary bypass graft of native heart without angina pectoris   4. Essential hypertension   5. Controlled type 2 diabetes mellitus without complication, without long-term current use of insulin ([place])   6. Hypothyroidism, unspecified type   7. OSA (obstructive sleep apnea)      PLAN:  In order of problems listed above:  1. Dizziness: Sounds more orthostatic in nature, I asked him to get up slowly. If it is getting more severe, we may  have to discontinue the amlodipine. He will need a TSH when he see his primary  care provider. He also occasionally notice dizziness when he turns his head, I will obtain carotid ultrasound.  2. CAD s/p CABG: No obvious chest pain recently   3. Hypertension: Blood pressure stable, transitioning from metoprolol to carvedilol. He will monitor for worsening fatigue  4. Hyperlipidemia: Continue Zocor 20 mg daily, planning for fasting lipid panel and LFTs  5. Hypothyroidism: On Synthroid    Medication Adjustments/Labs and Tests Ordered: Current medicines are reviewed at length with the patient today.  Concerns regarding medicines are outlined above.  Medication changes, Labs and Tests ordered today are listed in the Patient Instructions below. Patient Instructions  Medication Instructions:  STOP metoprolol  START carvedilol (Coreg) 6.25mg  two times daily  Labwork: Please make sure your PCP checks your thyroid (TSH).  You will need FASTING labs at your next visit with Dr. Martinique.  (lipid panel, hepatic)  Testing/Procedures: Your physician has requested that you have a carotid duplex. This test is an ultrasound of the carotid arteries in your neck. It looks at blood flow through these arteries that supply the brain with blood. Allow one hour for this exam. There are no restrictions or special instructions.  Follow-Up: Keep follow up with Dr. Martinique in October   Any Other Special Instructions Will Be Listed Below (If Applicable).     If you need a refill on your cardiac medications before your next appointment, please call your pharmacy.      Hilbert Corrigan, [place]  07/12/2017 6:19 AM    [address], [place], [place]  [address] Phone: [phone]; Fax: [phone]

## 2017-07-12 ENCOUNTER — Encounter: Payer: Self-pay | Admitting: Physician Assistant

## 2017-07-19 DIAGNOSIS — Z23 Encounter for immunization: Secondary | ICD-10-CM | POA: Diagnosis not present

## 2017-07-19 DIAGNOSIS — R5383 Other fatigue: Secondary | ICD-10-CM | POA: Diagnosis not present

## 2017-07-19 DIAGNOSIS — E669 Obesity, unspecified: Secondary | ICD-10-CM | POA: Diagnosis not present

## 2017-07-19 DIAGNOSIS — I10 Essential (primary) hypertension: Secondary | ICD-10-CM | POA: Diagnosis not present

## 2017-07-19 DIAGNOSIS — R809 Proteinuria, unspecified: Secondary | ICD-10-CM | POA: Diagnosis not present

## 2017-07-22 ENCOUNTER — Encounter (INDEPENDENT_AMBULATORY_CARE_PROVIDER_SITE_OTHER): Payer: Self-pay

## 2017-07-22 ENCOUNTER — Ambulatory Visit (HOSPITAL_COMMUNITY)
Admission: RE | Admit: 2017-07-22 | Discharge: 2017-07-22 | Disposition: A | Discharge status: Home / Self Care | Payer: PPO | Source: Ambulatory Visit | Attending: Internal Medicine | Admitting: Internal Medicine

## 2017-07-22 ENCOUNTER — Telehealth: Payer: Self-pay | Admitting: Cardiology

## 2017-07-22 DIAGNOSIS — I6523 Occlusion and stenosis of bilateral carotid arteries: Secondary | ICD-10-CM | POA: Diagnosis not present

## 2017-07-22 DIAGNOSIS — R42 Dizziness and giddiness: Secondary | ICD-10-CM | POA: Insufficient documentation

## 2017-07-22 NOTE — Telephone Encounter (Signed)
Received records from [facility] for appointment on 08/05/17 with Dr Martinique.  Records put with Dr Doug Sou schedule for 08/05/17. lp

## 2017-07-24 ENCOUNTER — Telehealth: Payer: Self-pay | Admitting: Cardiology

## 2017-07-24 NOTE — Telephone Encounter (Signed)
Returned call to patient he stated he has a omron B/P cuff and Dr.Jordan mentioned another cuff to buy.Advised Dr.Jordan out of office today. I will check with him tomorrow and call you back.

## 2017-07-24 NOTE — Telephone Encounter (Signed)
New message      Talk to the nurse to get the name of the bp cuff Dr Martinique want him to buy.  Please call

## 2017-07-25 NOTE — Progress Notes (Signed)
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

## 2017-07-26 NOTE — Telephone Encounter (Signed)
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

## 2017-07-26 NOTE — Telephone Encounter (Signed)
No answer, goes to VM. OK to leave detailed msg. Left msg w test findings, and to call back to discuss results if he needs to.

## 2017-07-26 NOTE — Telephone Encounter (Signed)
Returned call to patient spoke to Dr.Jordan he advised to purchase Omron B/P cuff.

## 2017-08-01 ENCOUNTER — Other Ambulatory Visit: Payer: Self-pay | Admitting: Cardiology

## 2017-08-01 NOTE — Progress Notes (Signed)
Office Visit    Patient Name: [name] Date of Encounter: 08/05/2017  Primary Care Provider:  [name], MD Primary Cardiologist:  P. Martinique, MD   Chief Complaint    71 year old male with a history of CAD and CABG who is seen for follow up.  Past Medical History    Past Medical History:  Diagnosis Date  . Allergy   . Coronary artery disease    a. s/p CABG;  b. 08/2015 MV: inf/inflat infarct w/ new peri-infarct isch; c. LHC 11/16: pLAD 70, dLAD 50, D1 100, oRI 100, mLCx 60, OM2 99, pRCA 99 (L>R collats), S-RPDA 100, S-D1 ok, S-RI/OM2 mid 85 (PCI: Synergy DES), L-LAD ok, EF 55-65%.  . Depression   . GERD (gastroesophageal reflux disease)   . Hyperlipidemia   . Hypertensive heart disease   . Hypothyroidism   . Insomnia   . OSA (obstructive sleep apnea)    CPAP  . Prostate cancer ([place])   . Type II diabetes mellitus ([place])    Past Surgical History:  Procedure Laterality Date  . CARDIAC CATHETERIZATION N/A 09/19/2015   Procedure: Coronary Stent Intervention;  Surgeon: Reggie Bise M Martinique, MD;  Location: [facility];  Service: Cardiovascular;  Laterality: N/A;  . CARDIAC CATHETERIZATION  09/19/2015   Procedure: Left Heart Cath and Cors/Grafts Angiography;  Surgeon: Paulino Cork M Martinique, MD;  Location: [facility];  Service: Cardiovascular;;  . CHOLECYSTECTOMY    . CORONARY ARTERY BYPASS GRAFT    . JOINT REPLACEMENT     right knee  . LEFT HEART CATH AND CORS/GRAFTS ANGIOGRAPHY N/A 06/26/2017   Procedure: LEFT HEART CATH AND CORS/GRAFTS ANGIOGRAPHY;  Surgeon: Martinique, Telina Kleckley M, MD;  Location: [facility];  Service: Cardiovascular;  Laterality: N/A;  . PROSTATE SURGERY      Allergies  Allergies  Allergen Reactions  . Brilinta [Ticagrelor] Shortness Of Breath  . Yellow Jacket Venom Anaphylaxis  . Morphine Other (See Comments)    Hallucinations/nightmares when taking for greater than 24hrs.  . Penicillins Other (See Comments)    "Eyes swell  shut" Has patient had a PCN reaction causing immediate rash, facial/tongue/throat swelling, SOB or lightheadedness with hypotension:Yes Has patient had a PCN reaction causing severe rash involving mucus membranes or skin necrosis:No Has patient had a PCN reaction that required hospitalization: No Has patient had a PCN reaction occurring within the last 10 years: No If all of the above answers are "NO", then may proceed with Cephalosporin use.   Marland Kitchen Percocet [Oxycodone-Acetaminophen]     Nightmares/hallucinations  . Septra Ds [Sulfamethoxazole-Trimethoprim]     rash  . Vicodin [Hydrocodone-Acetaminophen] Other (See Comments)    Hallucinations/nightmares  . Alprazolam Rash    History of Present Illness    71 year old male with the above past medical history including CAD status post coronary artery bypass grafting in 2005, hypertension, hyperlipidemia, diabetes, hypothyroidism, and obstructive sleep apnea.  In November 2016, he also reported chest tightness and  underwent stress testing which showed inferolateral infarct with  peri-infarct ischemia. This resulted in diagnostic catheterization being performed and revealing severe disease in the vein graft to the OM 2. The vein graft to the PDA was occluded and the distal right coronary artery filled via left-to-right collaterals. The vein graft to the OM 2 was felt to be the culprit and was successfully stented. Following the procedure, patient initially had some shortness of breath which ultimately was secondary to Brilinta and once he was switched to Plavix,  he felt significantly better.   On his last visit he was noted to have symptoms of dizziness felt to be related to his BP meds. His losartan was reduced. He states his symptoms continued until he stopped taking Jardiance and then his symptoms resolved. He is now on metformin and glimiperide.    More recently he was evaluated for proteinuria. Dr. Mercy Moore  felt this was related to HTN and DM.  BP was persistently high and Metoprolol and losartan doses were increased. He continued to  complain of exertional fatigue. He underwent repeat cardiac cath that showed patent grafts except known occlusion of SVG to RCA. No new disease to explain his symptoms. He was later switched from metoprolol to Coreg for better BP control.   He continues to note symptoms of fatigue and some dyspnea with exertion.   Uses CPAP and is scheduled to get a new mask.  Realizes he doesn't handle stress well. BP still elevated from 096-283 systolic at home.   Home Medications    Allergies as of 08/05/2017      Reactions   Brilinta [ticagrelor] Shortness Of Breath   Yellow Jacket Venom Anaphylaxis   Morphine Other (See Comments)   Hallucinations/nightmares when taking for greater than 24hrs.   Penicillins Other (See Comments)   "Eyes swell shut" Has patient had a PCN reaction causing immediate rash, facial/tongue/throat swelling, SOB or lightheadedness with hypotension:Yes Has patient had a PCN reaction causing severe rash involving mucus membranes or skin necrosis:No Has patient had a PCN reaction that required hospitalization: No Has patient had a PCN reaction occurring within the last 10 years: No If all of the above answers are "NO", then may proceed with Cephalosporin use.   Percocet [oxycodone-acetaminophen]    Nightmares/hallucinations   Septra Ds [sulfamethoxazole-trimethoprim]    rash   Vicodin [hydrocodone-acetaminophen] Other (See Comments)   Hallucinations/nightmares   Alprazolam Rash      Medication List       Accurate as of 08/05/17 11:12 AM. Always use your most recent med list.          amLODipine 5 MG tablet Commonly known as:  NORVASC TAKE ONE TABLET BY MOUTH ONCE DAILY   aspirin EC 81 MG tablet Take 81 mg by mouth daily.   carvedilol 12.5 MG tablet Commonly known as:  COREG Take 1 tablet (12.5 mg total) by mouth 2 (two) times daily.   clindamycin 150 MG capsule Commonly  known as:  CLEOCIN Take 300 mg by mouth See admin instructions. Take 2 capsules (300 mg) 1 hour before dental procedure.   colestipol 1 g tablet Commonly known as:  COLESTID Take 1 g by mouth at bedtime.   CoQ10 200 MG Caps Take 200 mg by mouth 2 (two) times daily.   DULoxetine 60 MG capsule Commonly known as:  CYMBALTA Take 60 mg by mouth daily with breakfast.   EPIPEN 2-PAK 0.3 mg/0.3 mL Soaj injection Generic drug:  EPINEPHrine Inject 0.3 mg into the muscle daily as needed (for anaphylatic allergic reaction.).   fluticasone 50 MCG/ACT nasal spray Commonly known as:  FLONASE Place 2 sprays into both nostrils daily as needed for allergies or rhinitis.   glimepiride 4 MG tablet Commonly known as:  AMARYL Take 4 mg by mouth daily with breakfast.   guaiFENesin 600 MG 12 hr tablet Commonly known as:  MUCINEX Take 600-1,200 mg by mouth 2 (two) times daily as needed (for sinus drainage/cough/congestion).   levothyroxine 75 MCG tablet Commonly known as:  SYNTHROID, LEVOTHROID Take 75 mcg by mouth daily before breakfast.   liothyronine 5 MCG tablet Commonly known as:  CYTOMEL Take 5 mcg by mouth daily with breakfast.   loperamide 2 MG tablet Commonly known as:  IMODIUM A-D Take 2-4 mg by mouth 4 (four) times daily as needed for diarrhea or loose stools.   losartan 50 MG tablet Commonly known as:  COZAAR Take half a tablet (25mg ) daily   metFORMIN 1000 MG tablet Commonly known as:  GLUCOPHAGE Take 1 tablet (1,000 mg total) by mouth 2 (two) times daily.   multivitamin tablet Take 1 tablet by mouth daily. Reported on 11/10/2015   nitroGLYCERIN 0.4 MG SL tablet Commonly known as:  NITROSTAT Place 1 tablet (0.4 mg total) under the tongue every 5 (five) minutes as needed for chest pain.   pantoprazole 20 MG tablet Commonly known as:  PROTONIX Take 1 tablet (20 mg total) by mouth daily.   potassium citrate 10 MEQ (1080 MG) SR tablet Commonly known as:  UROCIT-K Take 10  mEq by mouth 2 (two) times daily.   simvastatin 20 MG tablet Commonly known as:  ZOCOR TAKE ONE TABLET BY MOUTH AT BEDTIME   TYLENOL 8 HOUR ARTHRITIS PAIN 650 MG CR tablet Generic drug:  acetaminophen Take 1,300 mg by mouth every 8 (eight) hours as needed for pain.   vitamin B-12 250 MCG tablet Commonly known as:  CYANOCOBALAMIN Take 250 mcg by mouth daily.   Vitamin D 2000 units tablet Take 2,000 Units by mouth daily.        Review of Systems    As noted in HPI. All other systems reviewed and are otherwise negative except as noted above.  Physical Exam    VS:  BP 139/78   Pulse (!) 101   Ht 5\' 9"  (1.753 m)   Wt 216 lb 9.6 oz (98.2 kg)   BMI 31.99 kg/m  , BMI Body mass index is 31.99 kg/m. BP with his cuff was 131/90.   GENERAL:  Well appearing WM overweight. HEENT:  PERRL, EOMI, sclera are clear. Oropharynx is clear. NECK:  No jugular venous distention, carotid upstroke brisk and symmetric, no bruits, no thyromegaly or adenopathy LUNGS:  Clear to auscultation bilaterally CHEST:  Unremarkable HEART:  RRR,  PMI not displaced or sustained,S1 and S2 within normal limits, no S3, no S4: no clicks, no rubs, no murmurs ABD:  Soft, nontender. BS +, no masses or bruits. No hepatomegaly, no splenomegaly EXT:  2 + pulses throughout, no edema, no cyanosis no clubbing SKIN:  Warm and dry.  No rashes NEURO:  Alert and oriented x 3. Cranial nerves II through XII intact. PSYCH:  Cognitively intact    Accessory Clinical Findings    Lab Results  Component Value Date   WBC 5.9 06/21/2017   HGB 12.7 (L) 06/21/2017   HCT 38.2 06/21/2017   PLT 211 06/21/2017   GLUCOSE 93 06/21/2017   CHOL 154 07/20/2016   TRIG 235 (H) 07/20/2016   HDL 34 (L) 07/20/2016   LDLDIRECT 76.0 02/28/2015   LDLCALC 73 07/20/2016   ALT 18 07/20/2016   AST 23 07/20/2016   NA 141 06/21/2017   K 4.7 06/21/2017   CL 101 06/21/2017   CREATININE 1.29 (H) 06/21/2017   BUN 24 06/21/2017   CO2 22  06/21/2017   INR 1.0 06/21/2017   HGBA1C 7.3 (H) 02/28/2015   Labs dated 07/20/16: cholesterol 154, triglycerides 235, HDL 34, LDL 73.  Dated 08/27/16: CMET normal  Dated 04/26/17: A1c 8%. Chemistries normal   Procedures   LEFT HEART CATH AND CORS/GRAFTS ANGIOGRAPHY  Conclusion     Prox RCA to Dist RCA lesion, 100 %stenosed.  Prox LAD to Mid LAD lesion, 70 %stenosed.  Dist LAD lesion, 50 %stenosed.  1st Diag lesion, 100 %stenosed.  Ost Ramus lesion, 100 %stenosed.  2nd Mrg lesion, 99 %stenosed.  Mid Cx to Dist Cx lesion, 60 %stenosed.  SVG to RCA- Origin lesion, 100 %stenosed.  SVG sequential to ramus intermediate and OM is widely patent.  SVG to diagonal is patent.  LIMA and is normal in caliber.  LV end diastolic pressure is normal.   1. Severe 3 vessel obstructive CAD 2. Patent LIMA to the LAD 3. Patent SVG to ramus and OM branches sequentially. Stent is widely patent 4. Patent SVG to diagonal 5. SVG to RCA known to be occluded. 6. Normal LVEDP  Plan: no new disease to explain symptoms. Recommend continued medical therapy.      Assessment & Plan    1.  Exertional Fatigue. I suspect there is a significant component of deconditioning. Also related to hypertensive heart disease and OSA. I spoke to him about regular aerobic exercise with goal to improve his conditioning over the next 6 months. Needs to lose weight. Exercise really needs to be daily.  2. Coronary artery disease: Status post prior CABG in 2005 with PCI and DES placement to the vein graft to the second obtuse marginal in August 2016. Catheterization at that time also revealed a patent LIMA to the LAD and vein graft to the diagonal. The vein graft to the PDA was occluded as was the native right coronary artery, though the distal right coronary was fed via left-to-right collaterals.   Recent cardiac cath showed no change in disease. He remains on aspirin,  statin, and beta blocker therapy.   3.  Hypertensive heart disease: BP elevated more recently. Tolerating Coreg well but need to titrate. Will increase to 12.5 mg bid.    4. Hyperlipidemia: Continue statin therapy. Well controlled.  5. Proteinuria - followed by [facility].  6. Carotid arterial disease with moderate LICA stenosis- follow up in one year.   Toniya Rozar Martinique, MD,FACC 08/05/2017, 11:12 AM

## 2017-08-05 ENCOUNTER — Encounter: Payer: Self-pay | Admitting: Cardiology

## 2017-08-05 ENCOUNTER — Ambulatory Visit (INDEPENDENT_AMBULATORY_CARE_PROVIDER_SITE_OTHER): Payer: PPO | Admitting: Cardiology

## 2017-08-05 VITALS — BP 139/78 | HR 101 | Ht 69.0 in | Wt 216.6 lb

## 2017-08-05 DIAGNOSIS — I10 Essential (primary) hypertension: Secondary | ICD-10-CM | POA: Diagnosis not present

## 2017-08-05 DIAGNOSIS — I25810 Atherosclerosis of coronary artery bypass graft(s) without angina pectoris: Secondary | ICD-10-CM

## 2017-08-05 DIAGNOSIS — E782 Mixed hyperlipidemia: Secondary | ICD-10-CM | POA: Diagnosis not present

## 2017-08-05 DIAGNOSIS — Z951 Presence of aortocoronary bypass graft: Secondary | ICD-10-CM | POA: Diagnosis not present

## 2017-08-05 LAB — HEPATIC FUNCTION PANEL
ALT: 17 IU/L (ref 0–44)
AST: 21 IU/L (ref 0–40)
Albumin: 4.3 g/dL (ref 3.5–4.8)
Alkaline Phosphatase: 41 IU/L (ref 39–117)
Bilirubin Total: 0.4 mg/dL (ref 0.0–1.2)
Bilirubin, Direct: 0.12 mg/dL (ref 0.00–0.40)
Total Protein: 7 g/dL (ref 6.0–8.5)

## 2017-08-05 LAB — LIPID PANEL
CHOLESTEROL TOTAL: 152 mg/dL (ref 100–199)
Chol/HDL Ratio: 4.5 ratio (ref 0.0–5.0)
HDL: 34 mg/dL — ABNORMAL LOW (ref >39)
LDL CALC: 47 mg/dL (ref 0–99)
TRIGLYCERIDES: 355 mg/dL — AB (ref 0–149)
VLDL CHOLESTEROL CAL: 71 mg/dL — AB (ref 5–40)

## 2017-08-05 MED ORDER — CARVEDILOL 12.5 MG PO TABS: 12.5000 mg | ORAL_TABLET | Freq: Two times a day (BID) | ORAL | 3 refills | Status: DC

## 2017-08-05 NOTE — Patient Instructions (Signed)
We will increase Carvedilol to 12.5 mg twice a day  You really need to focus on increasing your aerobic activity with focus on improving your conditioning.   I will see you in 6 months.

## 2017-08-06 NOTE — Progress Notes (Signed)
Cholesterol and LDL were controlled. Triglyceride very high, recommend adding Lovaza 1000mg  BID.

## 2017-08-07 DIAGNOSIS — Z8546 Personal history of malignant neoplasm of prostate: Secondary | ICD-10-CM | POA: Diagnosis not present

## 2017-08-07 DIAGNOSIS — G4733 Obstructive sleep apnea (adult) (pediatric): Secondary | ICD-10-CM | POA: Diagnosis not present

## 2017-08-21 DIAGNOSIS — Z8546 Personal history of malignant neoplasm of prostate: Secondary | ICD-10-CM | POA: Diagnosis not present

## 2017-08-21 DIAGNOSIS — N5201 Erectile dysfunction due to arterial insufficiency: Secondary | ICD-10-CM | POA: Diagnosis not present

## 2017-08-21 DIAGNOSIS — Z87442 Personal history of urinary calculi: Secondary | ICD-10-CM | POA: Diagnosis not present

## 2017-09-10 ENCOUNTER — Telehealth: Payer: Self-pay | Admitting: Cardiology

## 2017-09-10 NOTE — Telephone Encounter (Signed)
Please call,he needs to give you an update oon his blood pressure medicine.

## 2017-09-10 NOTE — Telephone Encounter (Signed)
Returned call to patient he stated B/P is still elevated.B/P ranging 131/84,151/87,150/97,163/92,158/97,167/100. Pulse 80's to 90's.Stated he is taking medications as prescribed.I will send message to [facility] for advice.

## 2017-09-11 DIAGNOSIS — Z7189 Other specified counseling: Secondary | ICD-10-CM | POA: Diagnosis not present

## 2017-09-11 DIAGNOSIS — N183 Chronic kidney disease, stage 3 (moderate): Secondary | ICD-10-CM | POA: Diagnosis not present

## 2017-09-11 DIAGNOSIS — K219 Gastro-esophageal reflux disease without esophagitis: Secondary | ICD-10-CM | POA: Diagnosis not present

## 2017-09-11 DIAGNOSIS — I10 Essential (primary) hypertension: Secondary | ICD-10-CM | POA: Diagnosis not present

## 2017-09-11 DIAGNOSIS — Z0000 Encounter for general adult medical examination without abnormal findings: Secondary | ICD-10-CM | POA: Diagnosis not present

## 2017-09-11 DIAGNOSIS — F324 Major depressive disorder, single episode, in partial remission: Secondary | ICD-10-CM | POA: Diagnosis not present

## 2017-09-11 DIAGNOSIS — E785 Hyperlipidemia, unspecified: Secondary | ICD-10-CM | POA: Diagnosis not present

## 2017-09-11 DIAGNOSIS — E039 Hypothyroidism, unspecified: Secondary | ICD-10-CM | POA: Diagnosis not present

## 2017-09-11 DIAGNOSIS — E1122 Type 2 diabetes mellitus with diabetic chronic kidney disease: Secondary | ICD-10-CM | POA: Diagnosis not present

## 2017-09-11 DIAGNOSIS — I2510 Atherosclerotic heart disease of native coronary artery without angina pectoris: Secondary | ICD-10-CM | POA: Diagnosis not present

## 2017-09-11 DIAGNOSIS — E669 Obesity, unspecified: Secondary | ICD-10-CM | POA: Diagnosis not present

## 2017-09-11 DIAGNOSIS — G4733 Obstructive sleep apnea (adult) (pediatric): Secondary | ICD-10-CM | POA: Diagnosis not present

## 2017-09-11 MED ORDER — CARVEDILOL 25 MG PO TABS: 25.0000 mg | ORAL_TABLET | Freq: Two times a day (BID) | ORAL | 3 refills | Status: DC

## 2017-09-11 NOTE — Telephone Encounter (Signed)
I would increase Coreg to 25 mg bid and losartan to 50 mg daily  Joh Rao Martinique MD, [facility]

## 2017-09-11 NOTE — Telephone Encounter (Signed)
Returned call to patient Dr.Jordan's recommendations given.Advised to continue to monitor B/P and call back if elevated.

## 2017-11-02 ENCOUNTER — Other Ambulatory Visit: Payer: Self-pay | Admitting: Cardiology

## 2017-11-05 ENCOUNTER — Telehealth: Payer: Self-pay | Admitting: Cardiology

## 2017-11-05 MED ORDER — LOSARTAN POTASSIUM 100 MG PO TABS: 100.0000 mg | ORAL_TABLET | Freq: Every day | ORAL | 30 refills | Status: DC

## 2017-11-05 NOTE — Telephone Encounter (Signed)
Please call,concerning his medicine,he said it had been some confusion about it.

## 2017-11-05 NOTE — Telephone Encounter (Signed)
Advised patient, verbalized understanding  

## 2017-11-05 NOTE — Telephone Encounter (Signed)
I would increase losartan to 100 mg daily.  Tell Rozelle Martinique MD, [facility]

## 2017-11-05 NOTE — Telephone Encounter (Signed)
Spoke with patient and he continues to have elevated blood pressure. Blood pressures running 130'-150's/80's. He is currently taking Amlodipine 5 mg daily, Carvedilol 25 mg twice a day, and Losartan 50 mg daily. He does need a refill sent to [facility] for Losartan 90 day supply. Will forward to Dr Martinique for review

## 2017-11-11 ENCOUNTER — Other Ambulatory Visit: Payer: Self-pay | Admitting: Cardiology

## 2017-12-04 ENCOUNTER — Other Ambulatory Visit: Payer: Self-pay | Admitting: Dermatology

## 2017-12-04 DIAGNOSIS — D485 Neoplasm of uncertain behavior of skin: Secondary | ICD-10-CM | POA: Diagnosis not present

## 2017-12-04 DIAGNOSIS — D229 Melanocytic nevi, unspecified: Secondary | ICD-10-CM | POA: Diagnosis not present

## 2017-12-04 DIAGNOSIS — L820 Inflamed seborrheic keratosis: Secondary | ICD-10-CM | POA: Diagnosis not present

## 2017-12-04 DIAGNOSIS — L409 Psoriasis, unspecified: Secondary | ICD-10-CM | POA: Diagnosis not present

## 2018-01-15 ENCOUNTER — Telehealth: Payer: Self-pay | Admitting: Cardiology

## 2018-01-15 MED ORDER — AMLODIPINE BESYLATE 5 MG PO TABS: ORAL_TABLET | ORAL | 6 refills | Status: DC

## 2018-01-15 NOTE — Telephone Encounter (Signed)
New message  Pt c/o medication issue:  1. Name of Medication: states its 2-3 medications  2. How are you currently taking this medication (dosage and times per day)? n/a  3. Are you having a reaction (difficulty breathing--STAT)? no  4. What is your medication issue? Trying different medications to get his bp under control

## 2018-01-15 NOTE — Telephone Encounter (Signed)
OK next I would increase amlodipine to 7.5 mg daily  Omario Ander Martinique MD, [facility]

## 2018-01-15 NOTE — Telephone Encounter (Signed)
Returned call to patient Dr.Jordan's recommendation given.Advised to continue to monitor B/P and call back if elevated.

## 2018-01-15 NOTE — Telephone Encounter (Signed)
Returned call to patient who states he told the operator that he wanted to talk to [place]. Informed him I will send her a message so she can call him

## 2018-01-15 NOTE — Telephone Encounter (Signed)
Returned call to patient.He stated his B/P continues to be elevated ranging 308 to 657 systolic, 85 to 95 diastolic.Pulse 80's.Losartan increased to 100 mg daily on 11/05/17.Coreg increased to 25 mg twice a day on 09/11/17.Message sent to [place] for advice.

## 2018-02-04 ENCOUNTER — Other Ambulatory Visit: Payer: Self-pay | Admitting: Cardiology

## 2018-02-04 NOTE — Telephone Encounter (Signed)
REFILL 

## 2018-02-09 NOTE — Progress Notes (Signed)
Office Visit    Patient Name: Paul [name] Date of Encounter: 02/11/2018  Primary Care Provider:  [name], MD Primary Cardiologist:  P. Martinique, MD   Chief Complaint    72 year old male with a history of CAD and CABG who is seen for follow up.  Past Medical History    Past Medical History:  Diagnosis Date  . Allergy   . Coronary artery disease    a. s/p CABG;  b. 08/2015 MV: inf/inflat infarct w/ new peri-infarct isch; c. LHC 11/16: pLAD 70, dLAD 50, D1 100, oRI 100, mLCx 60, OM2 99, pRCA 99 (L>R collats), S-RPDA 100, S-D1 ok, S-RI/OM2 mid 85 (PCI: Synergy DES), L-LAD ok, EF 55-65%.  . Depression   . GERD (gastroesophageal reflux disease)   . Hyperlipidemia   . Hypertensive heart disease   . Hypothyroidism   . Insomnia   . OSA (obstructive sleep apnea)    CPAP  . Prostate cancer ([place])   . Type II diabetes mellitus ([place])    Past Surgical History:  Procedure Laterality Date  . CARDIAC CATHETERIZATION N/A 09/19/2015   Procedure: Coronary Stent Intervention;  Surgeon: Smitty Ackerley M Martinique, MD;  Location: [facility];  Service: Cardiovascular;  Laterality: N/A;  . CARDIAC CATHETERIZATION  09/19/2015   Procedure: Left Heart Cath and Cors/Grafts Angiography;  Surgeon: Tamerra Merkley M Martinique, MD;  Location: [facility];  Service: Cardiovascular;;  . CHOLECYSTECTOMY    . CORONARY ARTERY BYPASS GRAFT    . JOINT REPLACEMENT     right knee  . LEFT HEART CATH AND CORS/GRAFTS ANGIOGRAPHY N/A 06/26/2017   Procedure: LEFT HEART CATH AND CORS/GRAFTS ANGIOGRAPHY;  Surgeon: Martinique, Aysha Livecchi M, MD;  Location: [facility];  Service: Cardiovascular;  Laterality: N/A;  . PROSTATE SURGERY      Allergies  Allergies  Allergen Reactions  . Brilinta [Ticagrelor] Shortness Of Breath  . Yellow Jacket Venom Anaphylaxis  . Morphine Other (See Comments)    Hallucinations/nightmares when taking for greater than 24hrs.  . Penicillins Other (See Comments)    "Eyes swell  shut" Has patient had a PCN reaction causing immediate rash, facial/tongue/throat swelling, SOB or lightheadedness with hypotension:Yes Has patient had a PCN reaction causing severe rash involving mucus membranes or skin necrosis:No Has patient had a PCN reaction that required hospitalization: No Has patient had a PCN reaction occurring within the last 10 years: No If all of the above answers are "NO", then may proceed with Cephalosporin use.   Marland Kitchen Percocet [Oxycodone-Acetaminophen]     Nightmares/hallucinations  . Septra Ds [Sulfamethoxazole-Trimethoprim]     rash  . Vicodin [Hydrocodone-Acetaminophen] Other (See Comments)    Hallucinations/nightmares  . Alprazolam Rash    History of Present Illness    72 year old male with the above past medical history including CAD status post coronary artery bypass grafting in 2005, hypertension, hyperlipidemia, diabetes, hypothyroidism, and obstructive sleep apnea.  In November 2016, he also reported chest tightness and  underwent stress testing which showed inferolateral infarct with  peri-infarct ischemia. This resulted in diagnostic catheterization being performed and revealing severe disease in the vein graft to the OM 2. The vein graft to the PDA was occluded and the distal right coronary artery filled via left-to-right collaterals. The vein graft to the OM 2 was felt to be the culprit and was successfully stented. Following the procedure, patient initially had some shortness of breath which ultimately was secondary to Brilinta and once he was switched to Plavix,  he felt significantly better.   On his last visit he was noted to have symptoms of dizziness felt to be related to his BP meds. His losartan was reduced. He states his symptoms continued until he stopped taking Jardiance and then his symptoms resolved. He is now on metformin and glimiperide.    More recently he was evaluated for proteinuria. Dr. Mercy Moore  felt this was related to HTN and DM.  BP was persistently high and Metoprolol and losartan doses were increased. He continued to  complain of exertional fatigue. He underwent repeat cardiac cath that showed patent grafts except known occlusion of SVG to RCA. No new disease to explain his symptoms. He was later switched from metoprolol to Coreg for better BP control.   Since his last visit his son was admitted with advanced heart failure and is being cared for by the heart failure team. He had noted continued increase in BP and medications were increased including Coreg, losartan, and amlodipine. On follow up today he notes BP is better. Still some fluctuation but general trend is improved. Planning to start exercising more.   Home Medications    Allergies as of 02/11/2018      Reactions   Brilinta [ticagrelor] Shortness Of Breath   Yellow Jacket Venom Anaphylaxis   Morphine Other (See Comments)   Hallucinations/nightmares when taking for greater than 24hrs.   Penicillins Other (See Comments)   "Eyes swell shut" Has patient had a PCN reaction causing immediate rash, facial/tongue/throat swelling, SOB or lightheadedness with hypotension:Yes Has patient had a PCN reaction causing severe rash involving mucus membranes or skin necrosis:No Has patient had a PCN reaction that required hospitalization: No Has patient had a PCN reaction occurring within the last 10 years: No If all of the above answers are "NO", then may proceed with Cephalosporin use.   Percocet [oxycodone-acetaminophen]    Nightmares/hallucinations   Septra Ds [sulfamethoxazole-trimethoprim]    rash   Vicodin [hydrocodone-acetaminophen] Other (See Comments)   Hallucinations/nightmares   Alprazolam Rash      Medication List        Accurate as of 02/11/18  3:57 PM. Always use your most recent med list.          amLODipine 5 MG tablet Commonly known as:  NORVASC Take 1&1/2 tablets ( 7.5 mg ) daily   aspirin EC 81 MG tablet Take 81 mg by mouth daily.    carvedilol 25 MG tablet Commonly known as:  COREG Take 25 mg by mouth 2 (two) times daily with a meal.   clindamycin 150 MG capsule Commonly known as:  CLEOCIN Take 300 mg by mouth See admin instructions. Take 2 capsules (300 mg) 1 hour before dental procedure.   colestipol 1 g tablet Commonly known as:  COLESTID Take 1 g by mouth at bedtime.   CoQ10 200 MG Caps Take 200 mg by mouth 2 (two) times daily.   DULoxetine 60 MG capsule Commonly known as:  CYMBALTA Take 60 mg by mouth daily with breakfast.   EPIPEN 2-PAK 0.3 mg/0.3 mL Soaj injection Generic drug:  EPINEPHrine Inject 0.3 mg into the muscle daily as needed (for anaphylatic allergic reaction.).   fluticasone 50 MCG/ACT nasal spray Commonly known as:  FLONASE Place 2 sprays into both nostrils daily as needed for allergies or rhinitis.   glimepiride 4 MG tablet Commonly known as:  AMARYL Take 4 mg by mouth daily with breakfast.   guaiFENesin 600 MG 12 hr tablet Commonly known as:  MUCINEX  Take 600-1,200 mg by mouth 2 (two) times daily as needed (for sinus drainage/cough/congestion).   levothyroxine 75 MCG tablet Commonly known as:  SYNTHROID, LEVOTHROID Take 75 mcg by mouth daily before breakfast.   liothyronine 5 MCG tablet Commonly known as:  CYTOMEL Take 5 mcg by mouth daily with breakfast.   loperamide 2 MG tablet Commonly known as:  IMODIUM A-D Take 2-4 mg by mouth 4 (four) times daily as needed for diarrhea or loose stools.   losartan 100 MG tablet Commonly known as:  COZAAR Take 1 tablet (100 mg total) by mouth daily.   metFORMIN 1000 MG tablet Commonly known as:  GLUCOPHAGE Take 1 tablet (1,000 mg total) by mouth 2 (two) times daily.   multivitamin tablet Take 1 tablet by mouth daily. Reported on 11/10/2015   nitroGLYCERIN 0.4 MG SL tablet Commonly known as:  NITROSTAT Place 1 tablet (0.4 mg total) under the tongue every 5 (five) minutes as needed for chest pain.   pantoprazole 20 MG  tablet Commonly known as:  PROTONIX Take 1 tablet (20 mg total) by mouth daily.   potassium citrate 10 MEQ (1080 MG) SR tablet Commonly known as:  UROCIT-K Take 10 mEq by mouth 2 (two) times daily.   simvastatin 20 MG tablet Commonly known as:  ZOCOR Take 1 tablet (20 mg total) by mouth at bedtime. KEEP OV.   triamcinolone cream 0.1 % Commonly known as:  KENALOG Apply 1 application topically as directed.   TYLENOL 8 HOUR ARTHRITIS PAIN 650 MG CR tablet Generic drug:  acetaminophen Take 1,300 mg by mouth every 8 (eight) hours as needed for pain.   vitamin B-12 250 MCG tablet Commonly known as:  CYANOCOBALAMIN Take 250 mcg by mouth daily.   Vitamin D 2000 units tablet Take 2,000 Units by mouth daily.        Review of Systems    As noted in HPI. All other systems reviewed and are otherwise negative except as noted above.  Physical Exam    VS:  BP 124/73   Pulse 81   Ht 5\' 9"  (1.753 m)   Wt 219 lb 9.6 oz (99.6 kg)   SpO2 97%   BMI 32.43 kg/m  , BMI Body mass index is 32.43 kg/m.  GENERAL:  Well appearing, overweight WM in NAD HEENT:  PERRL, EOMI, sclera are clear. Oropharynx is clear. NECK:  No jugular venous distention, carotid upstroke brisk and symmetric, no bruits, no thyromegaly or adenopathy LUNGS:  Clear to auscultation bilaterally CHEST:  Unremarkable HEART:  RRR,  PMI not displaced or sustained,S1 and S2 within normal limits, no S3, no S4: no clicks, no rubs, no murmurs ABD:  Soft, nontender. BS +, no masses or bruits. No hepatomegaly, no splenomegaly EXT:  2 + pulses throughout, no edema, no cyanosis no clubbing SKIN:  Warm and dry.  No rashes NEURO:  Alert and oriented x 3. Cranial nerves II through XII intact. PSYCH:  Cognitively intact     Accessory Clinical Findings    Lab Results  Component Value Date   WBC 5.9 06/21/2017   HGB 12.7 (L) 06/21/2017   HCT 38.2 06/21/2017   PLT 211 06/21/2017   GLUCOSE 93 06/21/2017   CHOL 152 08/05/2017    TRIG 355 (H) 08/05/2017   HDL 34 (L) 08/05/2017   LDLDIRECT 76.0 02/28/2015   LDLCALC 47 08/05/2017   ALT 17 08/05/2017   AST 21 08/05/2017   NA 141 06/21/2017   K 4.7 06/21/2017  CL 101 06/21/2017   CREATININE 1.29 (H) 06/21/2017   BUN 24 06/21/2017   CO2 22 06/21/2017   INR 1.0 06/21/2017   HGBA1C 7.3 (H) 02/28/2015   Labs dated 07/20/16: cholesterol 154, triglycerides 235, HDL 34, LDL 73.  Dated 08/27/16: CMET normal Dated 04/26/17: A1c 8%. Chemistries normal Dated 06/21/17: creatinine 1.29.  Dated 08/05/17: cholesterol 152, triglycerides 355, HDL 34, LDL 47. A1c 7.7%.   Procedures   LEFT HEART CATH AND CORS/GRAFTS ANGIOGRAPHY  Conclusion     Prox RCA to Dist RCA lesion, 100 %stenosed.  Prox LAD to Mid LAD lesion, 70 %stenosed.  Dist LAD lesion, 50 %stenosed.  1st Diag lesion, 100 %stenosed.  Ost Ramus lesion, 100 %stenosed.  2nd Mrg lesion, 99 %stenosed.  Mid Cx to Dist Cx lesion, 60 %stenosed.  SVG to RCA- Origin lesion, 100 %stenosed.  SVG sequential to ramus intermediate and OM is widely patent.  SVG to diagonal is patent.  LIMA and is normal in caliber.  LV end diastolic pressure is normal.   1. Severe 3 vessel obstructive CAD 2. Patent LIMA to the LAD 3. Patent SVG to ramus and OM branches sequentially. Stent is widely patent 4. Patent SVG to diagonal 5. SVG to RCA known to be occluded. 6. Normal LVEDP  Plan: no new disease to explain symptoms. Recommend continued medical therapy.      Assessment & Plan    1.  Exertional Fatigue. Based on prior evaluation there is a significant component of deconditioning.  Also related to hypertensive heart disease and OSA. Continue efforts at regular aerobic exercise with goal to improve his conditioning over the next 6 months. Needs to lose weight.   2. Coronary artery disease: Status post prior CABG in 2005 with PCI and DES placement to the vein graft to the second obtuse marginal in August 2016.  Catheterization at that time also revealed a patent LIMA to the LAD and vein graft to the diagonal. The vein graft to the PDA was occluded as was the native right coronary artery, though the distal right coronary was fed via left-to-right collaterals.   Recent cardiac cath showed no change in disease. He remains on aspirin,  statin, and beta blocker therapy.   3. Hypertensive heart disease: BP elevated more recently. Tolerating Coreg, ARB, and amlodipine. I am satisfied with BP control currently.    4. Hyperlipidemia: Continue statin therapy. Well controlled. Will repeat fasting lab in 6 months.  5. Proteinuria - followed by Ren[facility].  6. Carotid arterial disease with moderate LICA stenosis- follow up in 6 months.   Disa Riedlinger Martinique, MD,FACC 02/11/2018, 3:57 PM

## 2018-02-11 ENCOUNTER — Other Ambulatory Visit: Payer: Self-pay

## 2018-02-11 ENCOUNTER — Encounter: Payer: Self-pay | Admitting: Cardiology

## 2018-02-11 ENCOUNTER — Ambulatory Visit: Payer: PPO | Admitting: Cardiology

## 2018-02-11 VITALS — BP 124/73 | HR 81 | Ht 69.0 in | Wt 219.6 lb

## 2018-02-11 DIAGNOSIS — I25810 Atherosclerosis of coronary artery bypass graft(s) without angina pectoris: Secondary | ICD-10-CM

## 2018-02-11 DIAGNOSIS — E782 Mixed hyperlipidemia: Secondary | ICD-10-CM

## 2018-02-11 DIAGNOSIS — Z951 Presence of aortocoronary bypass graft: Secondary | ICD-10-CM | POA: Diagnosis not present

## 2018-02-11 DIAGNOSIS — G4733 Obstructive sleep apnea (adult) (pediatric): Secondary | ICD-10-CM | POA: Diagnosis not present

## 2018-02-11 DIAGNOSIS — I10 Essential (primary) hypertension: Secondary | ICD-10-CM | POA: Diagnosis not present

## 2018-02-11 DIAGNOSIS — I779 Disorder of arteries and arterioles, unspecified: Secondary | ICD-10-CM

## 2018-02-11 DIAGNOSIS — I739 Peripheral vascular disease, unspecified: Secondary | ICD-10-CM

## 2018-02-11 MED ORDER — AMLODIPINE BESYLATE 5 MG PO TABS: ORAL_TABLET | ORAL | 3 refills | Status: DC

## 2018-02-11 NOTE — Patient Instructions (Signed)
Continue your current therapy  Focus on lifestyle modification with diet, exercise and weight control  I will see you in 6 months.

## 2018-02-23 ENCOUNTER — Other Ambulatory Visit: Payer: Self-pay | Admitting: Cardiology

## 2018-02-28 ENCOUNTER — Telehealth: Payer: Self-pay | Admitting: Cardiology

## 2018-02-28 NOTE — Telephone Encounter (Signed)
Returned call to pt he stated that he will wait until Monday for [place] to return.

## 2018-02-28 NOTE — Telephone Encounter (Signed)
New Message:      Pt states he would only like to speak with Malachy Mood or Dr. Martinique about something the Dr. Asked him to do but it's not quite working out. Pt states he is okay with waiting for a call back on Monday.

## 2018-03-05 NOTE — Telephone Encounter (Signed)
Returned call to patient no answer.LMTC. 

## 2018-03-05 NOTE — Telephone Encounter (Signed)
New message ° °Pt verbalized that he is returning call for RN °

## 2018-03-05 NOTE — Telephone Encounter (Signed)
Routed to Dr. Martinique nurse as pt prefer to speak with her.

## 2018-03-05 NOTE — Telephone Encounter (Signed)
Returned call to patient.He stated he would like to see Dr.Jordan.He is having dizziness,light headed,breathless,tires easy.No chest pain.Stated he has had these symptoms for about 2 months.Stated he noticed symptoms worse since amlodipine and coreg increased.Appointment scheduled with Dr.Jordan tomorrow 5/9 at 3:40 pm.

## 2018-03-06 ENCOUNTER — Ambulatory Visit: Payer: PPO | Admitting: Cardiology

## 2018-03-06 ENCOUNTER — Encounter: Payer: Self-pay | Admitting: Cardiology

## 2018-03-06 VITALS — BP 120/68 | HR 81 | Ht 69.0 in | Wt 221.2 lb

## 2018-03-06 DIAGNOSIS — I25708 Atherosclerosis of coronary artery bypass graft(s), unspecified, with other forms of angina pectoris: Secondary | ICD-10-CM | POA: Diagnosis not present

## 2018-03-06 DIAGNOSIS — R0609 Other forms of dyspnea: Secondary | ICD-10-CM | POA: Diagnosis not present

## 2018-03-06 DIAGNOSIS — I10 Essential (primary) hypertension: Secondary | ICD-10-CM | POA: Diagnosis not present

## 2018-03-06 DIAGNOSIS — Z951 Presence of aortocoronary bypass graft: Secondary | ICD-10-CM

## 2018-03-06 DIAGNOSIS — R42 Dizziness and giddiness: Secondary | ICD-10-CM

## 2018-03-06 NOTE — Progress Notes (Signed)
Office Visit    Patient Name: Paul [name] [facility] Date of Encounter: 03/06/2018  Primary Care Provider:  [name], MD Primary Cardiologist:  P. Martinique, MD   Chief Complaint    72 year old male with a history of CAD and CABG who is seen evaluation of dizziness and DOE.  Past Medical History    Past Medical History:  Diagnosis Date  . Allergy   . Coronary artery disease    a. s/p CABG;  b. 08/2015 MV: inf/inflat infarct w/ new peri-infarct isch; c. LHC 11/16: pLAD 70, dLAD 50, D1 100, oRI 100, mLCx 60, OM2 99, pRCA 99 (L>R collats), S-RPDA 100, S-D1 ok, S-RI/OM2 mid 85 (PCI: Synergy DES), L-LAD ok, EF 55-65%.  . Depression   . GERD (gastroesophageal reflux disease)   . Hyperlipidemia   . Hypertensive heart disease   . Hypothyroidism   . Insomnia   . OSA (obstructive sleep apnea)    CPAP  . Prostate cancer ([place])   . Type II diabetes mellitus ([place])    Past Surgical History:  Procedure Laterality Date  . CARDIAC CATHETERIZATION N/A 09/19/2015   Procedure: Coronary Stent Intervention;  Surgeon: Peter M Martinique, MD;  Location: [place] [facility];  Service: Cardiovascular;  Laterality: N/A;  . CARDIAC CATHETERIZATION  09/19/2015   Procedure: Left Heart Cath and Cors/Grafts Angiography;  Surgeon: Peter M Martinique, MD;  Location: [place] [facility];  Service: Cardiovascular;;  . CHOLECYSTECTOMY    . CORONARY ARTERY BYPASS GRAFT    . JOINT REPLACEMENT     right knee  . LEFT HEART CATH AND CORS/GRAFTS ANGIOGRAPHY N/A 06/26/2017   Procedure: LEFT HEART CATH AND CORS/GRAFTS ANGIOGRAPHY;  Surgeon: Martinique, Peter M, MD;  Location: [place] [facility];  Service: Cardiovascular;  Laterality: N/A;  . PROSTATE SURGERY      Allergies  Allergies  Allergen Reactions  . Brilinta [Ticagrelor] Shortness Of Breath  . Yellow Jacket Venom Anaphylaxis  . Morphine Other (See Comments)    Hallucinations/nightmares when taking for greater than 24hrs.  . Penicillins Other (See Comments)     "Eyes swell shut" Has patient had a PCN reaction causing immediate rash, facial/tongue/throat swelling, SOB or lightheadedness with hypotension:Yes Has patient had a PCN reaction causing severe rash involving mucus membranes or skin necrosis:No Has patient had a PCN reaction that required hospitalization: No Has patient had a PCN reaction occurring within the last 10 years: No If all of the above answers are "NO", then may proceed with Cephalosporin use.   Marland Kitchen Percocet [Oxycodone-Acetaminophen]     Nightmares/hallucinations  . Septra Ds [Sulfamethoxazole-Trimethoprim]     rash  . Vicodin [Hydrocodone-Acetaminophen] Other (See Comments)    Hallucinations/nightmares  . Alprazolam Rash    History of Present Illness    Mr. Chestnut is seen for complaints of lightheadedness. He has a history including CAD status post coronary artery bypass grafting in 2005, hypertension, hyperlipidemia, diabetes, hypothyroidism, and obstructive sleep apnea.  In November 2016, he also reported chest tightness and  underwent stress testing which showed inferolateral infarct with  peri-infarct ischemia. This resulted in diagnostic catheterization being performed and revealing severe disease in the vein graft to the OM 2. The vein graft to the PDA was occluded and the distal right coronary artery filled via left-to-right collaterals. The vein graft to the OM 2 was felt to be the culprit and was successfully stented. Following the procedure, patient initially had some shortness of breath which ultimately was secondary to [facility] and  once he was switched to Plavix, he felt significantly better.   On prior visits he was noted to have symptoms of dizziness felt to be related to his BP meds. His losartan was reduced. He states his symptoms continued until he stopped taking Jardiance and then his symptoms resolved. He is now on metformin and glimiperide.    More recently BP was persistently high and Metoprolol and losartan  doses were increased. He continued to  complain of exertional fatigue. He underwent repeat cardiac cath in August 2018  that showed patent grafts except known occlusion of SVG to RCA. No new disease to explain his symptoms. He was later switched from metoprolol to Coreg for better BP control. He was seen last month and he BP was improved on higher doses of medication.  Now he notes that he is dizzy, tired, and SOB. Finds he has to stop with activity to rest and can't do much. No chest pain. BP has been well controlled.   Home Medications    Allergies as of 03/06/2018      Reactions   Brilinta [ticagrelor] Shortness Of Breath   Yellow Jacket Venom Anaphylaxis   Morphine Other (See Comments)   Hallucinations/nightmares when taking for greater than 24hrs.   Penicillins Other (See Comments)   "Eyes swell shut" Has patient had a PCN reaction causing immediate rash, facial/tongue/throat swelling, SOB or lightheadedness with hypotension:Yes Has patient had a PCN reaction causing severe rash involving mucus membranes or skin necrosis:No Has patient had a PCN reaction that required hospitalization: No Has patient had a PCN reaction occurring within the last 10 years: No If all of the above answers are "NO", then may proceed with Cephalosporin use.   Percocet [oxycodone-acetaminophen]    Nightmares/hallucinations   Septra Ds [sulfamethoxazole-trimethoprim]    rash   Vicodin [hydrocodone-acetaminophen] Other (See Comments)   Hallucinations/nightmares   Alprazolam Rash      Medication List        Accurate as of 03/06/18  4:11 PM. Always use your most recent med list.          amLODipine 5 MG tablet Commonly known as:  NORVASC Take 1&1/2 tablets ( 7.5 mg ) daily   aspirin EC 81 MG tablet Take 81 mg by mouth daily.   carvedilol 25 MG tablet Commonly known as:  COREG Take 12.5 mg by mouth 2 (two) times daily with a meal.   clindamycin 150 MG capsule Commonly known as:  CLEOCIN Take 300  mg by mouth See admin instructions. Take 2 capsules (300 mg) 1 hour before dental procedure.   colestipol 1 g tablet Commonly known as:  COLESTID Take 1 g by mouth at bedtime.   CoQ10 200 MG Caps Take 200 mg by mouth 2 (two) times daily.   DULoxetine 60 MG capsule Commonly known as:  CYMBALTA Take 60 mg by mouth daily with breakfast.   EPIPEN 2-PAK 0.3 mg/0.3 mL Soaj injection Generic drug:  EPINEPHrine Inject 0.3 mg into the muscle daily as needed (for anaphylatic allergic reaction.).   fluticasone 50 MCG/ACT nasal spray Commonly known as:  FLONASE Place 2 sprays into both nostrils daily as needed for allergies or rhinitis.   glimepiride 4 MG tablet Commonly known as:  AMARYL Take 4 mg by mouth daily with breakfast.   guaiFENesin 600 MG 12 hr tablet Commonly known as:  MUCINEX Take 600-1,200 mg by mouth 2 (two) times daily as needed (for sinus drainage/cough/congestion).   levothyroxine 75 MCG tablet Commonly known  as:  SYNTHROID, LEVOTHROID Take 75 mcg by mouth daily before breakfast.   liothyronine 5 MCG tablet Commonly known as:  CYTOMEL Take 5 mcg by mouth daily with breakfast.   loperamide 2 MG tablet Commonly known as:  IMODIUM A-D Take 2-4 mg by mouth 4 (four) times daily as needed for diarrhea or loose stools.   losartan 100 MG tablet Commonly known as:  COZAAR Take 1 tablet (100 mg total) by mouth daily.   metFORMIN 1000 MG tablet Commonly known as:  GLUCOPHAGE Take 1 tablet (1,000 mg total) by mouth 2 (two) times daily.   multivitamin tablet Take 1 tablet by mouth daily. Reported on 11/10/2015   nitroGLYCERIN 0.4 MG SL tablet Commonly known as:  NITROSTAT DISSOLVE ONE TABLET UNDER THE TONGUE EVERY 5 MINUTES AS NEEDED FOR CHEST PAIN.   pantoprazole 20 MG tablet Commonly known as:  PROTONIX Take 1 tablet (20 mg total) by mouth daily.   potassium citrate 10 MEQ (1080 MG) SR tablet Commonly known as:  UROCIT-K Take 10 mEq by mouth 2 (two) times  daily.   simvastatin 20 MG tablet Commonly known as:  ZOCOR Take 1 tablet (20 mg total) by mouth at bedtime. KEEP OV.   triamcinolone cream 0.1 % Commonly known as:  KENALOG Apply 1 application topically as directed.   TYLENOL 8 HOUR ARTHRITIS PAIN 650 MG CR tablet Generic drug:  acetaminophen Take 1,300 mg by mouth every 8 (eight) hours as needed for pain.   vitamin B-12 250 MCG tablet Commonly known as:  CYANOCOBALAMIN Take 250 mcg by mouth daily.   Vitamin D 2000 units tablet Take 2,000 Units by mouth daily.        Review of Systems    As noted in HPI. All other systems reviewed and are otherwise negative except as noted above.  Physical Exam    VS:  BP 120/68   Pulse 81   Ht 5\' 9"  (1.753 m)   Wt 221 lb 3.2 oz (100.3 kg)   BMI 32.67 kg/m  , BMI Body mass index is 32.67 kg/m.  GENERAL:  Well appearing, overweight WM in NAD HEENT:  PERRL, EOMI, sclera are clear. Oropharynx is clear. NECK:  No jugular venous distention, carotid upstroke brisk and symmetric, no bruits, no thyromegaly or adenopathy LUNGS:  Clear to auscultation bilaterally CHEST:  Unremarkable HEART:  RRR,  PMI not displaced or sustained,S1 and S2 within normal limits, no S3, no S4: no clicks, no rubs, no murmurs ABD:  Soft, nontender. BS +, no masses or bruits. No hepatomegaly, no splenomegaly EXT:  2 + pulses throughout, no edema, no cyanosis no clubbing SKIN:  Warm and dry.  No rashes NEURO:  Alert and oriented x 3. Cranial nerves II through XII intact. PSYCH:  Cognitively intact     Accessory Clinical Findings    Lab Results  Component Value Date   WBC 5.9 06/21/2017   HGB 12.7 (L) 06/21/2017   HCT 38.2 06/21/2017   PLT 211 06/21/2017   GLUCOSE 93 06/21/2017   CHOL 152 08/05/2017   TRIG 355 (H) 08/05/2017   HDL 34 (L) 08/05/2017   LDLDIRECT 76.0 02/28/2015   LDLCALC 47 08/05/2017   ALT 17 08/05/2017   AST 21 08/05/2017   NA 141 06/21/2017   K 4.7 06/21/2017   CL 101 06/21/2017     CREATININE 1.29 (H) 06/21/2017   BUN 24 06/21/2017   CO2 22 06/21/2017   INR 1.0 06/21/2017   HGBA1C 7.3 (H)  02/28/2015   Labs dated 07/20/16: cholesterol 154, triglycerides 235, HDL 34, LDL 73.  Dated 08/27/16: CMET normal Dated 04/26/17: A1c 8%. Chemistries normal Dated 06/21/17: creatinine 1.29.  Dated 08/05/17: cholesterol 152, triglycerides 355, HDL 34, LDL 47. A1c 7.7%.   Ecg today shows NSR rate 81. Normal Ecg. I have personally reviewed and interpreted this study.   Procedures   LEFT HEART CATH AND CORS/GRAFTS ANGIOGRAPHY  Conclusion     Prox RCA to Dist RCA lesion, 100 %stenosed.  Prox LAD to Mid LAD lesion, 70 %stenosed.  Dist LAD lesion, 50 %stenosed.  1st Diag lesion, 100 %stenosed.  Ost Ramus lesion, 100 %stenosed.  2nd Mrg lesion, 99 %stenosed.  Mid Cx to Dist Cx lesion, 60 %stenosed.  SVG to RCA- Origin lesion, 100 %stenosed.  SVG sequential to ramus intermediate and OM is widely patent.  SVG to diagonal is patent.  LIMA and is normal in caliber.  LV end diastolic pressure is normal.   1. Severe 3 vessel obstructive CAD 2. Patent LIMA to the LAD 3. Patent SVG to ramus and OM branches sequentially. Stent is widely patent 4. Patent SVG to diagonal 5. SVG to RCA known to be occluded. 6. Normal LVEDP  Plan: no new disease to explain symptoms. Recommend continued medical therapy.      Assessment & Plan    1.  Exertional Fatigue/dizziness/dyspnea. He may be having side effects from BP medication. Since BP improved will reduce Coreg to 12.5 mg bid. If symptoms persist may need to reduce losartan as well as long as BP controlled.   Continue efforts at regular aerobic exercise with goal to improve his conditioning.  2. Coronary artery disease: Status post prior CABG in 2005 with PCI and DES placement to the vein graft to the second obtuse marginal in August 2016. Catheterization at that time also revealed a patent LIMA to the LAD and vein graft to  the diagonal. The vein graft to the PDA was occluded as was the native right coronary artery, though the distal right coronary was fed via left-to-right collaterals.   Recent cardiac cath in August 2018 showed no change in disease. He remains on aspirin,  statin, and beta blocker therapy.   3. Hypertensive heart disease: BP control has improved. Will adjust Coreg dose down as noted above.   4. Hyperlipidemia: Continue statin therapy. Well controlled. Will repeat fasting lab in 6 months.  5. Proteinuria - followed by Renal.  6. Carotid arterial disease with moderate LICA stenosis- follow up in 6 months.   Peter Martinique, MD,FACC 03/06/2018, 4:11 PM

## 2018-03-06 NOTE — Patient Instructions (Signed)
Reduce Coreg to 12.5 mg twice a day  Continue your other therapy  Let me know how this goes

## 2018-03-13 DIAGNOSIS — J019 Acute sinusitis, unspecified: Secondary | ICD-10-CM | POA: Diagnosis not present

## 2018-03-13 DIAGNOSIS — I2510 Atherosclerotic heart disease of native coronary artery without angina pectoris: Secondary | ICD-10-CM | POA: Diagnosis not present

## 2018-03-13 DIAGNOSIS — E785 Hyperlipidemia, unspecified: Secondary | ICD-10-CM | POA: Diagnosis not present

## 2018-03-13 DIAGNOSIS — E669 Obesity, unspecified: Secondary | ICD-10-CM | POA: Diagnosis not present

## 2018-03-13 DIAGNOSIS — N183 Chronic kidney disease, stage 3 (moderate): Secondary | ICD-10-CM | POA: Diagnosis not present

## 2018-03-13 DIAGNOSIS — Z8546 Personal history of malignant neoplasm of prostate: Secondary | ICD-10-CM | POA: Diagnosis not present

## 2018-03-13 DIAGNOSIS — K219 Gastro-esophageal reflux disease without esophagitis: Secondary | ICD-10-CM | POA: Diagnosis not present

## 2018-03-13 DIAGNOSIS — F325 Major depressive disorder, single episode, in full remission: Secondary | ICD-10-CM | POA: Diagnosis not present

## 2018-03-13 DIAGNOSIS — I10 Essential (primary) hypertension: Secondary | ICD-10-CM | POA: Diagnosis not present

## 2018-03-13 DIAGNOSIS — E039 Hypothyroidism, unspecified: Secondary | ICD-10-CM | POA: Diagnosis not present

## 2018-03-13 DIAGNOSIS — G4733 Obstructive sleep apnea (adult) (pediatric): Secondary | ICD-10-CM | POA: Diagnosis not present

## 2018-03-13 DIAGNOSIS — E1122 Type 2 diabetes mellitus with diabetic chronic kidney disease: Secondary | ICD-10-CM | POA: Diagnosis not present

## 2018-04-14 DIAGNOSIS — E669 Obesity, unspecified: Secondary | ICD-10-CM | POA: Diagnosis not present

## 2018-04-14 DIAGNOSIS — I10 Essential (primary) hypertension: Secondary | ICD-10-CM | POA: Diagnosis not present

## 2018-04-14 DIAGNOSIS — R809 Proteinuria, unspecified: Secondary | ICD-10-CM | POA: Diagnosis not present

## 2018-04-14 DIAGNOSIS — R5383 Other fatigue: Secondary | ICD-10-CM | POA: Diagnosis not present

## 2018-04-18 ENCOUNTER — Telehealth: Payer: Self-pay | Admitting: Cardiology

## 2018-04-18 NOTE — Telephone Encounter (Signed)
Received records from [facility] on 04/18/18, Appt 06/27/18 @ 11:40AM. [place]

## 2018-05-12 DIAGNOSIS — G4733 Obstructive sleep apnea (adult) (pediatric): Secondary | ICD-10-CM | POA: Diagnosis not present

## 2018-05-16 ENCOUNTER — Other Ambulatory Visit: Payer: Self-pay | Admitting: Cardiology

## 2018-05-16 DIAGNOSIS — I6523 Occlusion and stenosis of bilateral carotid arteries: Secondary | ICD-10-CM

## 2018-06-22 NOTE — Progress Notes (Signed)
Office Visit    Patient Name: Paul [name] [facility] Date of Encounter: 06/27/2018  Primary Care Provider:  [name], [name] Primary Cardiologist:  P. Martinique, [name]   Chief Complaint    72 year old male with a history of CAD and CABG who is seen for follow  Up.  Past Medical History    Past Medical History:  Diagnosis Date  . Allergy   . Coronary artery disease    a. s/p CABG;  b. 08/2015 MV: inf/inflat infarct w/ new peri-infarct isch; c. LHC 11/16: pLAD 70, dLAD 50, D1 100, oRI 100, mLCx 60, OM2 99, pRCA 99 (L>R collats), S-RPDA 100, S-D1 ok, S-RI/OM2 mid 85 (PCI: Synergy DES), L-LAD ok, EF 55-65%.  . Depression   . GERD (gastroesophageal reflux disease)   . Hyperlipidemia   . Hypertensive heart disease   . Hypothyroidism   . Insomnia   . OSA (obstructive sleep apnea)    CPAP  . Prostate cancer ([place])   . Type II diabetes mellitus ([place])    Past Surgical History:  Procedure Laterality Date  . CARDIAC CATHETERIZATION N/A 09/19/2015   Procedure: Coronary Stent Intervention;  Surgeon: Paul Wherley M Martinique, [name];  Location: [place] [facility];  Service: Cardiovascular;  Laterality: N/A;  . CARDIAC CATHETERIZATION  09/19/2015   Procedure: Left Heart Cath and Cors/Grafts Angiography;  Surgeon: Paul Boivin M Martinique, [name];  Location: [place] [facility];  Service: Cardiovascular;;  . CHOLECYSTECTOMY    . CORONARY ARTERY BYPASS GRAFT    . JOINT REPLACEMENT     right knee  . LEFT HEART CATH AND CORS/GRAFTS ANGIOGRAPHY N/A 06/26/2017   Procedure: LEFT HEART CATH AND CORS/GRAFTS ANGIOGRAPHY;  Surgeon: [name], Paul [name];  Location: [place] [facility];  Service: Cardiovascular;  Laterality: N/A;  . PROSTATE SURGERY      Allergies  Allergies  Allergen Reactions  . Brilinta [Ticagrelor] Shortness Of Breath  . Yellow Jacket Venom Anaphylaxis  . Morphine Other (See Comments)    Hallucinations/nightmares when taking for greater than 24hrs.  . Penicillins Other (See Comments)    "Eyes swell  shut" Has patient had a PCN reaction causing immediate rash, facial/tongue/throat swelling, SOB or lightheadedness with hypotension:Yes Has patient had a PCN reaction causing severe rash involving mucus membranes or skin necrosis:No Has patient had a PCN reaction that required hospitalization: No Has patient had a PCN reaction occurring within the last 10 years: No If all of the above answers are "NO", then may proceed with Cephalosporin use.   Marland Kitchen Percocet [Oxycodone-Acetaminophen]     Nightmares/hallucinations  . Septra Ds [Sulfamethoxazole-Trimethoprim]     rash  . Vicodin [Hydrocodone-Acetaminophen] Other (See Comments)    Hallucinations/nightmares  . Alprazolam Rash    History of Present Illness    Mr. Paul [name] is seen for complaints of lightheadedness. He has a history including CAD status post coronary artery bypass grafting in 2005, hypertension, hyperlipidemia, diabetes, hypothyroidism, and obstructive sleep apnea.  In November 2016, he also reported chest tightness and  underwent stress testing which showed inferolateral infarct with  peri-infarct ischemia. This resulted in diagnostic catheterization being performed and revealing severe disease in the vein graft to the OM 2. The vein graft to the PDA was occluded and the distal right coronary artery filled via left-to-right collaterals. The vein graft to the OM 2 was felt to be the culprit and was successfully stented. Following the procedure, patient initially had some shortness of breath which ultimately was secondary to Brilinta and once  he was switched to Plavix, he felt significantly better.   On prior visits he was noted to have symptoms of dizziness felt to be related to his BP meds. His losartan was reduced. He states his symptoms continued until he stopped taking Jardiance and then his symptoms resolved. He is now on metformin and glimiperide.    Last year BP was persistently high and Metoprolol and losartan doses were  increased. He continued to  complain of exertional fatigue. He underwent repeat cardiac cath in August 2018  that showed patent grafts except known occlusion of SVG to RCA. No new disease to explain his symptoms. He was later switched from metoprolol to Coreg for better BP control. BP improved and Coreg dose was reduced due to persistent symptoms of dizziness and fatigue.   On follow up today he states this hasn't really helped. He still experiencing symptoms of lightheadedness, dizziness, and dyspnea with activity. Was seen by Dr. Moshe [name] with Nephrology and was still noted to have some orthostasis. Losartan was switched to taking in pm. Really no change in BP medications has made much impact with his symptoms.     Home Medications    Allergies as of 06/27/2018      Reactions   Brilinta [ticagrelor] Shortness Of Breath   Yellow Jacket Venom Anaphylaxis   Morphine Other (See Comments)   Hallucinations/nightmares when taking for greater than 24hrs.   Penicillins Other (See Comments)   "Eyes swell shut" Has patient had a PCN reaction causing immediate rash, facial/tongue/throat swelling, SOB or lightheadedness with hypotension:Yes Has patient had a PCN reaction causing severe rash involving mucus membranes or skin necrosis:No Has patient had a PCN reaction that required hospitalization: No Has patient had a PCN reaction occurring within the last 10 years: No If all of the above answers are "NO", then may proceed with Cephalosporin use.   Percocet [oxycodone-acetaminophen]    Nightmares/hallucinations   Septra Ds [sulfamethoxazole-trimethoprim]    rash   Vicodin [hydrocodone-acetaminophen] Other (See Comments)   Hallucinations/nightmares   Alprazolam Rash      Medication List        Accurate as of 06/27/18 12:00 PM. Always use your most recent med list.          amLODipine 5 MG tablet Commonly known as:  NORVASC Take 1&1/2 tablets ( 7.5 mg ) daily   aspirin EC 81 MG  tablet Take 81 mg by mouth daily.   carvedilol 12.5 MG tablet Commonly known as:  COREG   clindamycin 150 MG capsule Commonly known as:  CLEOCIN Take 300 mg by mouth See admin instructions. Take 2 capsules (300 mg) 1 hour before dental procedure.   colestipol 1 g tablet Commonly known as:  COLESTID Take 1 g by mouth at bedtime.   CoQ10 200 MG Caps Take 200 mg by mouth 2 (two) times daily.   doxycycline 100 MG capsule Commonly known as:  VIBRAMYCIN Take 100 mg by mouth 2 (two) times daily.   DULoxetine 60 MG capsule Commonly known as:  CYMBALTA Take 60 mg by mouth daily with breakfast.   EPIPEN 2-PAK 0.3 mg/0.3 mL Soaj injection Generic drug:  EPINEPHrine Inject 0.3 mg into the muscle daily as needed (for anaphylatic allergic reaction.).   fluticasone 50 MCG/ACT nasal spray Commonly known as:  FLONASE Place 2 sprays into both nostrils daily as needed for allergies or rhinitis.   glimepiride 4 MG tablet Commonly known as:  AMARYL Take 4 mg by mouth daily with breakfast.  guaiFENesin 600 MG 12 hr tablet Commonly known as:  MUCINEX Take 600-1,200 mg by mouth 2 (two) times daily as needed (for sinus drainage/cough/congestion).   levothyroxine 75 MCG tablet Commonly known as:  SYNTHROID, LEVOTHROID Take 75 mcg by mouth daily before breakfast.   liothyronine 5 MCG tablet Commonly known as:  CYTOMEL Take 5 mcg by mouth daily with breakfast.   loperamide 2 MG tablet Commonly known as:  IMODIUM A-D Take 2-4 mg by mouth 4 (four) times daily as needed for diarrhea or loose stools.   losartan 100 MG tablet Commonly known as:  COZAAR Take 1 tablet (100 mg total) by mouth daily.   metFORMIN 1000 MG tablet Commonly known as:  GLUCOPHAGE Take 1 tablet (1,000 mg total) by mouth 2 (two) times daily.   multivitamin tablet Take 1 tablet by mouth daily. Reported on 11/10/2015   nitroGLYCERIN 0.4 MG SL tablet Commonly known as:  NITROSTAT DISSOLVE ONE TABLET UNDER THE  TONGUE EVERY 5 MINUTES AS NEEDED FOR CHEST PAIN.   pantoprazole 20 MG tablet Commonly known as:  PROTONIX Take 1 tablet (20 mg total) by mouth daily.   potassium citrate 10 MEQ (1080 MG) SR tablet Commonly known as:  UROCIT-K Take 10 mEq by mouth 2 (two) times daily.   simvastatin 20 MG tablet Commonly known as:  ZOCOR Take 1 tablet (20 mg total) by mouth at bedtime. KEEP OV.   triamcinolone cream 0.1 % Commonly known as:  KENALOG Apply 1 application topically as directed.   TYLENOL 8 HOUR ARTHRITIS PAIN 650 MG CR tablet Generic drug:  acetaminophen Take 1,300 mg by mouth every 8 (eight) hours as needed for pain.   vitamin B-12 250 MCG tablet Commonly known as:  CYANOCOBALAMIN Take 250 mcg by mouth daily.   Vitamin D 2000 units tablet Take 2,000 Units by mouth daily.        Review of Systems    As noted in HPI. All other systems reviewed and are otherwise negative except as noted above.  Physical Exam    VS:  BP 134/78   Pulse 88   Ht 5\' 9"  (1.753 m)   Wt 216 lb 3.2 oz (98.1 kg)   BMI 31.93 kg/m  , BMI Body mass index is 31.93 kg/m.  GENERAL:  Well appearing, overweight WM in NAD HEENT:  PERRL, EOMI, sclera are clear. Oropharynx is clear. NECK:  No jugular venous distention, carotid upstroke brisk and symmetric, no bruits, no thyromegaly or adenopathy LUNGS:  Clear to auscultation bilaterally CHEST:  Unremarkable HEART:  RRR,  PMI not displaced or sustained,S1 and S2 within normal limits, no S3, no S4: no clicks, no rubs, no murmurs ABD:  Soft, nontender. BS +, no masses or bruits. No hepatomegaly, no splenomegaly EXT:  2 + pulses throughout, no edema, no cyanosis no clubbing SKIN:  Warm and dry.  No rashes NEURO:  Alert and oriented x 3. Cranial nerves II through XII intact. PSYCH:  Cognitively intact     Accessory Clinical Findings    Lab Results  Component Value Date   WBC 5.9 06/21/2017   HGB 12.7 (L) 06/21/2017   HCT 38.2 06/21/2017   PLT 211  06/21/2017   GLUCOSE 162 (H) 06/24/2018   CHOL 162 06/24/2018   TRIG 211 (H) 06/24/2018   HDL 32 (L) 06/24/2018   LDLDIRECT 76.0 02/28/2015   LDLCALC 88 06/24/2018   ALT 27 06/24/2018   AST 23 06/24/2018   NA 139 06/24/2018   K  4.3 06/24/2018   CL 100 06/24/2018   CREATININE 1.46 (H) 06/24/2018   BUN 23 06/24/2018   CO2 20 06/24/2018   INR 1.0 06/21/2017   HGBA1C 7.3 (H) 02/28/2015   Labs dated 07/20/16: cholesterol 154, triglycerides 235, HDL 34, LDL 73.  Dated 08/27/16: CMET normal Dated 04/26/17: A1c 8%. Chemistries normal Dated 06/21/17: creatinine 1.29.  Dated 08/05/17: cholesterol 152, triglycerides 355, HDL 34, LDL 47. A1c 7.7%.    Procedures   LEFT HEART CATH AND CORS/GRAFTS ANGIOGRAPHY  Conclusion     Prox RCA to Dist RCA lesion, 100 %stenosed.  Prox LAD to Mid LAD lesion, 70 %stenosed.  Dist LAD lesion, 50 %stenosed.  1st Diag lesion, 100 %stenosed.  Ost Ramus lesion, 100 %stenosed.  2nd Mrg lesion, 99 %stenosed.  Mid Cx to Dist Cx lesion, 60 %stenosed.  SVG to RCA- Origin lesion, 100 %stenosed.  SVG sequential to ramus intermediate and OM is widely patent.  SVG to diagonal is patent.  LIMA and is normal in caliber.  LV end diastolic pressure is normal.   1. Severe 3 vessel obstructive CAD 2. Patent LIMA to the LAD 3. Patent SVG to ramus and OM branches sequentially. Stent is widely patent 4. Patent SVG to diagonal 5. SVG to RCA known to be occluded. 6. Normal LVEDP  Plan: no new disease to explain symptoms. Recommend continued medical therapy.      Assessment & Plan    1.  Exertional Fatigue/dizziness/dyspnea. Some of his dizziness is related to orthostasis but not at other times. Still has limiting dyspnea of unclear etiology.  Multiple changes in medication really hasn't impacted this. Will plan to evaluate further with an Echo and Cardiopulmonary stress testing. If this demonstrates more of a pulmonary component he will need formal  pulmonary evaluation. I think there is probably a major component of deconditioning and stressors at home may be playing a role.   Continue efforts at regular aerobic exercise with goal to improve his conditioning.  2. Coronary artery disease: Status post prior CABG in 2005 with PCI and DES placement to the vein graft to the second obtuse marginal in August 2016. Catheterization at that time also revealed a patent LIMA to the LAD and vein graft to the diagonal. The vein graft to the PDA was occluded as was the native right coronary artery, though the distal right coronary was fed via left-to-right collaterals.  Not a candidate for CTO PCI.  Recent cardiac cath in August 2018 showed no change in disease. He remains on aspirin,  statin, and beta blocker therapy.   3. Hypertensive heart disease: BP control has improved.  Still has some elevated BP readings at times but will not increase therapy due to some orthostatic symptoms.   4. Hyperlipidemia: Continue statin therapy. Under fair control.   5. Proteinuria - followed by Renal.  6. Carotid arterial disease with moderate LICA stenosis- follow up dopplers scheduled in October   Zvi Duplantis [name], [place] 06/27/2018, 12:00 PM

## 2018-06-24 DIAGNOSIS — E782 Mixed hyperlipidemia: Secondary | ICD-10-CM | POA: Diagnosis not present

## 2018-06-24 DIAGNOSIS — I779 Disorder of arteries and arterioles, unspecified: Secondary | ICD-10-CM | POA: Diagnosis not present

## 2018-06-24 DIAGNOSIS — I10 Essential (primary) hypertension: Secondary | ICD-10-CM | POA: Diagnosis not present

## 2018-06-24 DIAGNOSIS — Z951 Presence of aortocoronary bypass graft: Secondary | ICD-10-CM | POA: Diagnosis not present

## 2018-06-24 DIAGNOSIS — I25810 Atherosclerosis of coronary artery bypass graft(s) without angina pectoris: Secondary | ICD-10-CM | POA: Diagnosis not present

## 2018-06-24 DIAGNOSIS — G4733 Obstructive sleep apnea (adult) (pediatric): Secondary | ICD-10-CM | POA: Diagnosis not present

## 2018-06-24 LAB — BASIC METABOLIC PANEL
BUN/Creatinine Ratio: 16 (ref 10–24)
BUN: 23 mg/dL (ref 8–27)
CALCIUM: 9.6 mg/dL (ref 8.6–10.2)
CO2: 20 mmol/L (ref 20–29)
CREATININE: 1.46 mg/dL — AB (ref 0.76–1.27)
Chloride: 100 mmol/L (ref 96–106)
GFR calc Af Amer: 55 mL/min/{1.73_m2} — ABNORMAL LOW (ref >59)
GFR calc non Af Amer: 47 mL/min/{1.73_m2} — ABNORMAL LOW (ref >59)
Glucose: 162 mg/dL — ABNORMAL HIGH (ref 65–99)
Potassium: 4.3 mmol/L (ref 3.5–5.2)
Sodium: 139 mmol/L (ref 134–144)

## 2018-06-24 LAB — LIPID PANEL W/O CHOL/HDL RATIO
Cholesterol, Total: 162 mg/dL (ref 100–199)
HDL: 32 mg/dL — AB (ref >39)
LDL Calculated: 88 mg/dL (ref 0–99)
TRIGLYCERIDES: 211 mg/dL — AB (ref 0–149)
VLDL Cholesterol Cal: 42 mg/dL — ABNORMAL HIGH (ref 5–40)

## 2018-06-24 LAB — HEPATIC FUNCTION PANEL
ALK PHOS: 42 IU/L (ref 39–117)
ALT: 27 IU/L (ref 0–44)
AST: 23 IU/L (ref 0–40)
Albumin: 4.1 g/dL (ref 3.5–4.8)
Bilirubin Total: 0.5 mg/dL (ref 0.0–1.2)
Bilirubin, Direct: 0.14 mg/dL (ref 0.00–0.40)
TOTAL PROTEIN: 6.6 g/dL (ref 6.0–8.5)

## 2018-06-27 ENCOUNTER — Ambulatory Visit: Payer: PPO | Admitting: Cardiology

## 2018-06-27 ENCOUNTER — Encounter: Payer: Self-pay | Admitting: Cardiology

## 2018-06-27 VITALS — BP 134/78 | HR 88 | Ht 69.0 in | Wt 216.2 lb

## 2018-06-27 DIAGNOSIS — R0609 Other forms of dyspnea: Secondary | ICD-10-CM

## 2018-06-27 DIAGNOSIS — Z951 Presence of aortocoronary bypass graft: Secondary | ICD-10-CM

## 2018-06-27 DIAGNOSIS — I10 Essential (primary) hypertension: Secondary | ICD-10-CM

## 2018-06-27 DIAGNOSIS — G4733 Obstructive sleep apnea (adult) (pediatric): Secondary | ICD-10-CM

## 2018-06-27 DIAGNOSIS — I739 Peripheral vascular disease, unspecified: Secondary | ICD-10-CM

## 2018-06-27 DIAGNOSIS — R42 Dizziness and giddiness: Secondary | ICD-10-CM

## 2018-06-27 DIAGNOSIS — I779 Disorder of arteries and arterioles, unspecified: Secondary | ICD-10-CM | POA: Diagnosis not present

## 2018-06-27 DIAGNOSIS — I25708 Atherosclerosis of coronary artery bypass graft(s), unspecified, with other forms of angina pectoris: Secondary | ICD-10-CM | POA: Diagnosis not present

## 2018-06-27 NOTE — Patient Instructions (Signed)
We will schedule you for an Echocardiogram and a Cardiopulmonary stress test  Continue your current therapy

## 2018-07-04 ENCOUNTER — Other Ambulatory Visit: Payer: Self-pay

## 2018-07-04 ENCOUNTER — Ambulatory Visit (HOSPITAL_COMMUNITY): Payer: PPO | Attending: Cardiology

## 2018-07-04 DIAGNOSIS — I371 Nonrheumatic pulmonary valve insufficiency: Secondary | ICD-10-CM | POA: Insufficient documentation

## 2018-07-04 DIAGNOSIS — I779 Disorder of arteries and arterioles, unspecified: Secondary | ICD-10-CM

## 2018-07-04 DIAGNOSIS — I071 Rheumatic tricuspid insufficiency: Secondary | ICD-10-CM | POA: Diagnosis not present

## 2018-07-04 DIAGNOSIS — R42 Dizziness and giddiness: Secondary | ICD-10-CM

## 2018-07-04 DIAGNOSIS — I25708 Atherosclerosis of coronary artery bypass graft(s), unspecified, with other forms of angina pectoris: Secondary | ICD-10-CM | POA: Diagnosis not present

## 2018-07-04 DIAGNOSIS — E785 Hyperlipidemia, unspecified: Secondary | ICD-10-CM | POA: Diagnosis not present

## 2018-07-04 DIAGNOSIS — Z951 Presence of aortocoronary bypass graft: Secondary | ICD-10-CM

## 2018-07-04 DIAGNOSIS — G4730 Sleep apnea, unspecified: Secondary | ICD-10-CM | POA: Diagnosis not present

## 2018-07-04 DIAGNOSIS — I10 Essential (primary) hypertension: Secondary | ICD-10-CM

## 2018-07-04 DIAGNOSIS — E119 Type 2 diabetes mellitus without complications: Secondary | ICD-10-CM | POA: Diagnosis not present

## 2018-07-04 DIAGNOSIS — I2510 Atherosclerotic heart disease of native coronary artery without angina pectoris: Secondary | ICD-10-CM | POA: Insufficient documentation

## 2018-07-04 DIAGNOSIS — R0609 Other forms of dyspnea: Secondary | ICD-10-CM | POA: Diagnosis not present

## 2018-07-04 DIAGNOSIS — I110 Hypertensive heart disease with heart failure: Secondary | ICD-10-CM | POA: Diagnosis not present

## 2018-07-04 DIAGNOSIS — I509 Heart failure, unspecified: Secondary | ICD-10-CM | POA: Diagnosis not present

## 2018-07-04 DIAGNOSIS — G4733 Obstructive sleep apnea (adult) (pediatric): Secondary | ICD-10-CM | POA: Diagnosis not present

## 2018-07-04 DIAGNOSIS — I739 Peripheral vascular disease, unspecified: Secondary | ICD-10-CM

## 2018-07-04 MED ORDER — PERFLUTREN LIPID MICROSPHERE
1.0000 mL | INTRAVENOUS | Status: AC | PRN
  Administered 2018-07-04: 2 mL via INTRAVENOUS

## 2018-07-10 ENCOUNTER — Other Ambulatory Visit (HOSPITAL_COMMUNITY): Payer: Self-pay | Admitting: *Deleted

## 2018-07-10 ENCOUNTER — Ambulatory Visit (HOSPITAL_COMMUNITY): Payer: PPO | Attending: Cardiology

## 2018-07-10 DIAGNOSIS — R0609 Other forms of dyspnea: Secondary | ICD-10-CM | POA: Insufficient documentation

## 2018-07-10 DIAGNOSIS — I10 Essential (primary) hypertension: Secondary | ICD-10-CM

## 2018-07-10 DIAGNOSIS — R42 Dizziness and giddiness: Secondary | ICD-10-CM | POA: Diagnosis not present

## 2018-07-10 DIAGNOSIS — G4733 Obstructive sleep apnea (adult) (pediatric): Secondary | ICD-10-CM | POA: Insufficient documentation

## 2018-07-10 DIAGNOSIS — Z951 Presence of aortocoronary bypass graft: Secondary | ICD-10-CM | POA: Diagnosis not present

## 2018-07-10 DIAGNOSIS — I779 Disorder of arteries and arterioles, unspecified: Secondary | ICD-10-CM

## 2018-07-10 DIAGNOSIS — I25708 Atherosclerosis of coronary artery bypass graft(s), unspecified, with other forms of angina pectoris: Secondary | ICD-10-CM | POA: Diagnosis not present

## 2018-07-10 DIAGNOSIS — I739 Peripheral vascular disease, unspecified: Secondary | ICD-10-CM

## 2018-08-05 ENCOUNTER — Ambulatory Visit (HOSPITAL_COMMUNITY)
Admission: RE | Admit: 2018-08-05 | Discharge: 2018-08-05 | Disposition: A | Discharge status: Home / Self Care | Payer: PPO | Source: Ambulatory Visit | Attending: Cardiovascular Disease | Admitting: Cardiovascular Disease

## 2018-08-05 DIAGNOSIS — I6523 Occlusion and stenosis of bilateral carotid arteries: Secondary | ICD-10-CM | POA: Insufficient documentation

## 2018-08-07 ENCOUNTER — Other Ambulatory Visit: Payer: Self-pay

## 2018-08-07 DIAGNOSIS — I6523 Occlusion and stenosis of bilateral carotid arteries: Secondary | ICD-10-CM

## 2018-08-20 DIAGNOSIS — Z8546 Personal history of malignant neoplasm of prostate: Secondary | ICD-10-CM | POA: Diagnosis not present

## 2018-08-27 DIAGNOSIS — N393 Stress incontinence (female) (male): Secondary | ICD-10-CM | POA: Diagnosis not present

## 2018-08-27 DIAGNOSIS — C61 Malignant neoplasm of prostate: Secondary | ICD-10-CM | POA: Diagnosis not present

## 2018-09-01 DIAGNOSIS — J209 Acute bronchitis, unspecified: Secondary | ICD-10-CM | POA: Diagnosis not present

## 2018-09-13 ENCOUNTER — Other Ambulatory Visit: Payer: Self-pay | Admitting: Cardiology

## 2018-09-16 NOTE — Telephone Encounter (Signed)
Ok to refill Coreg. Plenty of refills  Katti Pelle Martinique MD, [facility]

## 2018-09-16 NOTE — Telephone Encounter (Signed)
Please advise on current medication carvedilol 12.5 mg? Rx request and none shown in patient chart that was sent by Dr. Martinique?

## 2018-09-23 DIAGNOSIS — E785 Hyperlipidemia, unspecified: Secondary | ICD-10-CM | POA: Diagnosis not present

## 2018-09-23 DIAGNOSIS — I2510 Atherosclerotic heart disease of native coronary artery without angina pectoris: Secondary | ICD-10-CM | POA: Diagnosis not present

## 2018-09-23 DIAGNOSIS — Z23 Encounter for immunization: Secondary | ICD-10-CM | POA: Diagnosis not present

## 2018-09-23 DIAGNOSIS — G4733 Obstructive sleep apnea (adult) (pediatric): Secondary | ICD-10-CM | POA: Diagnosis not present

## 2018-09-23 DIAGNOSIS — Z8546 Personal history of malignant neoplasm of prostate: Secondary | ICD-10-CM | POA: Diagnosis not present

## 2018-09-23 DIAGNOSIS — E669 Obesity, unspecified: Secondary | ICD-10-CM | POA: Diagnosis not present

## 2018-09-23 DIAGNOSIS — N183 Chronic kidney disease, stage 3 (moderate): Secondary | ICD-10-CM | POA: Diagnosis not present

## 2018-09-23 DIAGNOSIS — I10 Essential (primary) hypertension: Secondary | ICD-10-CM | POA: Diagnosis not present

## 2018-09-23 DIAGNOSIS — E1122 Type 2 diabetes mellitus with diabetic chronic kidney disease: Secondary | ICD-10-CM | POA: Diagnosis not present

## 2018-09-23 DIAGNOSIS — E039 Hypothyroidism, unspecified: Secondary | ICD-10-CM | POA: Diagnosis not present

## 2018-09-23 DIAGNOSIS — F325 Major depressive disorder, single episode, in full remission: Secondary | ICD-10-CM | POA: Diagnosis not present

## 2018-09-23 DIAGNOSIS — Z0000 Encounter for general adult medical examination without abnormal findings: Secondary | ICD-10-CM | POA: Diagnosis not present

## 2018-09-26 ENCOUNTER — Other Ambulatory Visit: Payer: Self-pay | Admitting: Cardiology

## 2018-10-14 ENCOUNTER — Ambulatory Visit: Payer: PPO | Admitting: Cardiology

## 2018-11-15 ENCOUNTER — Other Ambulatory Visit: Payer: Self-pay | Admitting: Cardiology

## 2018-11-17 NOTE — Telephone Encounter (Signed)
Rx has been sent to the pharmacy electronically. ° °

## 2018-11-25 DIAGNOSIS — C61 Malignant neoplasm of prostate: Secondary | ICD-10-CM | POA: Diagnosis not present

## 2018-12-16 DIAGNOSIS — J209 Acute bronchitis, unspecified: Secondary | ICD-10-CM | POA: Diagnosis not present

## 2019-01-21 ENCOUNTER — Telehealth: Payer: Self-pay | Admitting: Cardiology

## 2019-01-21 NOTE — Telephone Encounter (Signed)
Pt was supposed to get in for an appt and have yearly blood work done, and ttied to schedule an appt, but the schedule was full. He wants to know if he is okay waiting this long between appointments.

## 2019-01-21 NOTE — Telephone Encounter (Signed)
Returned call to patient no answer.LMTC. 

## 2019-01-22 NOTE — Telephone Encounter (Signed)
Returned call to patient no answer.LMTC. 

## 2019-01-27 NOTE — Telephone Encounter (Signed)
New Message   Patient would like Malachy Mood to call him after 1pm today.

## 2019-01-28 NOTE — Telephone Encounter (Signed)
Returned call to patient no answer.LMTC. 

## 2019-01-29 ENCOUNTER — Telehealth (INDEPENDENT_AMBULATORY_CARE_PROVIDER_SITE_OTHER): Payer: PPO | Admitting: Cardiology

## 2019-01-29 ENCOUNTER — Other Ambulatory Visit: Payer: Self-pay

## 2019-01-29 VITALS — BP 141/85 | HR 96

## 2019-01-29 DIAGNOSIS — R42 Dizziness and giddiness: Secondary | ICD-10-CM

## 2019-01-29 DIAGNOSIS — R5383 Other fatigue: Secondary | ICD-10-CM

## 2019-01-29 DIAGNOSIS — R0600 Dyspnea, unspecified: Secondary | ICD-10-CM | POA: Diagnosis not present

## 2019-01-29 DIAGNOSIS — I2510 Atherosclerotic heart disease of native coronary artery without angina pectoris: Secondary | ICD-10-CM

## 2019-01-29 DIAGNOSIS — I119 Hypertensive heart disease without heart failure: Secondary | ICD-10-CM | POA: Diagnosis not present

## 2019-01-29 DIAGNOSIS — E7800 Pure hypercholesterolemia, unspecified: Secondary | ICD-10-CM

## 2019-01-29 MED ORDER — EMPAGLIFLOZIN 10 MG PO TABS: 10.0000 mg | ORAL_TABLET | Freq: Every day | ORAL | Status: DC

## 2019-01-29 MED ORDER — ROSUVASTATIN CALCIUM 10 MG PO TABS: 10.0000 mg | ORAL_TABLET | Freq: Every day | ORAL | 3 refills | Status: DC

## 2019-01-29 NOTE — Patient Instructions (Signed)
Taper off Coreg. Take half your current dose for 5 days then half of that for 5 days then stop  We will switch simvastatin to Crestor 10 mg daily   Continue your other therapy  Monitor your blood pressure.  Let me know if you feel better or not.

## 2019-01-29 NOTE — Telephone Encounter (Signed)
Returned call to patient he stated he wanted to schedule appointment with Dr.Jordan to discuss his medications.Tele visit scheduled today at 4:20 pm.

## 2019-01-29 NOTE — Telephone Encounter (Signed)
Follow Up:; ° ° °Returning your call. °

## 2019-01-29 NOTE — Progress Notes (Signed)
Virtual Visit via Telephone Note    Evaluation Performed:  Follow-up visit  This visit type was conducted due to national recommendations for restrictions regarding the COVID-19 Pandemic (e.g. social distancing).  This format is felt to be most appropriate for this patient at this time.  All issues noted in this document were discussed and addressed.  No physical exam was performed (except for noted visual exam findings with Video Visits).  Please refer to the patient's chart (MyChart message for video visits and phone note for telephone visits) for the patient's consent to telehealth for [facility].   Date:  01/29/2019   ID:  Paul [name], DOB 02/19/1946, MRN [id number]  Patient Location:  home  Provider location:   office  PCP:  [name], MD  Cardiologist:  Justan Gaede Martinique MD Electrophysiologist:  None   Chief Complaint:  Fatigue, dizziness, SOB  History of Present Illness:    Paul [name] is a 73 y.o. male who presents via audio/video conferencing for a telehealth visit today.  He has a history including CAD status post coronary artery bypass grafting in 2005, hypertension, hyperlipidemia, diabetes, hypothyroidism, and obstructive sleep apnea.  In November 2016, he also reported chest tightness and  underwent stress testing which showed inferolateral infarct with  peri-infarct ischemia. This resulted in diagnostic catheterization being performed and revealing severe disease in the vein graft to the OM 2. The vein graft to the PDA was occluded and the distal right coronary artery filled via left-to-right collaterals. The vein graft to the OM 2 was felt to be the culprit and was successfully stented. Following the procedure, patient initially had some shortness of breath which ultimately was secondary to Brilinta and once he was switched to Plavix, he felt significantly better.   On prior visits he was noted to have symptoms of dizziness felt to be related to his BP  meds. His losartan was reduced. He states his symptoms continued until he stopped taking Jardiance and then his symptoms resolved. He is now on metformin and glimiperide.    Last year BP was persistently high and Metoprolol and losartan doses were increased. He continued to  complain of exertional fatigue. He underwent repeat cardiac cath in August 2018  that showed patent grafts except known occlusion of SVG to RCA. No new disease to explain his symptoms. He was later switched from metoprolol to Coreg for better BP control. BP improved and Coreg dose was reduced due to persistent symptoms of dizziness and fatigue.   On our call today he complains of persistent symptoms of fatigue, dizziness, and SOB. States this is negatively impacting his quality of life. At times he has quit or missed taking his medication for a couple of days and feels better. He does note his BP will then go up by 15-20 points. He has been started recently on Jardiance. No chest pain.    The patient does not have symptoms concerning for COVID-19 infection (fever, chills, cough, or new shortness of breath).    Prior CV studies:   The following studies were reviewed today:  none  Past Medical History:  Diagnosis Date   Allergy    Coronary artery disease    a. s/p CABG;  b. 08/2015 MV: inf/inflat infarct w/ new peri-infarct isch; c. LHC 11/16: pLAD 70, dLAD 50, D1 100, oRI 100, mLCx 60, OM2 99, pRCA 99 (L>R collats), S-RPDA 100, S-D1 ok, S-RI/OM2 mid 85 (PCI: Synergy DES), L-LAD ok, EF 55-65%.   Depression  GERD (gastroesophageal reflux disease)    Hyperlipidemia    Hypertensive heart disease    Hypothyroidism    Insomnia    OSA (obstructive sleep apnea)    CPAP   Prostate cancer ([place])    Type II diabetes mellitus ([place])    Past Surgical History:  Procedure Laterality Date   CARDIAC CATHETERIZATION N/A 09/19/2015   Procedure: Coronary Stent Intervention;  Surgeon: Mishael Krysiak M Martinique, MD;  Location: [place] [facility];  Service: Cardiovascular;  Laterality: N/A;   CARDIAC CATHETERIZATION  09/19/2015   Procedure: Left Heart Cath and Cors/Grafts Angiography;  Surgeon: Loisann Roach M Martinique, MD;  Location: [place] [facility];  Service: Cardiovascular;;   CHOLECYSTECTOMY     CORONARY ARTERY BYPASS GRAFT     JOINT REPLACEMENT     right knee   LEFT HEART CATH AND CORS/GRAFTS ANGIOGRAPHY N/A 06/26/2017   Procedure: LEFT HEART CATH AND CORS/GRAFTS ANGIOGRAPHY;  Surgeon: Martinique, Giani Betzold M, MD;  Location: [place] [facility];  Service: Cardiovascular;  Laterality: N/A;   PROSTATE SURGERY       Current Meds  Medication Sig   acetaminophen (TYLENOL 8 HOUR ARTHRITIS PAIN) 650 MG CR tablet Take 1,300 mg by mouth every 8 (eight) hours as needed for pain.   amLODipine (NORVASC) 5 MG tablet Take 1&1/2 tablets ( 7.5 mg ) daily   aspirin EC 81 MG tablet Take 81 mg by mouth daily.   carvedilol (COREG) 12.5 MG tablet    carvedilol (COREG) 12.5 MG tablet TAKE 1 TABLET BY MOUTH TWICE DAILY   Cholecalciferol (VITAMIN D) 2000 units tablet Take 2,000 Units by mouth daily.   colestipol (COLESTID) 1 G tablet Take 1 g by mouth at bedtime.    DULoxetine (CYMBALTA) 60 MG capsule Take 60 mg by mouth daily with breakfast.    glimepiride (AMARYL) 4 MG tablet Take 4 mg by mouth daily with breakfast.    levothyroxine (SYNTHROID, LEVOTHROID) 75 MCG tablet Take 75 mcg by mouth daily before breakfast.    liothyronine (CYTOMEL) 5 MCG tablet Take 5 mcg by mouth daily with breakfast.    losartan (COZAAR) 100 MG tablet TAKE 1 TABLET BY MOUTH ONCE DAILY   metFORMIN (GLUCOPHAGE) 1000 MG tablet Take 1 tablet (1,000 mg total) by mouth 2 (two) times daily.   Multiple Vitamin (MULTIVITAMIN) tablet Take 1 tablet by mouth daily. Reported on 11/10/2015   pantoprazole (PROTONIX) 20 MG tablet Take 1 tablet (20 mg total) by mouth daily.   potassium citrate (UROCIT-K) 10 MEQ (1080 MG) SR tablet Take 10 mEq by mouth 2 (two) times  daily.    vitamin B-12 (CYANOCOBALAMIN) 250 MCG tablet Take 250 mcg by mouth daily.   [DISCONTINUED] simvastatin (ZOCOR) 20 MG tablet Take 1 tablet (20 mg total) by mouth at bedtime. KEEP OV.     Allergies:   Brilinta [ticagrelor]; Yellow jacket venom; Morphine; Penicillins; Percocet [oxycodone-acetaminophen]; Septra ds [sulfamethoxazole-trimethoprim]; Vicodin [hydrocodone-acetaminophen]; and Alprazolam   Social History   Tobacco Use   Smoking status: Never Smoker   Smokeless tobacco: Never Used  Substance Use Topics   Alcohol use: No    Alcohol/week: 0.0 standard drinks   Drug use: No     Family Hx: The patient's family history includes Heart attack in his father; Heart disease in his mother; Vascular Disease in his sister.  ROS:   Please see the history of present illness.    All other systems reviewed and are negative.   Labs/Other Tests and Data Reviewed:  Recent Labs: 06/24/2018: ALT 27; BUN 23; Creatinine, Ser 1.46; Potassium 4.3; Sodium 139   Recent Lipid Panel Lab Results  Component Value Date/Time   CHOL 162 06/24/2018 08:48 AM   TRIG 211 (H) 06/24/2018 08:48 AM   HDL 32 (L) 06/24/2018 08:48 AM   CHOLHDL 4.5 08/05/2017 08:35 AM   CHOLHDL 4.5 07/20/2016 09:50 AM   LDLCALC 88 06/24/2018 08:48 AM   LDLDIRECT 76.0 02/28/2015 09:39 AM    Wt Readings from Last 3 Encounters:  06/27/18 216 lb 3.2 oz (98.1 kg)  03/06/18 221 lb 3.2 oz (100.3 kg)  02/11/18 219 lb 9.6 oz (99.6 kg)     Objective:    Vital Signs:  BP (!) 141/85    Pulse 96  per patient report   ASSESSMENT & PLAN:    1.   Fatigue/dizziness/dyspnea.  Prior changes in medication really hasn't impacted this but patient thinks medication may be the cause. Of his current medication the Coreg is the most likely to cause these symptoms. Will taper off Coreg and see if symptoms improve. Continue current amlodipine and losartan. He is to monitor his BP. If it goes up and symptoms improve may need to  try something different like bystolic or hydralazine.  We will also switch simvastatin to Crestor 10 mg daily since there is an interaction between amlodipine and simvastatin.   2. Coronary artery disease: Status post prior CABG in 2005 with PCI and DES placement to the vein graft to the second obtuse marginal in August 2016. Catheterization at that time also revealed a patent LIMA to the LAD and vein graft to the diagonal. The vein graft to the PDA was occluded as was the native right coronary artery, though the distal right coronary was fed via left-to-right collaterals.  Not a candidate for CTO PCI.  Last cardiac cath in August 2018 showed no change in disease.   3. Hypertensive heart disease- see above  4. Hyperlipidemia: change to Crestor. Continue bile acid sequestrant for IBS.   5. Proteinuria - followed by Renal.   COVID-19 Education: The signs and symptoms of COVID-19 were discussed with the patient and how to seek care for testing (follow up with PCP or arrange E-visit).  The importance of social distancing was discussed today.  Patient Risk:   After full review of this patient's clinical status, I feel that they are at least moderate risk at this time.  Time:   Today, I have spent 20 minutes with the patient with telehealth technology discussing above     Medication Adjustments/Labs and Tests Ordered: Current medicines are reviewed at length with the patient today.  Concerns regarding medicines are outlined above.  Tests Ordered: No orders of the defined types were placed in this encounter.  Medication Changes: Meds ordered this encounter  Medications   empagliflozin (JARDIANCE) 10 MG TABS tablet    Sig: Take 10 mg by mouth daily.    Dispense:  30 tablet    Disposition:  Follow up 4 months  Signed, Benigno Check Martinique, MD  01/29/2019 4:39 PM    [place]

## 2019-02-20 DIAGNOSIS — C61 Malignant neoplasm of prostate: Secondary | ICD-10-CM | POA: Diagnosis not present

## 2019-02-27 DIAGNOSIS — N393 Stress incontinence (female) (male): Secondary | ICD-10-CM | POA: Diagnosis not present

## 2019-02-27 DIAGNOSIS — C61 Malignant neoplasm of prostate: Secondary | ICD-10-CM | POA: Diagnosis not present

## 2019-04-02 DIAGNOSIS — N183 Chronic kidney disease, stage 3 (moderate): Secondary | ICD-10-CM | POA: Diagnosis not present

## 2019-04-02 DIAGNOSIS — E1122 Type 2 diabetes mellitus with diabetic chronic kidney disease: Secondary | ICD-10-CM | POA: Diagnosis not present

## 2019-04-02 DIAGNOSIS — I10 Essential (primary) hypertension: Secondary | ICD-10-CM | POA: Diagnosis not present

## 2019-04-02 DIAGNOSIS — E785 Hyperlipidemia, unspecified: Secondary | ICD-10-CM | POA: Diagnosis not present

## 2019-04-02 DIAGNOSIS — F325 Major depressive disorder, single episode, in full remission: Secondary | ICD-10-CM | POA: Diagnosis not present

## 2019-04-02 DIAGNOSIS — E039 Hypothyroidism, unspecified: Secondary | ICD-10-CM | POA: Diagnosis not present

## 2019-04-02 DIAGNOSIS — K219 Gastro-esophageal reflux disease without esophagitis: Secondary | ICD-10-CM | POA: Diagnosis not present

## 2019-04-02 DIAGNOSIS — I2510 Atherosclerotic heart disease of native coronary artery without angina pectoris: Secondary | ICD-10-CM | POA: Diagnosis not present

## 2019-04-07 DIAGNOSIS — N183 Chronic kidney disease, stage 3 (moderate): Secondary | ICD-10-CM | POA: Diagnosis not present

## 2019-04-07 DIAGNOSIS — I10 Essential (primary) hypertension: Secondary | ICD-10-CM | POA: Diagnosis not present

## 2019-04-07 DIAGNOSIS — E785 Hyperlipidemia, unspecified: Secondary | ICD-10-CM | POA: Diagnosis not present

## 2019-04-07 DIAGNOSIS — G4733 Obstructive sleep apnea (adult) (pediatric): Secondary | ICD-10-CM | POA: Diagnosis not present

## 2019-04-07 DIAGNOSIS — J209 Acute bronchitis, unspecified: Secondary | ICD-10-CM | POA: Diagnosis not present

## 2019-04-07 DIAGNOSIS — E039 Hypothyroidism, unspecified: Secondary | ICD-10-CM | POA: Diagnosis not present

## 2019-04-07 DIAGNOSIS — Z8546 Personal history of malignant neoplasm of prostate: Secondary | ICD-10-CM | POA: Diagnosis not present

## 2019-04-07 DIAGNOSIS — I2510 Atherosclerotic heart disease of native coronary artery without angina pectoris: Secondary | ICD-10-CM | POA: Diagnosis not present

## 2019-04-07 DIAGNOSIS — E1122 Type 2 diabetes mellitus with diabetic chronic kidney disease: Secondary | ICD-10-CM | POA: Diagnosis not present

## 2019-04-07 DIAGNOSIS — E669 Obesity, unspecified: Secondary | ICD-10-CM | POA: Diagnosis not present

## 2019-04-07 DIAGNOSIS — K219 Gastro-esophageal reflux disease without esophagitis: Secondary | ICD-10-CM | POA: Diagnosis not present

## 2019-04-07 DIAGNOSIS — F325 Major depressive disorder, single episode, in full remission: Secondary | ICD-10-CM | POA: Diagnosis not present

## 2019-04-08 ENCOUNTER — Other Ambulatory Visit: Payer: Self-pay | Admitting: Cardiology

## 2019-04-09 ENCOUNTER — Other Ambulatory Visit: Payer: Self-pay

## 2019-04-09 MED ORDER — AMLODIPINE BESYLATE 5 MG PO TABS: ORAL_TABLET | ORAL | 3 refills | Status: DC

## 2019-04-22 DIAGNOSIS — E119 Type 2 diabetes mellitus without complications: Secondary | ICD-10-CM | POA: Diagnosis not present

## 2019-04-22 DIAGNOSIS — Z961 Presence of intraocular lens: Secondary | ICD-10-CM | POA: Diagnosis not present

## 2019-06-02 DIAGNOSIS — G4733 Obstructive sleep apnea (adult) (pediatric): Secondary | ICD-10-CM | POA: Diagnosis not present

## 2019-06-05 DIAGNOSIS — G4733 Obstructive sleep apnea (adult) (pediatric): Secondary | ICD-10-CM | POA: Diagnosis not present

## 2019-06-08 ENCOUNTER — Encounter: Payer: Self-pay | Admitting: Medical Oncology

## 2019-06-16 DIAGNOSIS — C61 Malignant neoplasm of prostate: Secondary | ICD-10-CM | POA: Diagnosis not present

## 2019-06-17 ENCOUNTER — Telehealth: Payer: Self-pay | Admitting: Medical Oncology

## 2019-06-17 NOTE — Telephone Encounter (Signed)
I called pt to introduce myself as the Prostate Nurse Navigator and the Coordinator of the Prostate [place].  1. I confirmed with the patient he is aware of his referral to the clinic 06/23/19.  2. I discussed the format of the clinic and the physicians he will be seeing that day.  3. I discussed the clinic is being held by [employer] or telephone. He states he has a computer with a camera so we will plan to do WebEx.   4. I confirmed he received the packet of information about the clinic. I explained there are 2 forms that he will need to complete for the clinic discussion.  He voiced understanding of the above. I asked him to call me if he has any questions or concerns regarding his appointments or the forms he needs to complete.

## 2019-06-19 ENCOUNTER — Telehealth: Payer: Self-pay | Admitting: Medical Oncology

## 2019-06-19 NOTE — Telephone Encounter (Signed)
Mr. Melahn and I did a trial WebEx meeting with success. I will schedule WebEx for [facility] 06/23/19.

## 2019-06-23 ENCOUNTER — Encounter: Payer: Self-pay | Admitting: Medical Oncology

## 2019-06-23 ENCOUNTER — Ambulatory Visit
Admission: RE | Admit: 2019-06-23 | Discharge: 2019-06-23 | Disposition: A | Discharge status: Home / Self Care | Payer: PPO | Source: Ambulatory Visit | Attending: Radiation Oncology | Admitting: Radiation Oncology

## 2019-06-23 ENCOUNTER — Inpatient Hospital Stay: Payer: PPO | Attending: Oncology | Admitting: Oncology

## 2019-06-23 DIAGNOSIS — C61 Malignant neoplasm of prostate: Secondary | ICD-10-CM | POA: Diagnosis not present

## 2019-06-23 DIAGNOSIS — E119 Type 2 diabetes mellitus without complications: Secondary | ICD-10-CM | POA: Diagnosis not present

## 2019-06-23 DIAGNOSIS — Z8546 Personal history of malignant neoplasm of prostate: Secondary | ICD-10-CM

## 2019-06-23 DIAGNOSIS — Z7982 Long term (current) use of aspirin: Secondary | ICD-10-CM

## 2019-06-23 DIAGNOSIS — E039 Hypothyroidism, unspecified: Secondary | ICD-10-CM

## 2019-06-23 DIAGNOSIS — R9721 Rising PSA following treatment for malignant neoplasm of prostate: Secondary | ICD-10-CM | POA: Diagnosis not present

## 2019-06-23 DIAGNOSIS — Z9079 Acquired absence of other genital organ(s): Secondary | ICD-10-CM | POA: Diagnosis not present

## 2019-06-23 DIAGNOSIS — Z79899 Other long term (current) drug therapy: Secondary | ICD-10-CM | POA: Diagnosis not present

## 2019-06-23 DIAGNOSIS — I10 Essential (primary) hypertension: Secondary | ICD-10-CM

## 2019-06-23 NOTE — Progress Notes (Signed)
                               Care Plan Summary  Name: Mr. Rual Boltz DOB: [birth year]   Your Medical Team:   Urologist -  Dr. Raynelle Bring, [facility]  Radiation Oncologist - Dr. Tyler Pita, [facility]   Medical Oncologist - Dr. Zola Button, [place]  Recommendations: 1) Continue active surveillance with Dr.Wrenn  2) PSA lab in 3 months  * These recommendations are based on information available as of today's consult.      Recommendations may change depending on the results of further tests or exams.  Next Steps: 1) [facility] with call you with lab appointment for 3 months   When appointments need to be scheduled, you will be contacted by [facility] and/or [facility].  Questions?  Please do not hesitate to call Cira Rue, RN, BSN, OCN at [phone] any questions or concerns.  Shirlean Mylar is your Oncology Nurse Navigator and is available to assist you while you're receiving your medical care at [facility].

## 2019-06-23 NOTE — Progress Notes (Signed)
Hematology and Oncology Follow Up for Telemedicine Visits  Paul [name] [facility] [id number] 1945-11-04 73 y.o. 06/23/2019 1:30 PM Paul [name], MDMcNeill, Paul Butts, MD   I connected with [name] on 06/23/19 at  1:15 PM EDT by video enabled telemedicine visit and verified that I am speaking with the correct person using two identifiers.   I discussed the limitations, risks, security and privacy concerns of performing an evaluation and management service by telemedicine and the availability of in-person appointments. I also discussed with the patient that there may be a patient responsible charge related to this service. The patient expressed understanding and agreed to proceed.  Other persons participating in the visit and their role in the encounter: His spouse  Patient's location: Home Provider's location: Office    Reason for the request:   Prostate cancer  HPI: I was asked by Dr. Alinda Money  to evaluate [name] for the evaluation of prostate cancer.  He is a 73 year old man native of [place] but currently resides in [facility].  He has history of coronary artery disease as well as hypertension was diagnosed with prostate cancer in 2007.  At that time he underwent a radical prostatectomy and then found to have a Gleason score 3+4 = 7 involving the left lobe of the prostate with extraprostatic extension is identified.  Margins were negative with the final pathological staging was T3a.  He has been followed by Dr. Alinda Money since that time with a PSA that has been undetectable 09/2018.  In October 2019 his PSA was 0.151.  In January 2020 was 0.13 and in April 2020 was 0.22.  A repeat to PSA on June 16, 2019 was 0.17.  Based on these findings the patient was discussed today in the prostate cancer multidisciplinary clinic.  He does report symptoms unrelated to his prostate cancer including dizziness, lightheadedness and occasional fatigue.  This is related to his coronary disease and his  blood pressure medication.  He denies any urinary symptoms at this time.  He does not report any headaches, blurry vision, syncope or seizures. Does not report any fevers, chills or sweats.  Does not report any cough, wheezing or hemoptysis.  Does not report any chest pain, palpitation, orthopnea or leg edema.  Does not report any nausea, vomiting or abdominal pain.  Does not report any constipation or diarrhea.  Does not report any skeletal complaints.    Does not report frequency, urgency or hematuria.  Does not report any skin rashes or lesions. Does not report any heat or cold intolerance.  Does not report any lymphadenopathy or petechiae.  Does not report any anxiety or depression.  Remaining review of systems is negative.    Past Medical History:  Diagnosis Date  . Allergy   . Coronary artery disease    a. s/p CABG;  b. 08/2015 MV: inf/inflat infarct w/ new peri-infarct isch; c. LHC 11/16: pLAD 70, dLAD 50, D1 100, oRI 100, mLCx 60, OM2 99, pRCA 99 (L>R collats), S-RPDA 100, S-D1 ok, S-RI/OM2 mid 85 (PCI: Synergy DES), L-LAD ok, EF 55-65%.  . Depression   . GERD (gastroesophageal reflux disease)   . Hyperlipidemia   . Hypertensive heart disease   . Hypothyroidism   . Insomnia   . OSA (obstructive sleep apnea)    CPAP  . Prostate cancer ([place])   . Type II diabetes mellitus ([place])   :  Past Surgical History:  Procedure Laterality Date  . CARDIAC CATHETERIZATION N/A 09/19/2015   Procedure: Coronary  Stent Intervention;  Surgeon: Peter M Martinique, MD;  Location: [facility];  Service: Cardiovascular;  Laterality: N/A;  . CARDIAC CATHETERIZATION  09/19/2015   Procedure: Left Heart Cath and Cors/Grafts Angiography;  Surgeon: Peter M Martinique, MD;  Location: [facility];  Service: Cardiovascular;;  . CHOLECYSTECTOMY    . CORONARY ARTERY BYPASS GRAFT    . JOINT REPLACEMENT     right knee  . LEFT HEART CATH AND CORS/GRAFTS ANGIOGRAPHY N/A 06/26/2017   Procedure: LEFT HEART CATH  AND CORS/GRAFTS ANGIOGRAPHY;  Surgeon: Martinique, Peter M, MD;  Location: [facility];  Service: Cardiovascular;  Laterality: N/A;  . PROSTATE SURGERY    :   Current Outpatient Medications:  .  acetaminophen (TYLENOL 8 HOUR ARTHRITIS PAIN) 650 MG CR tablet, Take 1,300 mg by mouth every 8 (eight) hours as needed for pain., Disp: , Rfl:  .  amLODipine (NORVASC) 5 MG tablet, TAKE 1 & 1/2 (ONE & ONE-HALF) TABLETS BY MOUTH  DAILY, Disp: 135 tablet, Rfl: 3 .  aspirin EC 81 MG tablet, Take 81 mg by mouth daily., Disp: , Rfl:  .  carvedilol (COREG) 12.5 MG tablet, , Disp: , Rfl:  .  carvedilol (COREG) 12.5 MG tablet, TAKE 1 TABLET BY MOUTH TWICE DAILY, Disp: 180 tablet, Rfl: 3 .  Cholecalciferol (VITAMIN D) 2000 units tablet, Take 2,000 Units by mouth daily., Disp: , Rfl:  .  clindamycin (CLEOCIN) 150 MG capsule, Take 300 mg by mouth See admin instructions. Take 2 capsules (300 mg) 1 hour before dental procedure., Disp: , Rfl:  .  Coenzyme Q10 (COQ10) 200 MG CAPS, Take 200 mg by mouth 2 (two) times daily., Disp: , Rfl:  .  colestipol (COLESTID) 1 G tablet, Take 1 g by mouth at bedtime. , Disp: , Rfl:  .  doxycycline (VIBRAMYCIN) 100 MG capsule, Take 100 mg by mouth 2 (two) times daily., Disp: , Rfl:  .  DULoxetine (CYMBALTA) 60 MG capsule, Take 60 mg by mouth daily with breakfast. , Disp: , Rfl:  .  empagliflozin (JARDIANCE) 10 MG TABS tablet, Take 10 mg by mouth daily., Disp: 30 tablet, Rfl:  .  EPINEPHrine (EPIPEN 2-PAK) 0.3 mg/0.3 mL IJ SOAJ injection, Inject 0.3 mg into the muscle daily as needed (for anaphylatic allergic reaction.)., Disp: , Rfl:  .  fluticasone (FLONASE) 50 MCG/ACT nasal spray, Place 2 sprays into both nostrils daily as needed for allergies or rhinitis., Disp: , Rfl:  .  glimepiride (AMARYL) 4 MG tablet, Take 4 mg by mouth daily with breakfast. , Disp: , Rfl:  .  guaiFENesin (MUCINEX) 600 MG 12 hr tablet, Take 600-1,200 mg by mouth 2 (two) times daily as needed (for sinus  drainage/cough/congestion)., Disp: , Rfl:  .  levothyroxine (SYNTHROID, LEVOTHROID) 75 MCG tablet, Take 75 mcg by mouth daily before breakfast. , Disp: , Rfl:  .  liothyronine (CYTOMEL) 5 MCG tablet, Take 5 mcg by mouth daily with breakfast. , Disp: , Rfl:  .  loperamide (IMODIUM A-D) 2 MG tablet, Take 2-4 mg by mouth 4 (four) times daily as needed for diarrhea or loose stools., Disp: , Rfl:  .  losartan (COZAAR) 100 MG tablet, TAKE 1 TABLET BY MOUTH ONCE DAILY, Disp: 90 tablet, Rfl: 1 .  metFORMIN (GLUCOPHAGE) 1000 MG tablet, Take 1 tablet (1,000 mg total) by mouth 2 (two) times daily., Disp: , Rfl:  .  Multiple Vitamin (MULTIVITAMIN) tablet, Take 1 tablet by mouth daily. Reported on 11/10/2015, Disp: ,  Rfl:  .  nitroGLYCERIN (NITROSTAT) 0.4 MG SL tablet, DISSOLVE ONE TABLET UNDER THE TONGUE EVERY 5 MINUTES AS NEEDED FOR CHEST PAIN. (Patient not taking: Reported on 01/29/2019), Disp: 25 tablet, Rfl: 11 .  nitroGLYCERIN (NITROSTAT) 0.4 MG SL tablet, DISSOLVE ONE TABLET UNDER THE TONGUE EVERY 5 MINUTES AS NEEDED FOR CHEST PAIN. (Patient not taking: Reported on 01/29/2019), Disp: 25 tablet, Rfl: 11 .  pantoprazole (PROTONIX) 20 MG tablet, Take 1 tablet (20 mg total) by mouth daily., Disp: 90 tablet, Rfl: 0 .  potassium citrate (UROCIT-K) 10 MEQ (1080 MG) SR tablet, Take 10 mEq by mouth 2 (two) times daily. , Disp: , Rfl:  .  rosuvastatin (CRESTOR) 10 MG tablet, Take 1 tablet (10 mg total) by mouth daily., Disp: 90 tablet, Rfl: 3 .  triamcinolone cream (KENALOG) 0.1 %, Apply 1 application topically as directed., Disp: , Rfl:  .  vitamin B-12 (CYANOCOBALAMIN) 250 MCG tablet, Take 250 mcg by mouth daily., Disp: , Rfl: :  Allergies  Allergen Reactions  . Brilinta [Ticagrelor] Shortness Of Breath  . Yellow Jacket Venom Anaphylaxis  . Morphine Other (See Comments)    Hallucinations/nightmares when taking for greater than 24hrs.  . Penicillins Other (See Comments)    "Eyes swell shut" Has patient had a PCN  reaction causing immediate rash, facial/tongue/throat swelling, SOB or lightheadedness with hypotension:Yes Has patient had a PCN reaction causing severe rash involving mucus membranes or skin necrosis:No Has patient had a PCN reaction that required hospitalization: No Has patient had a PCN reaction occurring within the last 10 years: No If all of the above answers are "NO", then may proceed with Cephalosporin use.   Marland Kitchen Percocet [Oxycodone-Acetaminophen]     Nightmares/hallucinations  . Septra Ds [Sulfamethoxazole-Trimethoprim]     rash  . Vicodin [Hydrocodone-Acetaminophen] Other (See Comments)    Hallucinations/nightmares  . Alprazolam Rash  :  Family History  Problem Relation Age of Onset  . Heart disease Mother   . Heart attack Father   . Vascular Disease Sister   :  Social History   Socioeconomic History  . Marital status: Married    Spouse name: Not on file  . Number of children: Not on file  . Years of education: Not on file  . Highest education level: Not on file  Occupational History  . Not on file  Social Needs  . Financial resource strain: Not on file  . Food insecurity    Worry: Not on file    Inability: Not on file  . Transportation needs    Medical: Not on file    Non-medical: Not on file  Tobacco Use  . Smoking status: Never Smoker  . Smokeless tobacco: Never Used  Substance and Sexual Activity  . Alcohol use: No    Alcohol/week: 0.0 standard drinks  . Drug use: No  . Sexual activity: Not on file  Lifestyle  . Physical activity    Days per week: Not on file    Minutes per session: Not on file  . Stress: Not on file  Relationships  . Social Herbalist on phone: Not on file    Gets together: Not on file    Attends religious service: Not on file    Active member of club or organization: Not on file    Attends meetings of clubs or organizations: Not on file    Relationship status: Not on file  . Intimate partner violence    Fear of  current  or ex partner: Not on file    Emotionally abused: Not on file    Physically abused: Not on file    Forced sexual activity: Not on file  Other Topics Concern  . Not on file  Social History Narrative  . Not on file  :  Pertinent items are noted in HPI.  Exam: ECOG 1 General appearance: alert and cooperative appeared without distress. Head: atraumatic without any abnormalities. Eyes: conjunctivae/corneas clear. PERRL.  Sclera anicteric. Throat: lips, mucosa, and tongue normal; without oral thrush or ulcers. Resp: clear to auscultation bilaterally without rhonchi, wheezes or dullness to percussion. Cardio: regular rate and rhythm, S1, S2 normal, no murmur, click, rub or gallop GI: soft, non-tender; bowel sounds normal; no masses,  no organomegaly Skin: Skin color, texture, turgor normal. No rashes or lesions Lymph nodes: Cervical, supraclavicular, and axillary nodes normal. Neurologic: Grossly normal without any motor, sensory or deep tendon reflexes. Musculoskeletal: No joint deformity or effusion.    Assessment and Plan:    73 year old man with prostate cancer diagnosed in 2007.  He was found to have Gleason score 3+4 = 7 with a pathological stage of T3a N0 after go a radical prostatectomy.  He has been on active surveillance at this time with a slow rise in PSA started in 2019.  His PSA was 0.22 in April 2020 and subsequently dropped to 0.17 in August 2020.  His case was discussed today in the prostate cancer multidisciplinary clinic including review with pathology as well as multidisciplinary discussion with urology and radiation oncology.  Options of therapy were reviewed which includes continued active surveillance given the recent drop in his PSA, curative therapy with radiation plus or minus androgen deprivation therapy at this point or in the future.  We also discussed the role for systemic therapy if he opted not to have definitive treatment and needed palliative  therapy in the future for metastatic disease.  The role for androgen deprivation therapy as well as agents for castration resistant disease were reviewed which have been successful in maintaining and turning advanced prostate cancer into chronic disease.  After discussion today, he is comfortable with the approach of active surveillance and repeat PSA periodically and consider definitive therapy with radiation if his PSA continues to rise in the future.  I discussed the assessment and treatment plan with the patient. The patient was provided an opportunity to ask questions and all were answered. The patient agreed with the plan and demonstrated an understanding of the instructions.   The patient was advised to call back or seek an in-person evaluation if the symptoms worsen or if the condition fails to improve as anticipated.  I provided 40 minutes of face-to-face video visit time during this encounter, and > 50% was dedicated to reviewing his laboratory data, pathology reports, treatment options and answer questions about future plan of care.  Zola Button, MD 06/23/2019 1:30 PM    Thank you for the referral.  I had the pleasure of meeting this patient today.  A copy of this consult has been forwarded to the requesting physician.

## 2019-06-23 NOTE — Progress Notes (Signed)
Radiation Oncology         [phone] ________________________________  Multidisciplinary Prostate Cancer Clinic  Initial outpatient Consultation - Conducted via WebEx due to current COVID-19 concerns for limiting patient exposure  Name: Paul [name] MRN: [id number]  Date: 06/23/2019  DOB: [birth year]  [id number], Paul Butts, MD  Raynelle Bring, MD   REFERRING PHYSICIAN: Raynelle Bring, MD  DIAGNOSIS: 73 y.o. 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    ICD-10-CM   1. Malignant neoplasm of prostate ([place])  C61     HISTORY OF PRESENT ILLNESS: Paul [name] is a 73 y.o. male with a history of prostate cancer. He was initially diagnosed in 2007 and elected to undergo UNS RAL on 05/28/2006 which confirmed a stage pT3a N0 Mx Gleason 3+4 disease, with a pretreatment PSA of 1.35. The final pathology report indicated extraprostatic extension but margins were negative, there was no seminal vesicle involvement and 3/3 lymph nodes were negative for malignancy. His PSA initially increased as high as 0.12 but subsequently decreased to undetectable levels without intervention and remained undetectable until 07/2018 when his PSA was noted at 0.151.  His PSA has fluctuated since that time with a PSA at 0.13 in 10/2018.  However, his PSA increased to 0.22 in 01/2019.This was recently repeated on 06/16/2019 and again, was down to 0.17.  The patient reviewed the pathology and PSA results with his urologist and he has kindly been referred today to the multidisciplinary prostate cancer clinic for presentation of pathology and radiology studies in our conference for discussion of potential radiation treatment options and clinical evaluation.   PREVIOUS RADIATION THERAPY: No  PAST MEDICAL HISTORY:  Past Medical History:  Diagnosis Date  . Allergy   . Coronary artery  disease    a. s/p CABG;  b. 08/2015 MV: inf/inflat infarct w/ new peri-infarct isch; c. LHC 11/16: pLAD 70, dLAD 50, D1 100, oRI 100, mLCx 60, OM2 99, pRCA 99 (L>R collats), S-RPDA 100, S-D1 ok, S-RI/OM2 mid 85 (PCI: Synergy DES), L-LAD ok, EF 55-65%.  . Depression   . GERD (gastroesophageal reflux disease)   . Hyperlipidemia   . Hypertensive heart disease   . Hypothyroidism   . Insomnia   . OSA (obstructive sleep apnea)    CPAP  . Prostate cancer ([place])   . Type II diabetes mellitus ([place])       PAST SURGICAL HISTORY: Past Surgical History:  Procedure Laterality Date  . CARDIAC CATHETERIZATION N/A 09/19/2015   Procedure: Coronary Stent Intervention;  Surgeon: Peter M Martinique, MD;  Location: [facility];  Service: Cardiovascular;  Laterality: N/A;  . CARDIAC CATHETERIZATION  09/19/2015   Procedure: Left Heart Cath and Cors/Grafts Angiography;  Surgeon: Peter M Martinique, MD;  Location: [facility];  Service: Cardiovascular;;  . CHOLECYSTECTOMY    . CORONARY ARTERY BYPASS GRAFT    . JOINT REPLACEMENT     right knee  . LEFT HEART CATH AND CORS/GRAFTS ANGIOGRAPHY N/A 06/26/2017   Procedure: LEFT HEART CATH AND CORS/GRAFTS ANGIOGRAPHY;  Surgeon: Martinique, Peter M, MD;  Location: [facility];  Service: Cardiovascular;  Laterality: N/A;  . PROSTATE SURGERY      FAMILY HISTORY:  Family History  Problem Relation Age of Onset  . Heart disease Mother   . Heart attack Father   . Vascular Disease Sister     SOCIAL  HISTORY:  Social History   Socioeconomic History  . Marital status: Married    Spouse name: Not on file  . Number of children: Not on file  . Years of education: Not on file  . Highest education level: Not on file  Occupational History  . Not on file  Social Needs  . Financial resource strain: Not on file  . Food insecurity    Worry: Not on file    Inability: Not on file  . Transportation needs    Medical: Not on file    Non-medical: Not on file   Tobacco Use  . Smoking status: Never Smoker  . Smokeless tobacco: Never Used  Substance and Sexual Activity  . Alcohol use: No    Alcohol/week: 0.0 standard drinks  . Drug use: No  . Sexual activity: Not on file  Lifestyle  . Physical activity    Days per week: Not on file    Minutes per session: Not on file  . Stress: Not on file  Relationships  . Social Herbalist on phone: Not on file    Gets together: Not on file    Attends religious service: Not on file    Active member of club or organization: Not on file    Attends meetings of clubs or organizations: Not on file    Relationship status: Not on file  . Intimate partner violence    Fear of current or ex partner: Not on file    Emotionally abused: Not on file    Physically abused: Not on file    Forced sexual activity: Not on file  Other Topics Concern  . Not on file  Social History Narrative  . Not on file    ALLERGIES: Brilinta [ticagrelor], Yellow jacket venom, Morphine, Penicillins, Percocet [oxycodone-acetaminophen], Septra ds [sulfamethoxazole-trimethoprim], Vicodin [hydrocodone-acetaminophen], and Alprazolam  MEDICATIONS:  Current Outpatient Medications  Medication Sig Dispense Refill  . acetaminophen (TYLENOL 8 HOUR ARTHRITIS PAIN) 650 MG CR tablet Take 1,300 mg by mouth every 8 (eight) hours as needed for pain.    Marland Kitchen amLODipine (NORVASC) 5 MG tablet TAKE 1 & 1/2 (ONE & ONE-HALF) TABLETS BY MOUTH  DAILY 135 tablet 3  . aspirin EC 81 MG tablet Take 81 mg by mouth daily.    . carvedilol (COREG) 12.5 MG tablet     . carvedilol (COREG) 12.5 MG tablet TAKE 1 TABLET BY MOUTH TWICE DAILY 180 tablet 3  . Cholecalciferol (VITAMIN D) 2000 units tablet Take 2,000 Units by mouth daily.    . clindamycin (CLEOCIN) 150 MG capsule Take 300 mg by mouth See admin instructions. Take 2 capsules (300 mg) 1 hour before dental procedure.    . Coenzyme Q10 (COQ10) 200 MG CAPS Take 200 mg by mouth 2 (two) times daily.    .  colestipol (COLESTID) 1 G tablet Take 1 g by mouth at bedtime.     Marland Kitchen doxycycline (VIBRAMYCIN) 100 MG capsule Take 100 mg by mouth 2 (two) times daily.    . DULoxetine (CYMBALTA) 60 MG capsule Take 60 mg by mouth daily with breakfast.     . empagliflozin (JARDIANCE) 10 MG TABS tablet Take 10 mg by mouth daily. 30 tablet   . EPINEPHrine (EPIPEN 2-PAK) 0.3 mg/0.3 mL IJ SOAJ injection Inject 0.3 mg into the muscle daily as needed (for anaphylatic allergic reaction.).    Marland Kitchen fluticasone (FLONASE) 50 MCG/ACT nasal spray Place 2 sprays into both nostrils daily as needed for allergies or  rhinitis.    Marland Kitchen glimepiride (AMARYL) 4 MG tablet Take 4 mg by mouth daily with breakfast.     . guaiFENesin (MUCINEX) 600 MG 12 hr tablet Take 600-1,200 mg by mouth 2 (two) times daily as needed (for sinus drainage/cough/congestion).    Marland Kitchen levothyroxine (SYNTHROID, LEVOTHROID) 75 MCG tablet Take 75 mcg by mouth daily before breakfast.     . liothyronine (CYTOMEL) 5 MCG tablet Take 5 mcg by mouth daily with breakfast.     . loperamide (IMODIUM A-D) 2 MG tablet Take 2-4 mg by mouth 4 (four) times daily as needed for diarrhea or loose stools.    Marland Kitchen losartan (COZAAR) 100 MG tablet TAKE 1 TABLET BY MOUTH ONCE DAILY 90 tablet 1  . metFORMIN (GLUCOPHAGE) 1000 MG tablet Take 1 tablet (1,000 mg total) by mouth 2 (two) times daily.    . Multiple Vitamin (MULTIVITAMIN) tablet Take 1 tablet by mouth daily. Reported on 11/10/2015    . nitroGLYCERIN (NITROSTAT) 0.4 MG SL tablet DISSOLVE ONE TABLET UNDER THE TONGUE EVERY 5 MINUTES AS NEEDED FOR CHEST PAIN. (Patient not taking: Reported on 01/29/2019) 25 tablet 11  . nitroGLYCERIN (NITROSTAT) 0.4 MG SL tablet DISSOLVE ONE TABLET UNDER THE TONGUE EVERY 5 MINUTES AS NEEDED FOR CHEST PAIN. (Patient not taking: Reported on 01/29/2019) 25 tablet 11  . pantoprazole (PROTONIX) 20 MG tablet Take 1 tablet (20 mg total) by mouth daily. 90 tablet 0  . potassium citrate (UROCIT-K) 10 MEQ (1080 MG) SR tablet  Take 10 mEq by mouth 2 (two) times daily.     . rosuvastatin (CRESTOR) 10 MG tablet Take 1 tablet (10 mg total) by mouth daily. 90 tablet 3  . triamcinolone cream (KENALOG) 0.1 % Apply 1 application topically as directed.    . vitamin B-12 (CYANOCOBALAMIN) 250 MCG tablet Take 250 mcg by mouth daily.     No current facility-administered medications for this encounter.     REVIEW OF SYSTEMS:  On review of systems, the patient reports that he is doing well overall. He denies any chest pain, shortness of breath, cough, fevers, chills, night sweats, unintended weight changes. He denies any bowel disturbances, and denies abdominal pain, nausea or vomiting. He denies any new musculoskeletal or joint aches or pains. A complete review of systems is obtained and is otherwise negative.    PHYSICAL EXAM:  Wt Readings from Last 3 Encounters:  06/27/18 216 lb 3.2 oz (98.1 kg)  03/06/18 221 lb 3.2 oz (100.3 kg)  02/11/18 219 lb 9.6 oz (99.6 kg)   Temp Readings from Last 3 Encounters:  06/26/17 98 F (36.7 C)  09/20/15 98.5 F (36.9 C) (Oral)   BP Readings from Last 3 Encounters:  01/29/19 (!) 141/85  06/27/18 134/78  03/06/18 120/68   Pulse Readings from Last 3 Encounters:  01/29/19 96  06/27/18 88  03/06/18 81    /10  In general this is a well appearing Caucasian gentleman in no acute distress. He's alert and oriented x4 and appropriate throughout the examination. Cardiopulmonary assessment is negative for acute distress and he exhibits normal effort.    KPS = 90  100 - Normal; no complaints; no evidence of disease. 90   - Able to carry on normal activity; minor signs or symptoms of disease. 80   - Normal activity with effort; some signs or symptoms of disease. 74   - Cares for self; unable to carry on normal activity or to do active work. 60   - Requires occasional  assistance, but is able to care for most of his personal needs. 50   - Requires considerable assistance and frequent  medical care. 59   - Disabled; requires special care and assistance. 28   - Severely disabled; hospital admission is indicated although death not imminent. 68   - Very sick; hospital admission necessary; active supportive treatment necessary. 10   - Moribund; fatal processes progressing rapidly. 0     - Dead  Karnofsky DA, Abelmann [place], Craver LS and Burchenal [facility] [phone]) The use of the nitrogen mustards in the palliative treatment of carcinoma: with particular reference to bronchogenic carcinoma Cancer 1 634-56  LABORATORY DATA:  Lab Results  Component Value Date   WBC 5.9 06/21/2017   HGB 12.7 (L) 06/21/2017   HCT 38.2 06/21/2017   MCV 84 06/21/2017   PLT 211 06/21/2017   Lab Results  Component Value Date   NA 139 06/24/2018   K 4.3 06/24/2018   CL 100 06/24/2018   CO2 20 06/24/2018   Lab Results  Component Value Date   ALT 27 06/24/2018   AST 23 06/24/2018   ALKPHOS 42 06/24/2018   BILITOT 0.5 06/24/2018     RADIOGRAPHY: No results found.    IMPRESSION/PLAN: 1. 73 y.o. gentleman with a suspected biochemical recurrence s/p prostatectomy in 2007 for Stage pT3a N0 Mx adenocarcinoma of the prostate with Gleason Score of 3+4, and pre-treatment PSA of 1.35. This visit was conducted via MyChart to spare the patient unnecessary potential exposure in the healthcare setting during the current COVID-19 pandemic. Today we reviewed the findings and workup thus far.  We discussed the natural history of prostate cancer.  We reviewed the the implications of positive margins, extracapsular extension, and seminal vesicle involvement on the risk of prostate cancer recurrence. We reviewed some of the evidence suggesting an advantage for patients who undergo adjuvant radiotherapy in the setting in terms of disease control and overall survival. There is increasing evidence that careful surveillance with ultrasensitive PSA may provide an opportunity for early salvage in patients who undergo  observation, which can lead to excellent results in terms of disease control and survival. We discussed radiation treatment directed to the prostatic fossa with regard to the logistics and delivery of external beam radiation treatment.  He was encouraged to ask questions that were answered to his stated satisfaction.  At the end of the conversation the patient is interested in continuing with close monitoring of his PSA in active surveillance, which we believe is very reasonable. He will continue in routine follow up with Dr. Alinda Money and we look forward to following along in his care.  We enjoyed meeting him today and would be more than happy to continue to participate in his care should there be an indication for adjuvant or salvage radiotherapy in the future.  He appears to have a good understanding of his disease and our recommendations and is comfortable and in agreement with the stated plan.  We will share this discussion with Dr. Alinda Money so that he can continue to coordinate his care accordingly.  He knows that he is welcome to call at anytime with any questions or concerns related to radiation treatment.  Given current concerns for patient exposure during the COVID-19 pandemic, this encounter was conducted via video-enabled WebEx visit. The patient has given verbal consent for this type of encounter. The time spent during this encounter was 25 minutes. The attendants for this meeting include Tyler Pita MD, [address] PA-C, Katie Daubenspeck-  scribe, patient, Paul [name] and his wife. During the encounter, Tyler Pita MD, Ashlyn Bruning PA-C, and scribe, Wilburn Mylar were located at [place].  Patient, Paul [name] and his wife were located at home.    Nicholos Johns, PA-C    Tyler Pita, MD  [facility] Direct Dial: [phone]  Fax: [phone] [email or web address]  Skype  [email or web address]  This  document serves as a record of services personally performed by Tyler Pita, MD and Freeman Caldron, PA-C. It was created on their behalf by Wilburn Mylar, a trained medical scribe. The creation of this record is based on the scribe's personal observations and the provider's statements to them. This document has been checked and approved by the attending provider.

## 2019-06-23 NOTE — Consult Note (Signed)
[facility]     06/23/2019   --------------------------------------------------------------------------------   Paul [name]  [id number]  DOB: January 18, 1946, 73 year old Male  SSN: [id number]   PRIMARY CARE:  [name], MD  REFERRING:  Raynelle Bring, Eduardo Osier  PROVIDER:  Raynelle Bring, M.D.  LOCATION:  [facility] [phone]     --------------------------------------------------------------------------------   CC/HPI: CC: Prostate Cancer   PCP: Dr. [name]  Location of consult: Paul [name] is a 73 year old gentleman who was noted to have a PSA of only 1.35 but an abnormal prostate exam in 2007. He was diagnosed with prostate cancer by Dr. Nelida Gores and underwent primary surgical treatment with a UNS RAL radical prostatectomy and BPLND on 05/28/06. His pathology demonstrated a pT3a N0 Mx, Gleason 3+4=7 adenocarcinoma with negative surgical margins. Postoperatively, his PSA fluctuated initially increasing up to 0.12 at one point but subsequently decreasing to a completely undetectable level on ultrasensitive testing. His PSA then again demonstrated an upward PSA trend in late 2019. His PSA was 0.15 in October 2019, 0.13 in January 2020, and 0.22 in April 2020. His most recent PSA from 06/16/19 was 0.17.   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 He is treated with potassium citrate.   Family history: None.   Imaging studies: None.   PMH: He has a history of hypertension, diabetes, asthma, kidney stones, CAD, hyperlipidemia, GERD, and sleep apnea.  PSH: Robotic prostatectomy.   Urinary function: He does have residual incontinence and has used 1-2 pads. He has not wished to pursue surgical treatment.  Erectile function: He has erectile dysfunction that existed even prior to his prostatectomy.     ALLERGIES: morphine Penicillins Percocet  TABS Vicodin TABS    MEDICATIONS: Aspirin  Potassium Citrate Er 10 meq (1,080 mg) tablet, extended release TAKE TWO TABLETS BY MOUTH THREE TIMES DAILY  Amlodipine Besylate 5 mg tablet  Carvedilol  Centrum Silver TABS Oral  Colestid 1 gram tablet  Colestipol Hcl 1 gram tablet Oral  Co-Q 10  Cymbalta 60 mg capsule,delayed release  Ecotrin 325 mg tablet, delayed release Oral  Epipen 2-Pak 0.3 mg/0.3 ml auto-injector Injection  Escitalopram Oxalate 10 mg tablet Oral  Fish Oil CAPS Oral  Flonase 50 mcg/actuation spray, suspension Nasal  Glimepiride 4 mg tablet  Jardiance 10 mg tablet 1 tablet PO Daily  Levothyroxine Sodium 88 MCG Oral Tablet Oral  Liothyronine Sodium  Loperamide 2 mg tablet  Losartan Potassium 25 mg tablet 1 tablet PO Daily  Metformin Hcl 500 mg tablet Oral  Mucinex 600 mg tablet, extended release 12 hr Oral  Nitroglycerin SUBL Sublingual  Potassium Citrate Er 10 meq (1,080 mg) tablet, extended release 0 Oral  Prilosec 20 mg capsule,delayed release Oral  Rosuvastatin Calcium  Vitamin D3 25 mcg (1,000 unit) tablet Oral     GU PSH: Penile Injection - 2017 Robotic Radical Prostatectomy - 2008       PSH Notes: Cath Stent Placement, Cataract Surgery, Knee Surgery, Prostatect Retropubic Radical W/ Nerve Sparing Laparoscopic, heart cath 01/2018   NON-GU PSH: None   GU PMH: Prostate Cancer, Prostate cancer - 2016 Renal calculus, Nephrolithiasis - 2016 Stress Incontinence, Male stress incontinence - 2016 ED due to arterial insufficiency, Erectile dysfunction due to arterial insufficiency - 2014 Gross hematuria, Gross hematuria - 2014 History of prostate cancer, Prostate Cancer - 2014 History of urolithiasis, Nephrolithiasis - 2014 Primary hypogonadism, Hypogonadism, testicular -  2014 Ureteral calculus, Calculus of ureter - 2014 Urinary Tract Inf, Unspec site, Urinary tract infection - 2014      PMH Notes:   1) Prostate Cancer: He is s/p a UNS RAL radical  prostatectomy on May 28, 2006. His PSA initially increased as high as 0.12 but subsequently has decreased to undetectable levels without intervention.   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 His symptoms worsened for unexplained reasons after starting potassium citrate therapy and returned to baseline upon stopping this therapy. He restarted therapy understanding that his symptoms were likely unrelated.   Current treatment: PFMT/ physical therapy   3) Erectile dysfunction / testosterone deficiency: His baseline testosterone is 205 ng/dL. He has been counseled about the risks and benefits of testosterone replacement therapy in the setting of a history of prostate cancer. He did undergo a trial of testosterone replacement due to symptoms of fatigue. He did not receive significant benefit although his testosterone levels never reached target levels.   Pretreatment SHIM: 11  Current treatment: None  Prior treatment: Androgel (4 pumps daily)   4) History of urinary tract infection: He has undergone an evaluation including CT and cysto due to bacterial persistence which did not demonstrate any findings to explain his bacteriuria. His UTI subsequently cleared after a 2 week course of cipro.   July 2008: Klebsiella S to Cipro  Sept 2009: Pansensitive E. Coli S to Cipro   5) Uric acid urolithiasis: He developed urolithiasis in 2012 and passed a couple of stones spontaneously which were found to be uric acid.   Current treatment: Potassium citrate 20 m Eq po tid (started again Dec 2015)  Prior treatment: Postassium citrate (stopped due to worsening incontinence)   Sep 2012: Passed two stones (uric acid)   6) Questionable right renal mass: There was noted to be an incidentally detected new 2.1 cm heterogeneous cystic mass on the lower pole of the right  kidney on ultrasound in December 2014.     NON-GU PMH: Anxiety, Anxiety - 2014 Asthma, Asthma - 2014 Other fatigue, Fatigue - 2014 Personal history of other diseases of the digestive system, History of esophageal reflux - 2014 Personal history of other endocrine, nutritional and metabolic disease, History of diabetes mellitus - 2014 Personal history of other mental and behavioral disorders, History of depression - 2014 Coronary Artery Disease Hypercholesterolemia Hypertension Sleep Apnea    FAMILY HISTORY: Acute Myocardial Infarction - Father Arthritis - Mother Diabetes - Sister, Runs In Family   SOCIAL HISTORY: Marital Status: Married Preferred Language: English; Ethnicity: Not Hispanic Or Latino; Race: White Current Smoking Status: Patient has never smoked.  Drinks 1 caffeinated drink per day.     Notes: Never A Smoker, Occupation:, Marital History - Currently Married, Alcohol Use, Tobacco Use   REVIEW OF SYSTEMS:    GU Review Male:   Patient denies frequent urination, hard to postpone urination, burning/ pain with urination, get up at night to urinate, leakage of urine, stream starts and stops, trouble starting your streams, and have to strain to urinate .  Gastrointestinal (Upper):   Patient denies nausea and vomiting.  Gastrointestinal (Lower):   Patient denies diarrhea and constipation.  Constitutional:   Patient denies fever, night sweats, weight loss, and fatigue.  Skin:   Patient denies skin rash/ lesion and itching.  Eyes:   Patient denies blurred vision  and double vision.  Ears/ Nose/ Throat:   Patient denies sore throat and sinus problems.  Hematologic/Lymphatic:   Patient denies swollen glands and easy bruising.  Cardiovascular:   Patient denies leg swelling and chest pains.  Respiratory:   Patient denies cough and shortness of breath.  Endocrine:   Patient denies excessive thirst.  Musculoskeletal:   Patient denies back pain and joint pain.  Neurological:    Patient denies headaches and dizziness.  Psychologic:   Patient denies depression and anxiety.   VITAL SIGNS: None   MULTI-SYSTEM PHYSICAL EXAMINATION:    Constitutional: Well-nourished. No physical deformities. Normally developed. Good grooming.     PAST DATA REVIEWED:  Source Of History:  Patient  Lab Test Review:   PSA  Records Review:   Pathology Reports   02/20/19 11/25/18 08/27/18 08/20/18 08/07/17 08/08/16 01/05/16 10/07/14  PSA  Total PSA 0.22 ng/mL 0.13 ng/mL 0.14 ng/mL 0.151 ng/mL 0.056 ng/mL 0.03 ng/dl 0.06  0.03     02/12/12 01/03/12 11/23/11 10/01/11 08/20/11 08/09/11  Hormones  Testosterone, Total 70.15  263.30  320.65  271.13  234.01  205.17     PROCEDURES:          Teleheatlh This patient encounter is appropriate and reasonable under the circumstances given the patient's particular presentation at this time. The patient has been advised of the potential risks and limitations of this mode of treatment (including, but not limited to, the absence of in-person examination) and has agreed to be treated in a remote fashion in spite of them.   Any and all of the patient's/patient's family's questions on this issue have been answered, and I have made no promises or guarantees to the patient. The patient has also been advised to contact this office for worsening conditions or problems, and seek emergency medical treatment and/or call 911 if the patient deems either necessary.    ASSESSMENT:      ICD-10 Details  1 GU:   Prostate Cancer - C61    PLAN:           Document Letter(s):  Created for Patient: Clinical Summary         Notes:   1. Prostate cancer with possible biochemical recurrence: I had a long discussion with him and his wife today. We discussed his recent PSA which has decreased to 0.17. As such, it is difficult to say whether he has developed a true biochemical recurrence at this time. However, we again reviewed the potential options including salvage  radiation therapy versus ongoing surveillance monitoring with plans to consider systemic therapy in the future if needed. He does have baseline incontinence and is aware that this could worsen with salvage radiation therapy. He is scheduled to meet with both Dr. Tammi Klippel in Dr. Alen Blew today to review his options. Tentatively, however, we will plan to have him follow up in 3 months with a repeat PSA to further assess whether he is truly developing a biochemical recurrence or not. All questions were answered to his stated satisfaction.   Cc: Dr. [name]  Dr. Zola Button  Dr. Tyler Pita    E & M CODE: I spent at least 26 minutes face to face with the patient, more than 50% of that time was spent on counseling and/or coordinating care.

## 2019-06-26 DIAGNOSIS — C61 Malignant neoplasm of prostate: Secondary | ICD-10-CM | POA: Insufficient documentation

## 2019-07-01 DIAGNOSIS — M25572 Pain in left ankle and joints of left foot: Secondary | ICD-10-CM | POA: Diagnosis not present

## 2019-07-02 ENCOUNTER — Other Ambulatory Visit: Payer: Self-pay | Admitting: Cardiology

## 2019-07-08 DIAGNOSIS — I2510 Atherosclerotic heart disease of native coronary artery without angina pectoris: Secondary | ICD-10-CM | POA: Diagnosis not present

## 2019-07-08 DIAGNOSIS — E039 Hypothyroidism, unspecified: Secondary | ICD-10-CM | POA: Diagnosis not present

## 2019-07-08 DIAGNOSIS — E785 Hyperlipidemia, unspecified: Secondary | ICD-10-CM | POA: Diagnosis not present

## 2019-07-08 DIAGNOSIS — E1165 Type 2 diabetes mellitus with hyperglycemia: Secondary | ICD-10-CM | POA: Diagnosis not present

## 2019-07-08 DIAGNOSIS — N183 Chronic kidney disease, stage 3 (moderate): Secondary | ICD-10-CM | POA: Diagnosis not present

## 2019-07-08 DIAGNOSIS — I10 Essential (primary) hypertension: Secondary | ICD-10-CM | POA: Diagnosis not present

## 2019-07-17 DIAGNOSIS — I252 Old myocardial infarction: Secondary | ICD-10-CM | POA: Diagnosis not present

## 2019-07-17 DIAGNOSIS — F325 Major depressive disorder, single episode, in full remission: Secondary | ICD-10-CM | POA: Diagnosis not present

## 2019-07-17 DIAGNOSIS — F324 Major depressive disorder, single episode, in partial remission: Secondary | ICD-10-CM | POA: Diagnosis not present

## 2019-07-17 DIAGNOSIS — I2510 Atherosclerotic heart disease of native coronary artery without angina pectoris: Secondary | ICD-10-CM | POA: Diagnosis not present

## 2019-07-17 DIAGNOSIS — I10 Essential (primary) hypertension: Secondary | ICD-10-CM | POA: Diagnosis not present

## 2019-07-17 DIAGNOSIS — E039 Hypothyroidism, unspecified: Secondary | ICD-10-CM | POA: Diagnosis not present

## 2019-07-17 DIAGNOSIS — E785 Hyperlipidemia, unspecified: Secondary | ICD-10-CM | POA: Diagnosis not present

## 2019-07-17 DIAGNOSIS — Z8546 Personal history of malignant neoplasm of prostate: Secondary | ICD-10-CM | POA: Diagnosis not present

## 2019-07-17 DIAGNOSIS — E1165 Type 2 diabetes mellitus with hyperglycemia: Secondary | ICD-10-CM | POA: Diagnosis not present

## 2019-07-17 DIAGNOSIS — N183 Chronic kidney disease, stage 3 (moderate): Secondary | ICD-10-CM | POA: Diagnosis not present

## 2019-07-17 DIAGNOSIS — E1122 Type 2 diabetes mellitus with diabetic chronic kidney disease: Secondary | ICD-10-CM | POA: Diagnosis not present

## 2019-08-06 ENCOUNTER — Ambulatory Visit (HOSPITAL_COMMUNITY)
Admission: RE | Admit: 2019-08-06 | Discharge: 2019-08-06 | Disposition: A | Discharge status: Home / Self Care | Payer: PPO | Source: Ambulatory Visit | Attending: Cardiology | Admitting: Cardiology

## 2019-08-06 ENCOUNTER — Other Ambulatory Visit (HOSPITAL_COMMUNITY): Payer: Self-pay | Admitting: Cardiology

## 2019-08-06 ENCOUNTER — Other Ambulatory Visit: Payer: Self-pay

## 2019-08-06 DIAGNOSIS — I6523 Occlusion and stenosis of bilateral carotid arteries: Secondary | ICD-10-CM | POA: Diagnosis not present

## 2019-08-10 ENCOUNTER — Other Ambulatory Visit: Payer: Self-pay | Admitting: Cardiology

## 2019-08-12 ENCOUNTER — Other Ambulatory Visit: Payer: Self-pay

## 2019-08-12 ENCOUNTER — Telehealth: Payer: Self-pay | Admitting: Cardiology

## 2019-08-12 DIAGNOSIS — I6523 Occlusion and stenosis of bilateral carotid arteries: Secondary | ICD-10-CM

## 2019-08-12 MED ORDER — LOSARTAN POTASSIUM 100 MG PO TABS: ORAL_TABLET | ORAL | 3 refills | Status: DC

## 2019-08-12 NOTE — Telephone Encounter (Signed)
Called patient and LVM for patient to call and schedule carotid test for 2021.

## 2019-08-12 NOTE — Progress Notes (Signed)
car

## 2019-08-28 DIAGNOSIS — E785 Hyperlipidemia, unspecified: Secondary | ICD-10-CM | POA: Diagnosis not present

## 2019-08-28 DIAGNOSIS — I252 Old myocardial infarction: Secondary | ICD-10-CM | POA: Diagnosis not present

## 2019-08-28 DIAGNOSIS — I10 Essential (primary) hypertension: Secondary | ICD-10-CM | POA: Diagnosis not present

## 2019-08-28 DIAGNOSIS — N183 Chronic kidney disease, stage 3 unspecified: Secondary | ICD-10-CM | POA: Diagnosis not present

## 2019-08-28 DIAGNOSIS — F325 Major depressive disorder, single episode, in full remission: Secondary | ICD-10-CM | POA: Diagnosis not present

## 2019-08-28 DIAGNOSIS — F324 Major depressive disorder, single episode, in partial remission: Secondary | ICD-10-CM | POA: Diagnosis not present

## 2019-08-28 DIAGNOSIS — E039 Hypothyroidism, unspecified: Secondary | ICD-10-CM | POA: Diagnosis not present

## 2019-08-28 DIAGNOSIS — I2510 Atherosclerotic heart disease of native coronary artery without angina pectoris: Secondary | ICD-10-CM | POA: Diagnosis not present

## 2019-08-28 DIAGNOSIS — Z8546 Personal history of malignant neoplasm of prostate: Secondary | ICD-10-CM | POA: Diagnosis not present

## 2019-08-28 DIAGNOSIS — E1122 Type 2 diabetes mellitus with diabetic chronic kidney disease: Secondary | ICD-10-CM | POA: Diagnosis not present

## 2019-08-28 DIAGNOSIS — E1165 Type 2 diabetes mellitus with hyperglycemia: Secondary | ICD-10-CM | POA: Diagnosis not present

## 2019-09-14 DIAGNOSIS — Z8546 Personal history of malignant neoplasm of prostate: Secondary | ICD-10-CM | POA: Diagnosis not present

## 2019-09-14 DIAGNOSIS — I10 Essential (primary) hypertension: Secondary | ICD-10-CM | POA: Diagnosis not present

## 2019-09-14 DIAGNOSIS — E785 Hyperlipidemia, unspecified: Secondary | ICD-10-CM | POA: Diagnosis not present

## 2019-09-14 DIAGNOSIS — F325 Major depressive disorder, single episode, in full remission: Secondary | ICD-10-CM | POA: Diagnosis not present

## 2019-09-14 DIAGNOSIS — F324 Major depressive disorder, single episode, in partial remission: Secondary | ICD-10-CM | POA: Diagnosis not present

## 2019-09-14 DIAGNOSIS — I2510 Atherosclerotic heart disease of native coronary artery without angina pectoris: Secondary | ICD-10-CM | POA: Diagnosis not present

## 2019-09-14 DIAGNOSIS — E1165 Type 2 diabetes mellitus with hyperglycemia: Secondary | ICD-10-CM | POA: Diagnosis not present

## 2019-09-14 DIAGNOSIS — I252 Old myocardial infarction: Secondary | ICD-10-CM | POA: Diagnosis not present

## 2019-09-14 DIAGNOSIS — E039 Hypothyroidism, unspecified: Secondary | ICD-10-CM | POA: Diagnosis not present

## 2019-09-14 DIAGNOSIS — E1122 Type 2 diabetes mellitus with diabetic chronic kidney disease: Secondary | ICD-10-CM | POA: Diagnosis not present

## 2019-09-22 DIAGNOSIS — G4733 Obstructive sleep apnea (adult) (pediatric): Secondary | ICD-10-CM | POA: Diagnosis not present

## 2019-10-08 DIAGNOSIS — E039 Hypothyroidism, unspecified: Secondary | ICD-10-CM | POA: Diagnosis not present

## 2019-10-08 DIAGNOSIS — I10 Essential (primary) hypertension: Secondary | ICD-10-CM | POA: Diagnosis not present

## 2019-10-08 DIAGNOSIS — E785 Hyperlipidemia, unspecified: Secondary | ICD-10-CM | POA: Diagnosis not present

## 2019-10-08 DIAGNOSIS — K219 Gastro-esophageal reflux disease without esophagitis: Secondary | ICD-10-CM | POA: Diagnosis not present

## 2019-10-08 DIAGNOSIS — F325 Major depressive disorder, single episode, in full remission: Secondary | ICD-10-CM | POA: Diagnosis not present

## 2019-10-08 DIAGNOSIS — I2510 Atherosclerotic heart disease of native coronary artery without angina pectoris: Secondary | ICD-10-CM | POA: Diagnosis not present

## 2019-10-08 DIAGNOSIS — E1122 Type 2 diabetes mellitus with diabetic chronic kidney disease: Secondary | ICD-10-CM | POA: Diagnosis not present

## 2019-10-08 DIAGNOSIS — N183 Chronic kidney disease, stage 3 unspecified: Secondary | ICD-10-CM | POA: Diagnosis not present

## 2019-10-13 DIAGNOSIS — I10 Essential (primary) hypertension: Secondary | ICD-10-CM | POA: Diagnosis not present

## 2019-10-13 DIAGNOSIS — Z79899 Other long term (current) drug therapy: Secondary | ICD-10-CM | POA: Diagnosis not present

## 2019-10-13 DIAGNOSIS — E1122 Type 2 diabetes mellitus with diabetic chronic kidney disease: Secondary | ICD-10-CM | POA: Diagnosis not present

## 2019-10-13 DIAGNOSIS — F324 Major depressive disorder, single episode, in partial remission: Secondary | ICD-10-CM | POA: Diagnosis not present

## 2019-10-13 DIAGNOSIS — E1142 Type 2 diabetes mellitus with diabetic polyneuropathy: Secondary | ICD-10-CM | POA: Diagnosis not present

## 2019-10-13 DIAGNOSIS — E039 Hypothyroidism, unspecified: Secondary | ICD-10-CM | POA: Diagnosis not present

## 2019-10-13 DIAGNOSIS — M159 Polyosteoarthritis, unspecified: Secondary | ICD-10-CM | POA: Diagnosis not present

## 2019-10-27 DIAGNOSIS — G4733 Obstructive sleep apnea (adult) (pediatric): Secondary | ICD-10-CM | POA: Diagnosis not present

## 2019-11-02 DIAGNOSIS — I10 Essential (primary) hypertension: Secondary | ICD-10-CM | POA: Diagnosis not present

## 2019-11-05 DIAGNOSIS — H903 Sensorineural hearing loss, bilateral: Secondary | ICD-10-CM | POA: Diagnosis not present

## 2019-11-15 ENCOUNTER — Other Ambulatory Visit: Payer: Self-pay | Admitting: Cardiology

## 2019-11-17 ENCOUNTER — Telehealth: Payer: Self-pay | Admitting: Cardiology

## 2019-11-17 DIAGNOSIS — F324 Major depressive disorder, single episode, in partial remission: Secondary | ICD-10-CM | POA: Diagnosis not present

## 2019-11-17 DIAGNOSIS — I10 Essential (primary) hypertension: Secondary | ICD-10-CM | POA: Diagnosis not present

## 2019-11-17 DIAGNOSIS — M159 Polyosteoarthritis, unspecified: Secondary | ICD-10-CM | POA: Diagnosis not present

## 2019-11-17 DIAGNOSIS — E1165 Type 2 diabetes mellitus with hyperglycemia: Secondary | ICD-10-CM | POA: Diagnosis not present

## 2019-11-17 DIAGNOSIS — E039 Hypothyroidism, unspecified: Secondary | ICD-10-CM | POA: Diagnosis not present

## 2019-11-17 DIAGNOSIS — E1122 Type 2 diabetes mellitus with diabetic chronic kidney disease: Secondary | ICD-10-CM | POA: Diagnosis not present

## 2019-11-17 DIAGNOSIS — I252 Old myocardial infarction: Secondary | ICD-10-CM | POA: Diagnosis not present

## 2019-11-17 DIAGNOSIS — I2510 Atherosclerotic heart disease of native coronary artery without angina pectoris: Secondary | ICD-10-CM | POA: Diagnosis not present

## 2019-11-17 DIAGNOSIS — E785 Hyperlipidemia, unspecified: Secondary | ICD-10-CM | POA: Diagnosis not present

## 2019-11-17 DIAGNOSIS — F325 Major depressive disorder, single episode, in full remission: Secondary | ICD-10-CM | POA: Diagnosis not present

## 2019-11-17 DIAGNOSIS — E1142 Type 2 diabetes mellitus with diabetic polyneuropathy: Secondary | ICD-10-CM | POA: Diagnosis not present

## 2019-11-17 DIAGNOSIS — Z8546 Personal history of malignant neoplasm of prostate: Secondary | ICD-10-CM | POA: Diagnosis not present

## 2019-11-17 NOTE — Telephone Encounter (Signed)
Will forward to Dr Martinique for review

## 2019-11-17 NOTE — Telephone Encounter (Signed)
Rx(s) sent to pharmacy electronically.  

## 2019-11-17 NOTE — Telephone Encounter (Signed)
Caryl Pina from [facility] calling stating the pharmacist at [facility] was requesting to increase the patient's amLODipine (NORVASC) 5 MG tablet to 10 mg because his BP has been high. She states it has been averaging 141 and the highest has been 156.

## 2019-11-17 NOTE — Telephone Encounter (Signed)
Spoke to [employer] with [employer].Advised Dr.Jordan ok with increasing Amlodipine to 10 mg daily.

## 2019-11-17 NOTE — Telephone Encounter (Signed)
OK with me  Royal Vandevoort MD, FACC   

## 2019-11-27 DIAGNOSIS — G4733 Obstructive sleep apnea (adult) (pediatric): Secondary | ICD-10-CM | POA: Diagnosis not present

## 2019-11-27 DIAGNOSIS — I10 Essential (primary) hypertension: Secondary | ICD-10-CM | POA: Diagnosis not present

## 2019-12-11 DIAGNOSIS — C61 Malignant neoplasm of prostate: Secondary | ICD-10-CM | POA: Diagnosis not present

## 2019-12-25 DIAGNOSIS — E039 Hypothyroidism, unspecified: Secondary | ICD-10-CM | POA: Diagnosis not present

## 2019-12-25 DIAGNOSIS — F324 Major depressive disorder, single episode, in partial remission: Secondary | ICD-10-CM | POA: Diagnosis not present

## 2019-12-25 DIAGNOSIS — E1122 Type 2 diabetes mellitus with diabetic chronic kidney disease: Secondary | ICD-10-CM | POA: Diagnosis not present

## 2019-12-25 DIAGNOSIS — E1142 Type 2 diabetes mellitus with diabetic polyneuropathy: Secondary | ICD-10-CM | POA: Diagnosis not present

## 2019-12-25 DIAGNOSIS — M159 Polyosteoarthritis, unspecified: Secondary | ICD-10-CM | POA: Diagnosis not present

## 2019-12-25 DIAGNOSIS — F325 Major depressive disorder, single episode, in full remission: Secondary | ICD-10-CM | POA: Diagnosis not present

## 2019-12-25 DIAGNOSIS — I2510 Atherosclerotic heart disease of native coronary artery without angina pectoris: Secondary | ICD-10-CM | POA: Diagnosis not present

## 2019-12-25 DIAGNOSIS — Z8546 Personal history of malignant neoplasm of prostate: Secondary | ICD-10-CM | POA: Diagnosis not present

## 2019-12-25 DIAGNOSIS — I252 Old myocardial infarction: Secondary | ICD-10-CM | POA: Diagnosis not present

## 2019-12-25 DIAGNOSIS — E785 Hyperlipidemia, unspecified: Secondary | ICD-10-CM | POA: Diagnosis not present

## 2019-12-25 DIAGNOSIS — I10 Essential (primary) hypertension: Secondary | ICD-10-CM | POA: Diagnosis not present

## 2019-12-25 DIAGNOSIS — E1165 Type 2 diabetes mellitus with hyperglycemia: Secondary | ICD-10-CM | POA: Diagnosis not present

## 2019-12-27 DIAGNOSIS — G4733 Obstructive sleep apnea (adult) (pediatric): Secondary | ICD-10-CM | POA: Diagnosis not present

## 2020-01-05 DIAGNOSIS — E039 Hypothyroidism, unspecified: Secondary | ICD-10-CM | POA: Diagnosis not present

## 2020-01-05 DIAGNOSIS — I10 Essential (primary) hypertension: Secondary | ICD-10-CM | POA: Diagnosis not present

## 2020-01-05 DIAGNOSIS — M159 Polyosteoarthritis, unspecified: Secondary | ICD-10-CM | POA: Diagnosis not present

## 2020-01-05 DIAGNOSIS — Z8546 Personal history of malignant neoplasm of prostate: Secondary | ICD-10-CM | POA: Diagnosis not present

## 2020-01-05 DIAGNOSIS — I2510 Atherosclerotic heart disease of native coronary artery without angina pectoris: Secondary | ICD-10-CM | POA: Diagnosis not present

## 2020-01-05 DIAGNOSIS — E1165 Type 2 diabetes mellitus with hyperglycemia: Secondary | ICD-10-CM | POA: Diagnosis not present

## 2020-01-05 DIAGNOSIS — E785 Hyperlipidemia, unspecified: Secondary | ICD-10-CM | POA: Diagnosis not present

## 2020-01-05 DIAGNOSIS — I252 Old myocardial infarction: Secondary | ICD-10-CM | POA: Diagnosis not present

## 2020-01-05 DIAGNOSIS — E1122 Type 2 diabetes mellitus with diabetic chronic kidney disease: Secondary | ICD-10-CM | POA: Diagnosis not present

## 2020-01-05 DIAGNOSIS — F324 Major depressive disorder, single episode, in partial remission: Secondary | ICD-10-CM | POA: Diagnosis not present

## 2020-01-05 DIAGNOSIS — E1142 Type 2 diabetes mellitus with diabetic polyneuropathy: Secondary | ICD-10-CM | POA: Diagnosis not present

## 2020-01-06 DIAGNOSIS — Z7984 Long term (current) use of oral hypoglycemic drugs: Secondary | ICD-10-CM | POA: Diagnosis not present

## 2020-01-06 DIAGNOSIS — F324 Major depressive disorder, single episode, in partial remission: Secondary | ICD-10-CM | POA: Diagnosis not present

## 2020-01-06 DIAGNOSIS — E1122 Type 2 diabetes mellitus with diabetic chronic kidney disease: Secondary | ICD-10-CM | POA: Diagnosis not present

## 2020-01-06 DIAGNOSIS — E039 Hypothyroidism, unspecified: Secondary | ICD-10-CM | POA: Diagnosis not present

## 2020-01-06 DIAGNOSIS — M159 Polyosteoarthritis, unspecified: Secondary | ICD-10-CM | POA: Diagnosis not present

## 2020-01-06 DIAGNOSIS — Z79899 Other long term (current) drug therapy: Secondary | ICD-10-CM | POA: Diagnosis not present

## 2020-01-06 DIAGNOSIS — I10 Essential (primary) hypertension: Secondary | ICD-10-CM | POA: Diagnosis not present

## 2020-01-08 DIAGNOSIS — C61 Malignant neoplasm of prostate: Secondary | ICD-10-CM | POA: Diagnosis not present

## 2020-01-11 DIAGNOSIS — Z8546 Personal history of malignant neoplasm of prostate: Secondary | ICD-10-CM | POA: Diagnosis not present

## 2020-01-11 DIAGNOSIS — E039 Hypothyroidism, unspecified: Secondary | ICD-10-CM | POA: Diagnosis not present

## 2020-01-11 DIAGNOSIS — I2510 Atherosclerotic heart disease of native coronary artery without angina pectoris: Secondary | ICD-10-CM | POA: Diagnosis not present

## 2020-01-11 DIAGNOSIS — I10 Essential (primary) hypertension: Secondary | ICD-10-CM | POA: Diagnosis not present

## 2020-01-11 DIAGNOSIS — E1122 Type 2 diabetes mellitus with diabetic chronic kidney disease: Secondary | ICD-10-CM | POA: Diagnosis not present

## 2020-01-11 DIAGNOSIS — Z91030 Bee allergy status: Secondary | ICD-10-CM | POA: Diagnosis not present

## 2020-01-11 DIAGNOSIS — E785 Hyperlipidemia, unspecified: Secondary | ICD-10-CM | POA: Diagnosis not present

## 2020-01-11 DIAGNOSIS — F324 Major depressive disorder, single episode, in partial remission: Secondary | ICD-10-CM | POA: Diagnosis not present

## 2020-01-11 DIAGNOSIS — K219 Gastro-esophageal reflux disease without esophagitis: Secondary | ICD-10-CM | POA: Diagnosis not present

## 2020-01-15 DIAGNOSIS — N393 Stress incontinence (female) (male): Secondary | ICD-10-CM | POA: Diagnosis not present

## 2020-01-15 DIAGNOSIS — Z87442 Personal history of urinary calculi: Secondary | ICD-10-CM | POA: Diagnosis not present

## 2020-01-15 DIAGNOSIS — C61 Malignant neoplasm of prostate: Secondary | ICD-10-CM | POA: Diagnosis not present

## 2020-01-25 DIAGNOSIS — G4733 Obstructive sleep apnea (adult) (pediatric): Secondary | ICD-10-CM | POA: Diagnosis not present

## 2020-01-27 DIAGNOSIS — I10 Essential (primary) hypertension: Secondary | ICD-10-CM | POA: Diagnosis not present

## 2020-02-25 DIAGNOSIS — G4733 Obstructive sleep apnea (adult) (pediatric): Secondary | ICD-10-CM | POA: Diagnosis not present

## 2020-02-26 DIAGNOSIS — I10 Essential (primary) hypertension: Secondary | ICD-10-CM | POA: Diagnosis not present

## 2020-03-13 ENCOUNTER — Other Ambulatory Visit: Payer: Self-pay | Admitting: Cardiology

## 2020-03-16 DIAGNOSIS — I2510 Atherosclerotic heart disease of native coronary artery without angina pectoris: Secondary | ICD-10-CM | POA: Diagnosis not present

## 2020-03-16 DIAGNOSIS — N183 Chronic kidney disease, stage 3 unspecified: Secondary | ICD-10-CM | POA: Diagnosis not present

## 2020-03-16 DIAGNOSIS — I10 Essential (primary) hypertension: Secondary | ICD-10-CM | POA: Diagnosis not present

## 2020-03-16 DIAGNOSIS — E785 Hyperlipidemia, unspecified: Secondary | ICD-10-CM | POA: Diagnosis not present

## 2020-03-16 DIAGNOSIS — E039 Hypothyroidism, unspecified: Secondary | ICD-10-CM | POA: Diagnosis not present

## 2020-03-16 DIAGNOSIS — M159 Polyosteoarthritis, unspecified: Secondary | ICD-10-CM | POA: Diagnosis not present

## 2020-03-16 DIAGNOSIS — E1165 Type 2 diabetes mellitus with hyperglycemia: Secondary | ICD-10-CM | POA: Diagnosis not present

## 2020-03-16 DIAGNOSIS — I252 Old myocardial infarction: Secondary | ICD-10-CM | POA: Diagnosis not present

## 2020-03-16 DIAGNOSIS — E1142 Type 2 diabetes mellitus with diabetic polyneuropathy: Secondary | ICD-10-CM | POA: Diagnosis not present

## 2020-03-16 DIAGNOSIS — E1122 Type 2 diabetes mellitus with diabetic chronic kidney disease: Secondary | ICD-10-CM | POA: Diagnosis not present

## 2020-03-16 DIAGNOSIS — F324 Major depressive disorder, single episode, in partial remission: Secondary | ICD-10-CM | POA: Diagnosis not present

## 2020-03-16 DIAGNOSIS — F325 Major depressive disorder, single episode, in full remission: Secondary | ICD-10-CM | POA: Diagnosis not present

## 2020-03-17 DIAGNOSIS — H903 Sensorineural hearing loss, bilateral: Secondary | ICD-10-CM | POA: Diagnosis not present

## 2020-03-18 DIAGNOSIS — L509 Urticaria, unspecified: Secondary | ICD-10-CM | POA: Diagnosis not present

## 2020-03-25 DIAGNOSIS — I10 Essential (primary) hypertension: Secondary | ICD-10-CM | POA: Diagnosis not present

## 2020-03-26 DIAGNOSIS — G4733 Obstructive sleep apnea (adult) (pediatric): Secondary | ICD-10-CM | POA: Diagnosis not present

## 2020-04-11 DIAGNOSIS — N183 Chronic kidney disease, stage 3 unspecified: Secondary | ICD-10-CM | POA: Diagnosis not present

## 2020-04-11 DIAGNOSIS — I252 Old myocardial infarction: Secondary | ICD-10-CM | POA: Diagnosis not present

## 2020-04-11 DIAGNOSIS — F325 Major depressive disorder, single episode, in full remission: Secondary | ICD-10-CM | POA: Diagnosis not present

## 2020-04-11 DIAGNOSIS — E1165 Type 2 diabetes mellitus with hyperglycemia: Secondary | ICD-10-CM | POA: Diagnosis not present

## 2020-04-11 DIAGNOSIS — E1122 Type 2 diabetes mellitus with diabetic chronic kidney disease: Secondary | ICD-10-CM | POA: Diagnosis not present

## 2020-04-11 DIAGNOSIS — E1142 Type 2 diabetes mellitus with diabetic polyneuropathy: Secondary | ICD-10-CM | POA: Diagnosis not present

## 2020-04-11 DIAGNOSIS — I2510 Atherosclerotic heart disease of native coronary artery without angina pectoris: Secondary | ICD-10-CM | POA: Diagnosis not present

## 2020-04-11 DIAGNOSIS — E039 Hypothyroidism, unspecified: Secondary | ICD-10-CM | POA: Diagnosis not present

## 2020-04-11 DIAGNOSIS — I10 Essential (primary) hypertension: Secondary | ICD-10-CM | POA: Diagnosis not present

## 2020-04-11 DIAGNOSIS — E785 Hyperlipidemia, unspecified: Secondary | ICD-10-CM | POA: Diagnosis not present

## 2020-04-11 DIAGNOSIS — F324 Major depressive disorder, single episode, in partial remission: Secondary | ICD-10-CM | POA: Diagnosis not present

## 2020-04-14 DIAGNOSIS — C61 Malignant neoplasm of prostate: Secondary | ICD-10-CM | POA: Diagnosis not present

## 2020-04-26 DIAGNOSIS — G4733 Obstructive sleep apnea (adult) (pediatric): Secondary | ICD-10-CM | POA: Diagnosis not present

## 2020-05-19 DIAGNOSIS — G43109 Migraine with aura, not intractable, without status migrainosus: Secondary | ICD-10-CM | POA: Diagnosis not present

## 2020-05-19 DIAGNOSIS — E119 Type 2 diabetes mellitus without complications: Secondary | ICD-10-CM | POA: Diagnosis not present

## 2020-05-19 DIAGNOSIS — Z961 Presence of intraocular lens: Secondary | ICD-10-CM | POA: Diagnosis not present

## 2020-05-20 DIAGNOSIS — I10 Essential (primary) hypertension: Secondary | ICD-10-CM | POA: Diagnosis not present

## 2020-05-20 DIAGNOSIS — F325 Major depressive disorder, single episode, in full remission: Secondary | ICD-10-CM | POA: Diagnosis not present

## 2020-05-20 DIAGNOSIS — Z7984 Long term (current) use of oral hypoglycemic drugs: Secondary | ICD-10-CM | POA: Diagnosis not present

## 2020-05-20 DIAGNOSIS — E1122 Type 2 diabetes mellitus with diabetic chronic kidney disease: Secondary | ICD-10-CM | POA: Diagnosis not present

## 2020-05-20 DIAGNOSIS — Z0000 Encounter for general adult medical examination without abnormal findings: Secondary | ICD-10-CM | POA: Diagnosis not present

## 2020-05-26 DIAGNOSIS — G4733 Obstructive sleep apnea (adult) (pediatric): Secondary | ICD-10-CM | POA: Diagnosis not present

## 2020-05-27 DIAGNOSIS — I10 Essential (primary) hypertension: Secondary | ICD-10-CM | POA: Diagnosis not present

## 2020-06-03 DIAGNOSIS — I2510 Atherosclerotic heart disease of native coronary artery without angina pectoris: Secondary | ICD-10-CM | POA: Diagnosis not present

## 2020-06-03 DIAGNOSIS — E039 Hypothyroidism, unspecified: Secondary | ICD-10-CM | POA: Diagnosis not present

## 2020-06-03 DIAGNOSIS — N183 Chronic kidney disease, stage 3 unspecified: Secondary | ICD-10-CM | POA: Diagnosis not present

## 2020-06-03 DIAGNOSIS — E1122 Type 2 diabetes mellitus with diabetic chronic kidney disease: Secondary | ICD-10-CM | POA: Diagnosis not present

## 2020-06-03 DIAGNOSIS — F324 Major depressive disorder, single episode, in partial remission: Secondary | ICD-10-CM | POA: Diagnosis not present

## 2020-06-03 DIAGNOSIS — E785 Hyperlipidemia, unspecified: Secondary | ICD-10-CM | POA: Diagnosis not present

## 2020-06-03 DIAGNOSIS — I252 Old myocardial infarction: Secondary | ICD-10-CM | POA: Diagnosis not present

## 2020-06-03 DIAGNOSIS — I10 Essential (primary) hypertension: Secondary | ICD-10-CM | POA: Diagnosis not present

## 2020-06-03 DIAGNOSIS — E1165 Type 2 diabetes mellitus with hyperglycemia: Secondary | ICD-10-CM | POA: Diagnosis not present

## 2020-06-03 DIAGNOSIS — F325 Major depressive disorder, single episode, in full remission: Secondary | ICD-10-CM | POA: Diagnosis not present

## 2020-06-03 DIAGNOSIS — E1142 Type 2 diabetes mellitus with diabetic polyneuropathy: Secondary | ICD-10-CM | POA: Diagnosis not present

## 2020-06-09 DIAGNOSIS — G4733 Obstructive sleep apnea (adult) (pediatric): Secondary | ICD-10-CM | POA: Diagnosis not present

## 2020-06-18 ENCOUNTER — Other Ambulatory Visit: Payer: Self-pay | Admitting: Cardiology

## 2020-06-20 ENCOUNTER — Telehealth: Payer: Self-pay | Admitting: Cardiology

## 2020-06-20 NOTE — Telephone Encounter (Signed)
   Primary Cardiologist: Dr Martinique  Chart reviewed and patient contacted by phone today as part of pre-operative protocol coverage. Given past medical history and time since last visit, based on ACC/AHA guidelines, WILLMAR STOCKINGER would be at acceptable risk for the planned procedure without further cardiovascular testing.   We would prefer the patient remain on aspirin 81 mg but OK to hold 3-5 days pre op if needed.  He does not need SBE prophylaxis.   I will route this recommendation to the requesting party via Epic fax function and remove from pre-op pool.  Please call with questions.  Kerin Ransom, PA-C 06/20/2020, 4:24 PM

## 2020-06-20 NOTE — Telephone Encounter (Signed)
1. What dental office are you calling from? [facility]  2. What is your office phone number? [phone]  3. What is your fax number? [phone]  4. What type of procedure is the patient having performed? Extracting 4 teeth and placing four implants    5. What date is procedure scheduled or is the patient there now? 06/23/20 (if the patient is at the dentist's office question goes to their cardiologist if he/she is in the office.  If not, question should go to the DOD).   6. What is your question (ex. Antibiotics prior to procedure, holding medication-we need to know how long dentist wants pt to hold med)? If any blood thinners if on any need to held and if antibiotics are need as well as the pt's current cardiac stability

## 2020-06-20 NOTE — Telephone Encounter (Signed)
Called and spoke to patient. He states that he is going to be having an upcoming dental procedure and is asking about holding ASA. Made patient aware that if they would like for him to hold ASA or need clearance then they will have to fax a clearance request. He states that he is not sure but will speak to them.   Patient also asking about scheduling a follow-up appointment with Dr. Martinique since he has not seen him in a while. Patient scheduled for overdue follow-up on 9/14.

## 2020-06-20 NOTE — Telephone Encounter (Signed)
New message:     Patient calling to ask about a few apt's and would like to know if he needs a test. Please call patient.

## 2020-06-22 NOTE — Telephone Encounter (Signed)
Eugenia from [facility] called checking on the status of the clearance. Please call her at [phone]

## 2020-06-22 NOTE — Telephone Encounter (Signed)
The dentist office called, they never received the clearance from 8/23.  Please print and hand fax my clearance note from 06/20/2020 to [phone].  Kerin Ransom PA-C 06/22/2020 5:01 PM

## 2020-06-23 NOTE — Telephone Encounter (Signed)
Clearance faxed via fax machine

## 2020-06-26 DIAGNOSIS — G4733 Obstructive sleep apnea (adult) (pediatric): Secondary | ICD-10-CM | POA: Diagnosis not present

## 2020-06-28 DIAGNOSIS — I10 Essential (primary) hypertension: Secondary | ICD-10-CM | POA: Diagnosis not present

## 2020-07-01 DIAGNOSIS — E1142 Type 2 diabetes mellitus with diabetic polyneuropathy: Secondary | ICD-10-CM | POA: Diagnosis not present

## 2020-07-01 DIAGNOSIS — E1165 Type 2 diabetes mellitus with hyperglycemia: Secondary | ICD-10-CM | POA: Diagnosis not present

## 2020-07-01 DIAGNOSIS — I2510 Atherosclerotic heart disease of native coronary artery without angina pectoris: Secondary | ICD-10-CM | POA: Diagnosis not present

## 2020-07-01 DIAGNOSIS — N183 Chronic kidney disease, stage 3 unspecified: Secondary | ICD-10-CM | POA: Diagnosis not present

## 2020-07-01 DIAGNOSIS — I252 Old myocardial infarction: Secondary | ICD-10-CM | POA: Diagnosis not present

## 2020-07-01 DIAGNOSIS — I10 Essential (primary) hypertension: Secondary | ICD-10-CM | POA: Diagnosis not present

## 2020-07-01 DIAGNOSIS — F324 Major depressive disorder, single episode, in partial remission: Secondary | ICD-10-CM | POA: Diagnosis not present

## 2020-07-01 DIAGNOSIS — E039 Hypothyroidism, unspecified: Secondary | ICD-10-CM | POA: Diagnosis not present

## 2020-07-01 DIAGNOSIS — E785 Hyperlipidemia, unspecified: Secondary | ICD-10-CM | POA: Diagnosis not present

## 2020-07-01 DIAGNOSIS — E1122 Type 2 diabetes mellitus with diabetic chronic kidney disease: Secondary | ICD-10-CM | POA: Diagnosis not present

## 2020-07-01 DIAGNOSIS — F325 Major depressive disorder, single episode, in full remission: Secondary | ICD-10-CM | POA: Diagnosis not present

## 2020-07-08 DIAGNOSIS — Z8546 Personal history of malignant neoplasm of prostate: Secondary | ICD-10-CM | POA: Diagnosis not present

## 2020-07-08 DIAGNOSIS — U071 COVID-19: Secondary | ICD-10-CM | POA: Diagnosis not present

## 2020-07-08 DIAGNOSIS — R809 Proteinuria, unspecified: Secondary | ICD-10-CM | POA: Diagnosis not present

## 2020-07-08 DIAGNOSIS — E1122 Type 2 diabetes mellitus with diabetic chronic kidney disease: Secondary | ICD-10-CM | POA: Diagnosis not present

## 2020-07-08 DIAGNOSIS — F324 Major depressive disorder, single episode, in partial remission: Secondary | ICD-10-CM | POA: Diagnosis not present

## 2020-07-08 DIAGNOSIS — I10 Essential (primary) hypertension: Secondary | ICD-10-CM | POA: Diagnosis not present

## 2020-07-08 DIAGNOSIS — Z91030 Bee allergy status: Secondary | ICD-10-CM | POA: Diagnosis not present

## 2020-07-08 DIAGNOSIS — I2510 Atherosclerotic heart disease of native coronary artery without angina pectoris: Secondary | ICD-10-CM | POA: Diagnosis not present

## 2020-07-08 DIAGNOSIS — K219 Gastro-esophageal reflux disease without esophagitis: Secondary | ICD-10-CM | POA: Diagnosis not present

## 2020-07-08 DIAGNOSIS — E039 Hypothyroidism, unspecified: Secondary | ICD-10-CM | POA: Diagnosis not present

## 2020-07-08 DIAGNOSIS — E785 Hyperlipidemia, unspecified: Secondary | ICD-10-CM | POA: Diagnosis not present

## 2020-07-10 NOTE — Progress Notes (Signed)
Cardiology Office Note   Date:  07/12/2020   ID:  Paul HELLENBRAND, DOB [birth year], MRN [id number]  PCP:  [name], MD  Cardiologist:   Knut Rondinelli Martinique, MD   Chief Complaint  Patient presents with  . Coronary Artery Disease      History of Present Illness: Paul [name] is a 74 y.o. male who is seen for follow up.CAD [name] lightheadedness.  He has a history including CAD status post coronary artery bypass grafting in 2005, hypertension, hyperlipidemia, diabetes, hypothyroidism, [name] obstructive sleep apnea. In November 2016, he also reported chest tightness [name] underwent stress testing which showed inferolateral infarct with peri-infarct ischemia. This resulted in diagnostic catheterization being performed [name] revealing severe disease in the vein graft to the OM 2. The vein graft to the PDA was occluded [name] the distal right coronary artery filled via left-to-right collaterals. The vein graft to the OM 2 was felt to be the culprit [name] was successfully stented. Following the procedure, patient initially had some shortness of breath which ultimately was secondary to Brilinta [name] once he was switched to Plavix, he felt significantly better.   Last yearBP was persistently high [name] Metoprolol [name] losartan doses were increased. He continued to complain of exertional fatigue. He underwent repeat cardiac cath in August 2018 that showed patent grafts except known occlusion of SVG to RCA. No new disease to explain his symptoms. He was later switched from metoprolol to Coreg for better BP control. BP improved [name] Coreg dose was reduced due to persistent symptoms of dizziness [name] fatigue.We tried to taper off Coreg but this did not help his symptoms [name] was resumed.   On follow up today he is doing well. BP recordings have generally been good with occasional high readings in the 150s. Has to be careful in the heat. No chest pain or dyspnea. No dizziness.    Past Medical History:  Diagnosis  Date  . Allergy   . Coronary artery disease    a. s/p CABG;  b. 08/2015 MV: inf/inflat infarct w/ new peri-infarct isch; c. LHC 11/16: pLAD 70, dLAD 50, D1 100, oRI 100, mLCx 60, OM2 99, pRCA 99 (L>R collats), S-RPDA 100, S-D1 ok, S-RI/OM2 mid 85 (PCI: Synergy DES), L-LAD ok, EF 55-65%.  . Depression   . GERD (gastroesophageal reflux disease)   . Hyperlipidemia   . Hypertensive heart disease   . Hypothyroidism   . Insomnia   . OSA (obstructive sleep apnea)    CPAP  . Prostate cancer ([place])   . Type II diabetes mellitus ([place])     Past Surgical History:  Procedure Laterality Date  . CARDIAC CATHETERIZATION N/A 09/19/2015   Procedure: Coronary Stent Intervention;  Surgeon: Alexandria Current M Martinique, MD;  Location: [facility];  Service: Cardiovascular;  Laterality: N/A;  . CARDIAC CATHETERIZATION  09/19/2015   Procedure: Left Heart Cath [name] Cors/Grafts Angiography;  Surgeon: Elfreda Blanchet M Martinique, MD;  Location: [facility];  Service: Cardiovascular;;  . CHOLECYSTECTOMY    . CORONARY ARTERY BYPASS GRAFT    . JOINT REPLACEMENT     right knee  . LEFT HEART CATH [name] CORS/GRAFTS ANGIOGRAPHY N/A 06/26/2017   Procedure: LEFT HEART CATH [name] CORS/GRAFTS ANGIOGRAPHY;  Surgeon: Martinique, Ginni Eichler M, MD;  Location: [facility];  Service: Cardiovascular;  Laterality: N/A;  . PROSTATE SURGERY       Current Outpatient Medications  Medication Sig Dispense Refill  . acetaminophen (TYLENOL 8 HOUR ARTHRITIS PAIN) 650 MG  CR tablet Take 1,300 mg by mouth every 8 (eight) hours as needed for pain.    Marland Kitchen aspirin EC 81 MG tablet Take 81 mg by mouth daily.    . carvedilol (COREG) 12.5 MG tablet Take 1 tablet by mouth twice daily 180 tablet 2  . Cholecalciferol (VITAMIN D) 2000 units tablet Take 2,000 Units by mouth daily.    . clindamycin (CLEOCIN) 150 MG capsule Take 300 mg by mouth See admin instructions. Take 2 capsules (300 mg) 1 hour before dental procedure.    . colestipol (COLESTID) 1 G tablet Take 1 g  by mouth at bedtime.     Marland Kitchen doxycycline (VIBRAMYCIN) 100 MG capsule Take 100 mg by mouth 2 (two) times daily.    . DULoxetine (CYMBALTA) 30 MG capsule Take by mouth in the morning [name] at bedtime.    . empagliflozin (JARDIANCE) 10 MG TABS tablet Take 10 mg by mouth daily. (Patient taking differently: Take 100 mg by mouth daily. ) 30 tablet   . EPINEPHrine (EPIPEN 2-PAK) 0.3 mg/0.3 mL IJ SOAJ injection Inject 0.3 mg into the muscle daily as needed (for anaphylatic allergic reaction.).    Marland Kitchen fluticasone (FLONASE) 50 MCG/ACT nasal spray Place 2 sprays into both nostrils daily as needed for allergies or rhinitis.    Marland Kitchen glipiZIDE (GLUCOTROL) 5 MG tablet Take 5 mg by mouth 2 (two) times daily.    Marland Kitchen guaiFENesin (MUCINEX) 600 MG 12 hr tablet Take 600-1,200 mg by mouth 2 (two) times daily as needed (for sinus drainage/cough/congestion).    Marland Kitchen levothyroxine (SYNTHROID, LEVOTHROID) 75 MCG tablet Take 75 mcg by mouth daily before breakfast.     . liothyronine (CYTOMEL) 5 MCG tablet Take 5 mcg by mouth daily with breakfast.     . loperamide (IMODIUM A-D) 2 MG tablet Take 2-4 mg by mouth 4 (four) times daily as needed for diarrhea or loose stools.    Marland Kitchen losartan (COZAAR) 100 MG tablet TAKE 1 TABLET BY MOUTH ONCE DAILY. 90 tablet 3  . metFORMIN (GLUCOPHAGE) 1000 MG tablet Take 1 tablet (1,000 mg total) by mouth 2 (two) times daily.    . Multiple Vitamin (MULTIVITAMIN) tablet Take 1 tablet by mouth daily. Reported on 11/10/2015    . nitroGLYCERIN (NITROSTAT) 0.4 MG SL tablet Place 1 tablet (0.4 mg total) under the tongue every 5 (five) minutes as needed for chest pain. 25 tablet 6  . pantoprazole (PROTONIX) 20 MG tablet Take 1 tablet (20 mg total) by mouth daily. 90 tablet 0  . potassium citrate (UROCIT-K) 10 MEQ (1080 MG) SR tablet Take 10 mEq by mouth 2 (two) times daily.     . rosuvastatin (CRESTOR) 10 MG tablet Take 1 tablet by mouth once daily 90 tablet 1  . triamcinolone cream (KENALOG) 0.1 % Apply 1 application  topically as directed.    . vitamin B-12 (CYANOCOBALAMIN) 250 MCG tablet Take 250 mcg by mouth daily.    Marland Kitchen amLODipine (NORVASC) 5 MG tablet Take 1 tablet (5 mg total) by mouth 2 (two) times daily. 180 tablet 3   No current facility-administered medications for this visit.    Allergies:   Brilinta [ticagrelor], Yellow jacket venom, Morphine, Penicillins, Percocet [oxycodone-acetaminophen], Septra ds [sulfamethoxazole-trimethoprim], Vicodin [hydrocodone-acetaminophen], [name] Alprazolam    Social History:  The patient  reports that he has never smoked. He has never used smokeless tobacco. He reports that he does not drink alcohol [name] does not use drugs.   Family History:  The patient's family history includes  Heart attack in his father; Heart disease in his mother; Vascular Disease in his sister.    ROS:  Please see the history of present illness.   Otherwise, review of systems are positive for none.   All other systems are reviewed [name] negative.    PHYSICAL EXAM: VS:  BP 112/64   Pulse 83   Ht 5\' 9"  (1.753 m)   Wt 205 lb (93 kg)   SpO2 97%   BMI 30.27 kg/m  , BMI Body mass index is 30.27 kg/m. GEN: Well nourished, well developed, in no acute distress  HEENT: normal  Neck: no JVD, carotid bruits, or masses Cardiac: RRR; no murmurs, rubs, or gallops,no edema  Respiratory:  clear to auscultation bilaterally, normal work of breathing GI: soft, nontender, nondistended, + BS MS: no deformity or atrophy  Skin: warm [name] dry, no rash Neuro:  Strength [name] sensation are intact Psych: euthymic mood, full affect   EKG:  EKG is ordered today. The ekg ordered today demonstrates NSR rate 83. Normal. I have personally reviewed [name] interpreted this study.    Recent Labs: No results found for requested labs within last 8760 hours.    Lipid Panel    Component Value Date/Time   CHOL 162 06/24/2018 0848   TRIG 211 (H) 06/24/2018 0848   HDL 32 (L) 06/24/2018 0848   CHOLHDL 4.5 08/05/2017  0835   CHOLHDL 4.5 07/20/2016 0950   VLDL 47 (H) 07/20/2016 0950   LDLCALC 88 06/24/2018 0848   LDLDIRECT 76.0 02/28/2015 0939    Labs dated 07/08/20: A1c 8.1%. cholesterol 120, triglycerides 226, HDL 42, LDL 43. Creatinine 1.15. glucose 186. Otherwise CMET [name] TSH normal.   Wt Readings from Last 3 Encounters:  07/12/20 205 lb (93 kg)  06/27/18 216 lb 3.2 oz (98.1 kg)  03/06/18 221 lb 3.2 oz (100.3 kg)      Other studies Reviewed: Additional studies/ records that were reviewed today include:   Echo 07/04/18: Study Conclusions   - Procedure narrative: Transthoracic echocardiography. Image  quality was adequate. Intravenous contrast (Definity) was  administered.  - Left ventricle: The cavity size was normal. Wall thickness was  increased in a pattern of mild LVH. Systolic function was normal.  The estimated ejection fraction was in the range of 55% to 60%.  Hypokinesis of the anteroseptal [name] inferoseptal myocardium  consistent with prior CABG. Doppler parameters are consistent  with abnormal left ventricular relaxation (grade 1 diastolic  dysfunction). Doppler parameters are consistent with  indeterminate ventricular filling pressure.  - Aortic valve: Transvalvular velocity was within the normal range.  There was no stenosis.  - Mitral valve: Transvalvular velocity was within the normal range.  There was no evidence for stenosis. There was no regurgitation.  - Right ventricle: The cavity size was normal. Wall thickness was  normal. Systolic function was normal.  - Tricuspid valve: There was trivial regurgitation.  - Pulmonary arteries: Systolic pressure was within the normal  range. PA peak pressure: 31 mm Hg (S).   Cardiopulmonary stress test: 07/10/18: Procedure: This patient underwent staged symptom-limited exercise testing using a individualized bike protocol with expired gas analysis metabolic evaluation during exercise.   Demographics   Age: 74  Ht. (in.) 64 Wt. (lb) 214 BMI: 31.6   Predicted Peak VO2: 20.4   Gender: Male Ht (cm) 175.3 Wt. (kg) 97.1    Results   Pre-Exercise PFTs   FVC 4.30 (106%)     FEV1 3.32 (108%)  FEV1/FVC 77 (101%)      MVV 141 (115%)      Exercise Time:  10:15  Watts: 120   RPE: 16   Reason stopped: patient ended test due to dyspnea (7/10)   Additional symptoms: leg fatigue [name] dizziness (3/10)   Resting HR: 91 Peak HR: 132  (89% age predicted max HR)   BP rest: 116/60 BP peak: 150/68   Peak VO2: 18.8 (92% predicted peak VO2)   VE/VCO2 slope: 44   OUES: 1.71   Peak RER: 1.15   Ventilatory Threshold: 14.7 (72% predicted [name] 78% measured peak VO2)   Peak RR 51   Peak Ventilation: 103.6   VE/MVV: 74%   PETCO2 at peak: 23   O2pulse: 14  (108% predicted O2pulse)    Interpretation   Notes: Patient gave a good effort. Pulse-oximetry remained 99-100% for the duration of exercise. Exercise was performed on a cycle ergometer starting at [facility] [name] increasing by 12W/min.   ECG: Resting ECG in sinus rhyhtm. HR response appropriate. There were occasional PVCs at rest, into exercise [name] recovery. There were no sustained arrhythmias [name] no ST-T changes. BP response appropriate for the work performed.   PFT: Pre-exercise spirometry was within normal limits. The MVV was normal.   CPX: Exercise testing with gas exchange demonstrates a normal peak VO2 of 18.8 ml/kg/min (92% of the age/gender/weight matched sedentary norms). The RER of 1.15 indicates a maximal effort. When adjusted to the patient's ideal body weight of 171.4 lb (77.8 kg) the peak VO2 is 23.5 ml/kg (ibw)/min (98% of the ibw-adjusted predicted). The VE/VCO2 slope is significantly elevated [name] indicates excessive dead space ventilation. The oxygen uptake efficiency slope (OUES) is normal. The VO2 at the ventilatory threshold was normal at 72% of the predicted peak VO2 [name] 78% measured PVO2. At peak  exercise, the ventilation reached 74% of the measured MVV indicating ventilatory reserve was nearly depletion. The O2pulse (a surrogate for stroke volume) increased with incremental exercise, reaching peak of 14 ml/beat (108% predicted).    Conclusion: Exercise testing with gas exchange demonstrates normal functional capacity when compared to matched sedentary norms. There is no clear indication for cardiopulmonary limitation, however circulatory limitation cannot be completely excluded with a VE/VCO2 slope of 44. This could potentially be due to hyperventilation at peak exercise.    Test, report [name] preliminary impression by:  Landis Martins, MS, ACSM-RCEP  07/10/2018 3:20 PM   Overall functional capacity is well preserved. At peak exercise the patient is limited by approaching his ventilatory limits. The VE/VCO2 is markedly elevated [name] PETCO2 is low suggestive of end-exercise hyperventilation. However with the high Ve/VCO2 cannot exclude exercise-induced PAH however the lack of desaturations makes this unlikely. If symptoms persist would consider repeat testing with invasive monitoring.   Glori Bickers, MD  5:31 PM   Carotid dopplers Oct 2020: 40-59% bilateral ICA stenosis.    ASSESSMENT [name] PLAN:  1.  Coronary artery disease: Status post prior CABG in 2005 with PCI [name] DES placement to the vein graft to the second obtuse marginal in August 2016. Catheterization at that time also revealed a patent LIMA to the LAD [name] vein graft to the diagonal. The vein graft to the PDA was occluded as was the native right coronary artery, though the distal right coronary was fed via left-to-right collaterals.Not a candidate for CTO PCI.Last cardiac cath in August 2018 showed no change in disease.   3. Hypertensive heart disease- see above. BP is well controlled now.  4. Hyperlipidemia: Excellent LDL on Crestor. Mildly elevated triglycerides. Continue dietary modification.   5. Carotid arterial  disease. 40-59% bilateral. Will update dopplers in October.    Current medicines are reviewed at length with the patient today.  The patient does not have concerns regarding medicines.  The following changes have been made:  no change  Labs/ tests ordered today include:   Orders Placed This Encounter  Procedures  . EKG 12-Lead  . VAS US CAROTID     Disposition:   FU with me in 1 year  Signed, Anuja Manka Martinique, MD  07/12/2020 10:03 AM    [facility] [address], [place], [place], [address] Phone [phone], Fax [phone]

## 2020-07-12 ENCOUNTER — Other Ambulatory Visit: Payer: Self-pay

## 2020-07-12 ENCOUNTER — Encounter: Payer: Self-pay | Admitting: Cardiology

## 2020-07-12 ENCOUNTER — Ambulatory Visit: Payer: PPO | Admitting: Cardiology

## 2020-07-12 VITALS — BP 112/64 | HR 83 | Ht 69.0 in | Wt 205.0 lb

## 2020-07-12 DIAGNOSIS — I25708 Atherosclerosis of coronary artery bypass graft(s), unspecified, with other forms of angina pectoris: Secondary | ICD-10-CM | POA: Diagnosis not present

## 2020-07-12 DIAGNOSIS — I779 Disorder of arteries and arterioles, unspecified: Secondary | ICD-10-CM | POA: Diagnosis not present

## 2020-07-12 DIAGNOSIS — I6523 Occlusion and stenosis of bilateral carotid arteries: Secondary | ICD-10-CM

## 2020-07-12 DIAGNOSIS — Z951 Presence of aortocoronary bypass graft: Secondary | ICD-10-CM

## 2020-07-12 DIAGNOSIS — I10 Essential (primary) hypertension: Secondary | ICD-10-CM | POA: Diagnosis not present

## 2020-07-12 DIAGNOSIS — E7800 Pure hypercholesterolemia, unspecified: Secondary | ICD-10-CM

## 2020-07-12 MED ORDER — AMLODIPINE BESYLATE 5 MG PO TABS: 5.0000 mg | ORAL_TABLET | Freq: Two times a day (BID) | ORAL | 3 refills | Status: DC

## 2020-07-12 MED ORDER — LOSARTAN POTASSIUM 100 MG PO TABS: ORAL_TABLET | ORAL | 3 refills | Status: AC

## 2020-07-12 MED ORDER — NITROGLYCERIN 0.4 MG SL SUBL: 0.4000 mg | SUBLINGUAL_TABLET | SUBLINGUAL | 6 refills | Status: DC | PRN

## 2020-07-12 NOTE — Patient Instructions (Signed)
Medication Instructions:  Continue same medications *If you need a refill on your cardiac medications before your next appointment, please call your pharmacy*   Lab Work: None ordered   Testing/Procedures: Schedule Carotid dopplers in 10/21   Follow-Up: At [facility], you and your health needs are our priority.  As part of our continuing mission to provide you with exceptional heart care, we have created designated Provider Care Teams.  These Care Teams include your primary Cardiologist (physician) and Advanced Practice Providers (APPs -  Physician Assistants and Nurse Practitioners) who all work together to provide you with the care you need, when you need it.  We recommend signing up for the patient portal called "MyChart".  Sign up information is provided on this After Visit Summary.  MyChart is used to connect with patients for Virtual Visits (Telemedicine).  Patients are able to view lab/test results, encounter notes, upcoming appointments, etc.  Non-urgent messages can be sent to your provider as well.   To learn more about what you can do with MyChart, go to [email or web address].    Your next appointment:  1 year    Call in July to schedule Sept appointment    The format for your next appointment: Office     Provider: Dr.Jordan

## 2020-07-15 DIAGNOSIS — U071 COVID-19: Secondary | ICD-10-CM | POA: Diagnosis not present

## 2020-07-15 DIAGNOSIS — R809 Proteinuria, unspecified: Secondary | ICD-10-CM | POA: Diagnosis not present

## 2020-07-15 DIAGNOSIS — E039 Hypothyroidism, unspecified: Secondary | ICD-10-CM | POA: Diagnosis not present

## 2020-07-15 DIAGNOSIS — F324 Major depressive disorder, single episode, in partial remission: Secondary | ICD-10-CM | POA: Diagnosis not present

## 2020-07-15 DIAGNOSIS — Z91030 Bee allergy status: Secondary | ICD-10-CM | POA: Diagnosis not present

## 2020-07-15 DIAGNOSIS — I2510 Atherosclerotic heart disease of native coronary artery without angina pectoris: Secondary | ICD-10-CM | POA: Diagnosis not present

## 2020-07-15 DIAGNOSIS — Z8546 Personal history of malignant neoplasm of prostate: Secondary | ICD-10-CM | POA: Diagnosis not present

## 2020-07-15 DIAGNOSIS — K219 Gastro-esophageal reflux disease without esophagitis: Secondary | ICD-10-CM | POA: Diagnosis not present

## 2020-07-15 DIAGNOSIS — E785 Hyperlipidemia, unspecified: Secondary | ICD-10-CM | POA: Diagnosis not present

## 2020-07-15 DIAGNOSIS — I10 Essential (primary) hypertension: Secondary | ICD-10-CM | POA: Diagnosis not present

## 2020-07-15 DIAGNOSIS — E1122 Type 2 diabetes mellitus with diabetic chronic kidney disease: Secondary | ICD-10-CM | POA: Diagnosis not present

## 2020-07-25 DIAGNOSIS — E1122 Type 2 diabetes mellitus with diabetic chronic kidney disease: Secondary | ICD-10-CM | POA: Diagnosis not present

## 2020-07-25 DIAGNOSIS — I10 Essential (primary) hypertension: Secondary | ICD-10-CM | POA: Diagnosis not present

## 2020-07-25 DIAGNOSIS — I2510 Atherosclerotic heart disease of native coronary artery without angina pectoris: Secondary | ICD-10-CM | POA: Diagnosis not present

## 2020-07-25 DIAGNOSIS — J309 Allergic rhinitis, unspecified: Secondary | ICD-10-CM | POA: Diagnosis not present

## 2020-07-25 DIAGNOSIS — K219 Gastro-esophageal reflux disease without esophagitis: Secondary | ICD-10-CM | POA: Diagnosis not present

## 2020-07-25 DIAGNOSIS — F324 Major depressive disorder, single episode, in partial remission: Secondary | ICD-10-CM | POA: Diagnosis not present

## 2020-07-25 DIAGNOSIS — K529 Noninfective gastroenteritis and colitis, unspecified: Secondary | ICD-10-CM | POA: Diagnosis not present

## 2020-07-25 DIAGNOSIS — Z91030 Bee allergy status: Secondary | ICD-10-CM | POA: Diagnosis not present

## 2020-07-25 DIAGNOSIS — E039 Hypothyroidism, unspecified: Secondary | ICD-10-CM | POA: Diagnosis not present

## 2020-07-25 DIAGNOSIS — R809 Proteinuria, unspecified: Secondary | ICD-10-CM | POA: Diagnosis not present

## 2020-07-25 DIAGNOSIS — E785 Hyperlipidemia, unspecified: Secondary | ICD-10-CM | POA: Diagnosis not present

## 2020-07-25 DIAGNOSIS — U071 COVID-19: Secondary | ICD-10-CM | POA: Diagnosis not present

## 2020-07-27 DIAGNOSIS — G4733 Obstructive sleep apnea (adult) (pediatric): Secondary | ICD-10-CM | POA: Diagnosis not present

## 2020-07-28 DIAGNOSIS — Z1211 Encounter for screening for malignant neoplasm of colon: Secondary | ICD-10-CM | POA: Diagnosis not present

## 2020-08-03 ENCOUNTER — Other Ambulatory Visit: Payer: Self-pay

## 2020-08-03 ENCOUNTER — Other Ambulatory Visit (HOSPITAL_COMMUNITY): Payer: Self-pay | Admitting: Cardiology

## 2020-08-03 ENCOUNTER — Ambulatory Visit (HOSPITAL_COMMUNITY)
Admission: RE | Admit: 2020-08-03 | Discharge: 2020-08-03 | Disposition: A | Discharge status: Home / Self Care | Payer: PPO | Source: Ambulatory Visit | Attending: Cardiology | Admitting: Cardiology

## 2020-08-03 DIAGNOSIS — I25708 Atherosclerosis of coronary artery bypass graft(s), unspecified, with other forms of angina pectoris: Secondary | ICD-10-CM | POA: Insufficient documentation

## 2020-08-03 DIAGNOSIS — I771 Stricture of artery: Secondary | ICD-10-CM

## 2020-08-03 DIAGNOSIS — I6523 Occlusion and stenosis of bilateral carotid arteries: Secondary | ICD-10-CM | POA: Diagnosis not present

## 2020-08-03 DIAGNOSIS — I10 Essential (primary) hypertension: Secondary | ICD-10-CM | POA: Diagnosis not present

## 2020-08-03 DIAGNOSIS — Z951 Presence of aortocoronary bypass graft: Secondary | ICD-10-CM | POA: Insufficient documentation

## 2020-08-03 DIAGNOSIS — E7800 Pure hypercholesterolemia, unspecified: Secondary | ICD-10-CM | POA: Insufficient documentation

## 2020-08-17 DIAGNOSIS — C61 Malignant neoplasm of prostate: Secondary | ICD-10-CM | POA: Diagnosis not present

## 2020-08-24 DIAGNOSIS — N393 Stress incontinence (female) (male): Secondary | ICD-10-CM | POA: Diagnosis not present

## 2020-08-24 DIAGNOSIS — C61 Malignant neoplasm of prostate: Secondary | ICD-10-CM | POA: Diagnosis not present

## 2020-08-26 DIAGNOSIS — G4733 Obstructive sleep apnea (adult) (pediatric): Secondary | ICD-10-CM | POA: Diagnosis not present

## 2020-08-26 DIAGNOSIS — I10 Essential (primary) hypertension: Secondary | ICD-10-CM | POA: Diagnosis not present

## 2020-09-12 DIAGNOSIS — G4733 Obstructive sleep apnea (adult) (pediatric): Secondary | ICD-10-CM | POA: Diagnosis not present

## 2020-09-26 DIAGNOSIS — G4733 Obstructive sleep apnea (adult) (pediatric): Secondary | ICD-10-CM | POA: Diagnosis not present

## 2020-09-27 DIAGNOSIS — I2510 Atherosclerotic heart disease of native coronary artery without angina pectoris: Secondary | ICD-10-CM | POA: Diagnosis not present

## 2020-09-27 DIAGNOSIS — F3342 Major depressive disorder, recurrent, in full remission: Secondary | ICD-10-CM | POA: Diagnosis not present

## 2020-09-27 DIAGNOSIS — R195 Other fecal abnormalities: Secondary | ICD-10-CM | POA: Diagnosis not present

## 2020-09-27 DIAGNOSIS — N183 Chronic kidney disease, stage 3 unspecified: Secondary | ICD-10-CM | POA: Diagnosis not present

## 2020-09-27 DIAGNOSIS — I252 Old myocardial infarction: Secondary | ICD-10-CM | POA: Diagnosis not present

## 2020-09-27 DIAGNOSIS — E1165 Type 2 diabetes mellitus with hyperglycemia: Secondary | ICD-10-CM | POA: Diagnosis not present

## 2020-09-27 DIAGNOSIS — E1122 Type 2 diabetes mellitus with diabetic chronic kidney disease: Secondary | ICD-10-CM | POA: Diagnosis not present

## 2020-09-27 DIAGNOSIS — E785 Hyperlipidemia, unspecified: Secondary | ICD-10-CM | POA: Diagnosis not present

## 2020-09-27 DIAGNOSIS — K219 Gastro-esophageal reflux disease without esophagitis: Secondary | ICD-10-CM | POA: Diagnosis not present

## 2020-09-27 DIAGNOSIS — I10 Essential (primary) hypertension: Secondary | ICD-10-CM | POA: Diagnosis not present

## 2020-09-27 DIAGNOSIS — E1142 Type 2 diabetes mellitus with diabetic polyneuropathy: Secondary | ICD-10-CM | POA: Diagnosis not present

## 2020-09-27 DIAGNOSIS — F324 Major depressive disorder, single episode, in partial remission: Secondary | ICD-10-CM | POA: Diagnosis not present

## 2020-09-27 DIAGNOSIS — K9089 Other intestinal malabsorption: Secondary | ICD-10-CM | POA: Diagnosis not present

## 2020-09-27 DIAGNOSIS — Z8679 Personal history of other diseases of the circulatory system: Secondary | ICD-10-CM | POA: Diagnosis not present

## 2020-09-27 DIAGNOSIS — E039 Hypothyroidism, unspecified: Secondary | ICD-10-CM | POA: Diagnosis not present

## 2020-09-27 DIAGNOSIS — Z87898 Personal history of other specified conditions: Secondary | ICD-10-CM | POA: Diagnosis not present

## 2020-10-03 DIAGNOSIS — E1122 Type 2 diabetes mellitus with diabetic chronic kidney disease: Secondary | ICD-10-CM | POA: Diagnosis not present

## 2020-10-03 DIAGNOSIS — F3342 Major depressive disorder, recurrent, in full remission: Secondary | ICD-10-CM | POA: Diagnosis not present

## 2020-10-03 DIAGNOSIS — F411 Generalized anxiety disorder: Secondary | ICD-10-CM | POA: Diagnosis not present

## 2020-10-07 DIAGNOSIS — E039 Hypothyroidism, unspecified: Secondary | ICD-10-CM | POA: Diagnosis not present

## 2020-10-07 DIAGNOSIS — E785 Hyperlipidemia, unspecified: Secondary | ICD-10-CM | POA: Diagnosis not present

## 2020-10-07 DIAGNOSIS — E1122 Type 2 diabetes mellitus with diabetic chronic kidney disease: Secondary | ICD-10-CM | POA: Diagnosis not present

## 2020-10-07 DIAGNOSIS — E1165 Type 2 diabetes mellitus with hyperglycemia: Secondary | ICD-10-CM | POA: Diagnosis not present

## 2020-10-07 DIAGNOSIS — I252 Old myocardial infarction: Secondary | ICD-10-CM | POA: Diagnosis not present

## 2020-10-07 DIAGNOSIS — F324 Major depressive disorder, single episode, in partial remission: Secondary | ICD-10-CM | POA: Diagnosis not present

## 2020-10-07 DIAGNOSIS — K219 Gastro-esophageal reflux disease without esophagitis: Secondary | ICD-10-CM | POA: Diagnosis not present

## 2020-10-07 DIAGNOSIS — I2510 Atherosclerotic heart disease of native coronary artery without angina pectoris: Secondary | ICD-10-CM | POA: Diagnosis not present

## 2020-10-07 DIAGNOSIS — N183 Chronic kidney disease, stage 3 unspecified: Secondary | ICD-10-CM | POA: Diagnosis not present

## 2020-10-07 DIAGNOSIS — F325 Major depressive disorder, single episode, in full remission: Secondary | ICD-10-CM | POA: Diagnosis not present

## 2020-10-07 DIAGNOSIS — I10 Essential (primary) hypertension: Secondary | ICD-10-CM | POA: Diagnosis not present

## 2020-10-26 DIAGNOSIS — G4733 Obstructive sleep apnea (adult) (pediatric): Secondary | ICD-10-CM | POA: Diagnosis not present

## 2020-11-03 DIAGNOSIS — F411 Generalized anxiety disorder: Secondary | ICD-10-CM | POA: Diagnosis not present

## 2020-11-03 DIAGNOSIS — I10 Essential (primary) hypertension: Secondary | ICD-10-CM | POA: Diagnosis not present

## 2020-11-09 DIAGNOSIS — Z1159 Encounter for screening for other viral diseases: Secondary | ICD-10-CM | POA: Diagnosis not present

## 2020-11-11 DIAGNOSIS — R195 Other fecal abnormalities: Secondary | ICD-10-CM | POA: Diagnosis not present

## 2020-11-11 DIAGNOSIS — K5730 Diverticulosis of large intestine without perforation or abscess without bleeding: Secondary | ICD-10-CM | POA: Diagnosis not present

## 2020-11-11 DIAGNOSIS — K648 Other hemorrhoids: Secondary | ICD-10-CM | POA: Diagnosis not present

## 2020-11-11 DIAGNOSIS — K621 Rectal polyp: Secondary | ICD-10-CM | POA: Diagnosis not present

## 2020-11-11 DIAGNOSIS — D125 Benign neoplasm of sigmoid colon: Secondary | ICD-10-CM | POA: Diagnosis not present

## 2020-11-11 DIAGNOSIS — D123 Benign neoplasm of transverse colon: Secondary | ICD-10-CM | POA: Diagnosis not present

## 2020-11-16 DIAGNOSIS — K621 Rectal polyp: Secondary | ICD-10-CM | POA: Diagnosis not present

## 2020-11-16 DIAGNOSIS — D123 Benign neoplasm of transverse colon: Secondary | ICD-10-CM | POA: Diagnosis not present

## 2020-11-16 DIAGNOSIS — D125 Benign neoplasm of sigmoid colon: Secondary | ICD-10-CM | POA: Diagnosis not present

## 2020-11-17 DIAGNOSIS — E1165 Type 2 diabetes mellitus with hyperglycemia: Secondary | ICD-10-CM | POA: Diagnosis not present

## 2020-11-17 DIAGNOSIS — E1122 Type 2 diabetes mellitus with diabetic chronic kidney disease: Secondary | ICD-10-CM | POA: Diagnosis not present

## 2020-11-17 DIAGNOSIS — N183 Chronic kidney disease, stage 3 unspecified: Secondary | ICD-10-CM | POA: Diagnosis not present

## 2020-11-17 DIAGNOSIS — E039 Hypothyroidism, unspecified: Secondary | ICD-10-CM | POA: Diagnosis not present

## 2020-11-17 DIAGNOSIS — F325 Major depressive disorder, single episode, in full remission: Secondary | ICD-10-CM | POA: Diagnosis not present

## 2020-11-17 DIAGNOSIS — I2510 Atherosclerotic heart disease of native coronary artery without angina pectoris: Secondary | ICD-10-CM | POA: Diagnosis not present

## 2020-11-17 DIAGNOSIS — F324 Major depressive disorder, single episode, in partial remission: Secondary | ICD-10-CM | POA: Diagnosis not present

## 2020-11-17 DIAGNOSIS — Z8546 Personal history of malignant neoplasm of prostate: Secondary | ICD-10-CM | POA: Diagnosis not present

## 2020-11-17 DIAGNOSIS — E785 Hyperlipidemia, unspecified: Secondary | ICD-10-CM | POA: Diagnosis not present

## 2020-11-17 DIAGNOSIS — F3342 Major depressive disorder, recurrent, in full remission: Secondary | ICD-10-CM | POA: Diagnosis not present

## 2020-11-17 DIAGNOSIS — I10 Essential (primary) hypertension: Secondary | ICD-10-CM | POA: Diagnosis not present

## 2020-11-24 DIAGNOSIS — C61 Malignant neoplasm of prostate: Secondary | ICD-10-CM | POA: Diagnosis not present

## 2020-11-27 DIAGNOSIS — I10 Essential (primary) hypertension: Secondary | ICD-10-CM | POA: Diagnosis not present

## 2020-11-28 DIAGNOSIS — I10 Essential (primary) hypertension: Secondary | ICD-10-CM | POA: Diagnosis not present

## 2020-12-13 DIAGNOSIS — M79601 Pain in right arm: Secondary | ICD-10-CM | POA: Diagnosis not present

## 2020-12-22 DIAGNOSIS — G4733 Obstructive sleep apnea (adult) (pediatric): Secondary | ICD-10-CM | POA: Diagnosis not present

## 2021-01-03 DIAGNOSIS — I2510 Atherosclerotic heart disease of native coronary artery without angina pectoris: Secondary | ICD-10-CM | POA: Diagnosis not present

## 2021-01-03 DIAGNOSIS — E785 Hyperlipidemia, unspecified: Secondary | ICD-10-CM | POA: Diagnosis not present

## 2021-01-03 DIAGNOSIS — K219 Gastro-esophageal reflux disease without esophagitis: Secondary | ICD-10-CM | POA: Diagnosis not present

## 2021-01-03 DIAGNOSIS — F3342 Major depressive disorder, recurrent, in full remission: Secondary | ICD-10-CM | POA: Diagnosis not present

## 2021-01-03 DIAGNOSIS — I10 Essential (primary) hypertension: Secondary | ICD-10-CM | POA: Diagnosis not present

## 2021-01-03 DIAGNOSIS — E1142 Type 2 diabetes mellitus with diabetic polyneuropathy: Secondary | ICD-10-CM | POA: Diagnosis not present

## 2021-01-03 DIAGNOSIS — Z8546 Personal history of malignant neoplasm of prostate: Secondary | ICD-10-CM | POA: Diagnosis not present

## 2021-01-03 DIAGNOSIS — M159 Polyosteoarthritis, unspecified: Secondary | ICD-10-CM | POA: Diagnosis not present

## 2021-01-03 DIAGNOSIS — E1165 Type 2 diabetes mellitus with hyperglycemia: Secondary | ICD-10-CM | POA: Diagnosis not present

## 2021-01-03 DIAGNOSIS — E039 Hypothyroidism, unspecified: Secondary | ICD-10-CM | POA: Diagnosis not present

## 2021-01-03 DIAGNOSIS — N183 Chronic kidney disease, stage 3 unspecified: Secondary | ICD-10-CM | POA: Diagnosis not present

## 2021-01-25 DIAGNOSIS — I2510 Atherosclerotic heart disease of native coronary artery without angina pectoris: Secondary | ICD-10-CM | POA: Diagnosis not present

## 2021-01-25 DIAGNOSIS — U071 COVID-19: Secondary | ICD-10-CM | POA: Diagnosis not present

## 2021-01-25 DIAGNOSIS — I10 Essential (primary) hypertension: Secondary | ICD-10-CM | POA: Diagnosis not present

## 2021-01-25 DIAGNOSIS — K219 Gastro-esophageal reflux disease without esophagitis: Secondary | ICD-10-CM | POA: Diagnosis not present

## 2021-01-25 DIAGNOSIS — Z1159 Encounter for screening for other viral diseases: Secondary | ICD-10-CM | POA: Diagnosis not present

## 2021-01-25 DIAGNOSIS — E1122 Type 2 diabetes mellitus with diabetic chronic kidney disease: Secondary | ICD-10-CM | POA: Diagnosis not present

## 2021-01-25 DIAGNOSIS — F324 Major depressive disorder, single episode, in partial remission: Secondary | ICD-10-CM | POA: Diagnosis not present

## 2021-01-25 DIAGNOSIS — Z8546 Personal history of malignant neoplasm of prostate: Secondary | ICD-10-CM | POA: Diagnosis not present

## 2021-01-25 DIAGNOSIS — E785 Hyperlipidemia, unspecified: Secondary | ICD-10-CM | POA: Diagnosis not present

## 2021-01-25 DIAGNOSIS — R809 Proteinuria, unspecified: Secondary | ICD-10-CM | POA: Diagnosis not present

## 2021-01-25 DIAGNOSIS — Z7984 Long term (current) use of oral hypoglycemic drugs: Secondary | ICD-10-CM | POA: Diagnosis not present

## 2021-01-25 DIAGNOSIS — E039 Hypothyroidism, unspecified: Secondary | ICD-10-CM | POA: Diagnosis not present

## 2021-01-26 DIAGNOSIS — I2510 Atherosclerotic heart disease of native coronary artery without angina pectoris: Secondary | ICD-10-CM | POA: Diagnosis not present

## 2021-01-26 DIAGNOSIS — J309 Allergic rhinitis, unspecified: Secondary | ICD-10-CM | POA: Diagnosis not present

## 2021-01-26 DIAGNOSIS — K9089 Other intestinal malabsorption: Secondary | ICD-10-CM | POA: Diagnosis not present

## 2021-01-26 DIAGNOSIS — E1122 Type 2 diabetes mellitus with diabetic chronic kidney disease: Secondary | ICD-10-CM | POA: Diagnosis not present

## 2021-01-26 DIAGNOSIS — E039 Hypothyroidism, unspecified: Secondary | ICD-10-CM | POA: Diagnosis not present

## 2021-01-26 DIAGNOSIS — K219 Gastro-esophageal reflux disease without esophagitis: Secondary | ICD-10-CM | POA: Diagnosis not present

## 2021-01-26 DIAGNOSIS — E785 Hyperlipidemia, unspecified: Secondary | ICD-10-CM | POA: Diagnosis not present

## 2021-01-26 DIAGNOSIS — F411 Generalized anxiety disorder: Secondary | ICD-10-CM | POA: Diagnosis not present

## 2021-01-26 DIAGNOSIS — Z91030 Bee allergy status: Secondary | ICD-10-CM | POA: Diagnosis not present

## 2021-01-26 DIAGNOSIS — I10 Essential (primary) hypertension: Secondary | ICD-10-CM | POA: Diagnosis not present

## 2021-01-26 DIAGNOSIS — Z7984 Long term (current) use of oral hypoglycemic drugs: Secondary | ICD-10-CM | POA: Diagnosis not present

## 2021-01-26 DIAGNOSIS — K529 Noninfective gastroenteritis and colitis, unspecified: Secondary | ICD-10-CM | POA: Diagnosis not present

## 2021-02-02 DIAGNOSIS — R809 Proteinuria, unspecified: Secondary | ICD-10-CM | POA: Diagnosis not present

## 2021-02-16 DIAGNOSIS — N183 Chronic kidney disease, stage 3 unspecified: Secondary | ICD-10-CM | POA: Diagnosis not present

## 2021-02-16 DIAGNOSIS — E1142 Type 2 diabetes mellitus with diabetic polyneuropathy: Secondary | ICD-10-CM | POA: Diagnosis not present

## 2021-02-16 DIAGNOSIS — I10 Essential (primary) hypertension: Secondary | ICD-10-CM | POA: Diagnosis not present

## 2021-02-16 DIAGNOSIS — K219 Gastro-esophageal reflux disease without esophagitis: Secondary | ICD-10-CM | POA: Diagnosis not present

## 2021-02-16 DIAGNOSIS — F324 Major depressive disorder, single episode, in partial remission: Secondary | ICD-10-CM | POA: Diagnosis not present

## 2021-02-16 DIAGNOSIS — E039 Hypothyroidism, unspecified: Secondary | ICD-10-CM | POA: Diagnosis not present

## 2021-02-16 DIAGNOSIS — E785 Hyperlipidemia, unspecified: Secondary | ICD-10-CM | POA: Diagnosis not present

## 2021-02-16 DIAGNOSIS — M159 Polyosteoarthritis, unspecified: Secondary | ICD-10-CM | POA: Diagnosis not present

## 2021-02-16 DIAGNOSIS — I252 Old myocardial infarction: Secondary | ICD-10-CM | POA: Diagnosis not present

## 2021-02-16 DIAGNOSIS — E1165 Type 2 diabetes mellitus with hyperglycemia: Secondary | ICD-10-CM | POA: Diagnosis not present

## 2021-02-16 DIAGNOSIS — E1122 Type 2 diabetes mellitus with diabetic chronic kidney disease: Secondary | ICD-10-CM | POA: Diagnosis not present

## 2021-02-16 DIAGNOSIS — I2510 Atherosclerotic heart disease of native coronary artery without angina pectoris: Secondary | ICD-10-CM | POA: Diagnosis not present

## 2021-02-23 DIAGNOSIS — G5603 Carpal tunnel syndrome, bilateral upper limbs: Secondary | ICD-10-CM | POA: Diagnosis not present

## 2021-02-23 DIAGNOSIS — M25511 Pain in right shoulder: Secondary | ICD-10-CM | POA: Diagnosis not present

## 2021-03-15 DIAGNOSIS — C61 Malignant neoplasm of prostate: Secondary | ICD-10-CM | POA: Diagnosis not present

## 2021-03-16 DIAGNOSIS — N183 Chronic kidney disease, stage 3 unspecified: Secondary | ICD-10-CM | POA: Diagnosis not present

## 2021-03-16 DIAGNOSIS — E1142 Type 2 diabetes mellitus with diabetic polyneuropathy: Secondary | ICD-10-CM | POA: Diagnosis not present

## 2021-03-16 DIAGNOSIS — E1165 Type 2 diabetes mellitus with hyperglycemia: Secondary | ICD-10-CM | POA: Diagnosis not present

## 2021-03-16 DIAGNOSIS — I252 Old myocardial infarction: Secondary | ICD-10-CM | POA: Diagnosis not present

## 2021-03-16 DIAGNOSIS — E785 Hyperlipidemia, unspecified: Secondary | ICD-10-CM | POA: Diagnosis not present

## 2021-03-16 DIAGNOSIS — F324 Major depressive disorder, single episode, in partial remission: Secondary | ICD-10-CM | POA: Diagnosis not present

## 2021-03-16 DIAGNOSIS — I2510 Atherosclerotic heart disease of native coronary artery without angina pectoris: Secondary | ICD-10-CM | POA: Diagnosis not present

## 2021-03-16 DIAGNOSIS — G5603 Carpal tunnel syndrome, bilateral upper limbs: Secondary | ICD-10-CM | POA: Diagnosis not present

## 2021-03-16 DIAGNOSIS — I10 Essential (primary) hypertension: Secondary | ICD-10-CM | POA: Diagnosis not present

## 2021-03-16 DIAGNOSIS — K219 Gastro-esophageal reflux disease without esophagitis: Secondary | ICD-10-CM | POA: Diagnosis not present

## 2021-03-16 DIAGNOSIS — Z8546 Personal history of malignant neoplasm of prostate: Secondary | ICD-10-CM | POA: Diagnosis not present

## 2021-03-16 DIAGNOSIS — E039 Hypothyroidism, unspecified: Secondary | ICD-10-CM | POA: Diagnosis not present

## 2021-03-20 DIAGNOSIS — G5603 Carpal tunnel syndrome, bilateral upper limbs: Secondary | ICD-10-CM | POA: Diagnosis not present

## 2021-03-22 DIAGNOSIS — C61 Malignant neoplasm of prostate: Secondary | ICD-10-CM | POA: Diagnosis not present

## 2021-03-22 DIAGNOSIS — N393 Stress incontinence (female) (male): Secondary | ICD-10-CM | POA: Diagnosis not present

## 2021-03-30 DIAGNOSIS — G5603 Carpal tunnel syndrome, bilateral upper limbs: Secondary | ICD-10-CM | POA: Diagnosis not present

## 2021-04-15 DIAGNOSIS — M25511 Pain in right shoulder: Secondary | ICD-10-CM | POA: Diagnosis not present

## 2021-04-19 ENCOUNTER — Telehealth: Payer: Self-pay | Admitting: *Deleted

## 2021-04-19 DIAGNOSIS — M75120 Complete rotator cuff tear or rupture of unspecified shoulder, not specified as traumatic: Secondary | ICD-10-CM | POA: Diagnosis not present

## 2021-04-19 DIAGNOSIS — M25511 Pain in right shoulder: Secondary | ICD-10-CM | POA: Diagnosis not present

## 2021-04-19 NOTE — Telephone Encounter (Signed)
   [facility] Pre-operative Risk Assessment    Patient Name: Paul [name]  DOB: 1945-12-10  MRN: [id number]   Request for surgical clearance:  What type of surgery is being performed? Right reverse total shoulder   When is this surgery scheduled? TBD-August?   What type of clearance is required (medical clearance vs. Pharmacy clearance to hold med vs. Both)? medical  Are there any medications that need to be held prior to surgery and how long?  N/A   Practice name and name of physician performing surgery? [facility] Dr. Veverly Fells   What is the office phone number? [phone]   7.   What is the office fax number? [phone] attn: Caryl Pina  8.   Anesthesia type (None, local, MAC, general) ? general   Yunuen Mordan A Ozzie Remmers 04/19/2021, 1:53 PM  _________________________________________________________________

## 2021-04-19 NOTE — Telephone Encounter (Signed)
Pt sees Almyra Deforest on 7/13 - he will address clearance at that visit.   Please let requesting office know.

## 2021-05-02 DIAGNOSIS — G5601 Carpal tunnel syndrome, right upper limb: Secondary | ICD-10-CM | POA: Diagnosis not present

## 2021-05-10 ENCOUNTER — Other Ambulatory Visit: Payer: Self-pay

## 2021-05-10 ENCOUNTER — Encounter: Payer: Self-pay | Admitting: Physician Assistant

## 2021-05-10 ENCOUNTER — Ambulatory Visit: Payer: PPO | Admitting: Physician Assistant

## 2021-05-10 VITALS — BP 120/76 | HR 84 | Ht 69.0 in | Wt 202.4 lb

## 2021-05-10 DIAGNOSIS — I10 Essential (primary) hypertension: Secondary | ICD-10-CM

## 2021-05-10 DIAGNOSIS — E785 Hyperlipidemia, unspecified: Secondary | ICD-10-CM | POA: Diagnosis not present

## 2021-05-10 DIAGNOSIS — I779 Disorder of arteries and arterioles, unspecified: Secondary | ICD-10-CM

## 2021-05-10 DIAGNOSIS — E119 Type 2 diabetes mellitus without complications: Secondary | ICD-10-CM

## 2021-05-10 DIAGNOSIS — Z01818 Encounter for other preprocedural examination: Secondary | ICD-10-CM

## 2021-05-10 DIAGNOSIS — I25810 Atherosclerosis of coronary artery bypass graft(s) without angina pectoris: Secondary | ICD-10-CM

## 2021-05-10 DIAGNOSIS — E039 Hypothyroidism, unspecified: Secondary | ICD-10-CM | POA: Diagnosis not present

## 2021-05-10 NOTE — Progress Notes (Signed)
Cardiology Office Note:    Date:  05/12/2021   ID:  YALE GOLLA, DOB [birth year], MRN [id number]  PCP:  [name], MD   [place] [facility] Cardiologist:  Peter Martinique, MD     Referring MD: [name], MD   Chief Complaint  Patient presents with   Pre-op Exam    Upcoming shoulder surgery.    History of Present Illness:    Paul [name] is a 75 y.o. male with a hx of CAD s/p CABG 2005, carotid artery disease, HTN, HLD, DM II, hypothyroidism and OSA.  Myoview obtained in November 2016 showed inferolateral infarct with peri-infarct ischemia.  Subsequent cardiac catheterization showed severe disease in SVG to OM 2 treated with stent, occluded SVG to PDA and distal RCA filled with left-to-right collaterals.  He had shortness of breath with Brilinta, after switching to Plavix, shortness of breath resolved.  He has persistent exertional fatigue.  Repeat cardiac catheterization in August 2018 showed patent grafts except for known occluded SVG to RCA, no new disease to explain his symptoms.  Carvedilol was reduced due to persistent symptoms of dizziness and fatigue.  Initially carvedilol was tapered off however this did not simply alleviate his symptoms, and was resumed.  Patient was last seen by Dr. Martinique in September 2021 at which time he was doing well.  He is planning to proceed with a right reverse total shoulder surgery by Dr. Alma Friendly of [facility].  Patient presents today for follow-up and preop clearance.  His dyspnea with exertion and fatigue has been improving lately.  He is still under great amount of stress due to family issues.  He denies any recent chest pain with exertion.  He has no problem achieving 4 METS of activity.  He is cleared to proceed with right shoulder surgery.  Otherwise she has no lower extremity edema, orthopnea or PND.  He is due for a carotid artery Doppler in October.  He has a scheduled follow-up with Dr. Martinique in December.  Past Medical  History:  Diagnosis Date   Allergy    Coronary artery disease    a. s/p CABG;  b. 08/2015 MV: inf/inflat infarct w/ new peri-infarct isch; c. LHC 11/16: pLAD 70, dLAD 50, D1 100, oRI 100, mLCx 60, OM2 99, pRCA 99 (L>R collats), S-RPDA 100, S-D1 ok, S-RI/OM2 mid 85 (PCI: Synergy DES), L-LAD ok, EF 55-65%.   Depression    GERD (gastroesophageal reflux disease)    Hyperlipidemia    Hypertensive heart disease    Hypothyroidism    Insomnia    OSA (obstructive sleep apnea)    CPAP   Prostate cancer ([place])    Type II diabetes mellitus ([place])     Past Surgical History:  Procedure Laterality Date   CARDIAC CATHETERIZATION N/A 09/19/2015   Procedure: Coronary Stent Intervention;  Surgeon: Peter M Martinique, MD;  Location: [facility];  Service: Cardiovascular;  Laterality: N/A;   CARDIAC CATHETERIZATION  09/19/2015   Procedure: Left Heart Cath and Cors/Grafts Angiography;  Surgeon: Peter M Martinique, MD;  Location: [facility];  Service: Cardiovascular;;   CHOLECYSTECTOMY     CORONARY ARTERY BYPASS GRAFT     JOINT REPLACEMENT     right knee   LEFT HEART CATH AND CORS/GRAFTS ANGIOGRAPHY N/A 06/26/2017   Procedure: LEFT HEART CATH AND CORS/GRAFTS ANGIOGRAPHY;  Surgeon: Martinique, Peter M, MD;  Location: [facility];  Service: Cardiovascular;  Laterality: N/A;   PROSTATE SURGERY  Current Medications: Current Meds  Medication Sig   acetaminophen (TYLENOL) 650 MG CR tablet Take 1,300 mg by mouth every 8 (eight) hours as needed for pain.   amLODipine (NORVASC) 5 MG tablet Take 1 tablet (5 mg total) by mouth 2 (two) times daily.   aspirin EC 81 MG tablet Take 81 mg by mouth daily.   carvedilol (COREG) 12.5 MG tablet Take 1 tablet by mouth twice daily   Cholecalciferol (VITAMIN D) 2000 units tablet Take 2,000 Units by mouth daily.   clindamycin (CLEOCIN) 150 MG capsule Take 300 mg by mouth See admin instructions. Take 2 capsules (300 mg) 1 hour before dental procedure.    colestipol (COLESTID) 1 G tablet Take 1 g by mouth at bedtime.    doxycycline (VIBRAMYCIN) 100 MG capsule Take 100 mg by mouth 2 (two) times daily.   DULoxetine (CYMBALTA) 30 MG capsule Take by mouth in the morning and at bedtime.   empagliflozin (JARDIANCE) 10 MG TABS tablet Take 10 mg by mouth daily. (Patient taking differently: Take 100 mg by mouth daily.)   EPINEPHrine 0.3 mg/0.3 mL IJ SOAJ injection Inject 0.3 mg into the muscle daily as needed (for anaphylatic allergic reaction.).   fluticasone (FLONASE) 50 MCG/ACT nasal spray Place 2 sprays into both nostrils daily as needed for allergies or rhinitis.   glipiZIDE (GLUCOTROL) 5 MG tablet Take 5 mg by mouth 2 (two) times daily.   guaiFENesin (MUCINEX) 600 MG 12 hr tablet Take 600-1,200 mg by mouth 2 (two) times daily as needed (for sinus drainage/cough/congestion).   levothyroxine (SYNTHROID) 88 MCG tablet Take 88 mcg by mouth every morning.   liothyronine (CYTOMEL) 5 MCG tablet Take 5 mcg by mouth daily with breakfast.    loperamide (IMODIUM A-D) 2 MG tablet Take 2-4 mg by mouth 4 (four) times daily as needed for diarrhea or loose stools.   losartan (COZAAR) 100 MG tablet TAKE 1 TABLET BY MOUTH ONCE DAILY.   metFORMIN (GLUCOPHAGE) 1000 MG tablet Take 1 tablet (1,000 mg total) by mouth 2 (two) times daily.   Multiple Vitamin (MULTIVITAMIN) tablet Take 1 tablet by mouth daily. Reported on 11/10/2015   nitroGLYCERIN (NITROSTAT) 0.4 MG SL tablet Place 1 tablet (0.4 mg total) under the tongue every 5 (five) minutes as needed for chest pain.   pantoprazole (PROTONIX) 20 MG tablet Take 1 tablet (20 mg total) by mouth daily.   potassium citrate (UROCIT-K) 10 MEQ (1080 MG) SR tablet Take 10 mEq by mouth 2 (two) times daily.    rosuvastatin (CRESTOR) 10 MG tablet Take 1 tablet by mouth once daily   traZODone (DESYREL) 50 MG tablet Take 50 mg by mouth at bedtime.   triamcinolone cream (KENALOG) 0.1 % Apply 1 application topically as directed.   vitamin  B-12 (CYANOCOBALAMIN) 250 MCG tablet Take 250 mcg by mouth daily.   [DISCONTINUED] levothyroxine (SYNTHROID, LEVOTHROID) 75 MCG tablet Take 88 mcg by mouth daily before breakfast.     Allergies:   Brilinta [ticagrelor], Yellow jacket venom, Morphine, Penicillins, Percocet [oxycodone-acetaminophen], Septra ds [sulfamethoxazole-trimethoprim], Vicodin [hydrocodone-acetaminophen], and Alprazolam   Social History   Socioeconomic History   Marital status: Married    Spouse name: Not on file   Number of children: Not on file   Years of education: Not on file   Highest education level: Not on file  Occupational History   Not on file  Tobacco Use   Smoking status: Never   Smokeless tobacco: Never  Substance and Sexual Activity  Alcohol use: No    Alcohol/week: 0.0 standard drinks   Drug use: No   Sexual activity: Not on file  Other Topics Concern   Not on file  Social History Narrative   Not on file   Social Determinants of Health   Financial Resource Strain: Not on file  Food Insecurity: Not on file  Transportation Needs: Not on file  Physical Activity: Not on file  Stress: Not on file  Social Connections: Not on file     Family History: The patient's family history includes Heart attack in his father; Heart disease in his mother; Vascular Disease in his sister.  ROS:   Please see the history of present illness.     All other systems reviewed and are negative.  EKGs/Labs/Other Studies Reviewed:    The following studies were reviewed today:  Cath 06/26/2017 Prox RCA to Dist RCA lesion, 100 %stenosed. Prox LAD to Mid LAD lesion, 70 %stenosed. Dist LAD lesion, 50 %stenosed. 1st Diag lesion, 100 %stenosed. Ost Ramus lesion, 100 %stenosed. 2nd Mrg lesion, 99 %stenosed. Mid Cx to Dist Cx lesion, 60 %stenosed. SVG to RCA- Origin lesion, 100 %stenosed. SVG sequential to ramus intermediate and OM is widely patent. SVG to diagonal is patent. LIMA and is normal in  caliber. LV end diastolic pressure is normal.   1. Severe 3 vessel obstructive CAD 2. Patent LIMA to the LAD 3. Patent SVG to ramus and OM branches sequentially. Stent is widely patent 4. Patent SVG to diagonal 5. SVG to RCA known to be occluded. 6. Normal LVEDP   Plan: no new disease to explain symptoms. Recommend continued medical therapy.  EKG:  EKG is ordered today.  The ekg ordered today demonstrates NSR without significant ST-T wave changes  Recent Labs: No results found for requested labs within last 8760 hours.  Recent Lipid Panel    Component Value Date/Time   CHOL 162 06/24/2018 0848   TRIG 211 (H) 06/24/2018 0848   HDL 32 (L) 06/24/2018 0848   CHOLHDL 4.5 08/05/2017 0835   CHOLHDL 4.5 07/20/2016 0950   VLDL 47 (H) 07/20/2016 0950   LDLCALC 88 06/24/2018 0848   LDLDIRECT 76.0 02/28/2015 0939     Risk Assessment/Calculations:           Physical Exam:    VS:  BP 120/76 (BP Location: Left Arm)   Pulse 84   Ht 5\' 9"  (1.753 m)   Wt 202 lb 6.4 oz (91.8 kg)   BMI 29.89 kg/m     Wt Readings from Last 3 Encounters:  05/10/21 202 lb 6.4 oz (91.8 kg)  07/12/20 205 lb (93 kg)  06/27/18 216 lb 3.2 oz (98.1 kg)     GEN:  Well nourished, well developed in no acute distress HEENT: Normal NECK: No JVD; No carotid bruits LYMPHATICS: No lymphadenopathy CARDIAC: RRR, no murmurs, rubs, gallops RESPIRATORY:  Clear to auscultation without rales, wheezing or rhonchi  ABDOMEN: Soft, non-tender, non-distended MUSCULOSKELETAL:  No edema; No deformity  SKIN: Warm and dry NEUROLOGIC:  Alert and oriented x 3 PSYCHIATRIC:  Normal affect   ASSESSMENT:    1. Preop examination   2. Coronary artery disease involving coronary bypass graft of native heart without angina pectoris   3. Bilateral carotid artery disease, unspecified type ([place])   4. Essential hypertension   5. Hyperlipidemia LDL goal <70   6. Controlled type 2 diabetes mellitus without complication, without  long-term current use of insulin ([place])   7. Hypothyroidism, unspecified type  PLAN:    In order of problems listed above:  Preoperative clearance: Patient has upcoming shoulder repair.  This is a low risk surgery.  He has not had any recent chest pain or worsening shortness of breath.  He is able to accomplish more than 4 METS of activity without any issue.  Patient is cleared to proceed with surgery without further work-up.  CAD: Denies any recent chest pain.  On aspirin and carvedilol  Hypertension: Blood pressure stable  Hyperlipidemia: Continue Crestor  DM2: Managed by primary care provider  Hypothyroidism on levothyroxine.         Medication Adjustments/Labs and Tests Ordered: Current medicines are reviewed at length with the patient today.  Concerns regarding medicines are outlined above.  Orders Placed This Encounter  Procedures   EKG 12-Lead   No orders of the defined types were placed in this encounter.   Patient Instructions  Medication Instructions:  Your physician recommends that you continue on your current medications as directed. Please refer to the Current Medication list given to you today.  *If you need a refill on your cardiac medications before your next appointment, please call your pharmacy*  Lab Work: NONE ordered at this time of appointment   If you have labs (blood work) drawn today and your tests are completely normal, you will receive your results only by: [place] (if you have MyChart) OR A paper copy in the mail If you have any lab test that is abnormal or we need to change your treatment, we will call you to review the results.  Testing/Procedures: NONE ordered at this time of appointment   Follow-Up: At [facility], you and your health needs are our priority.  As part of our continuing mission to provide you with exceptional heart care, we have created designated Provider Care Teams.  These Care Teams include your primary  Cardiologist (physician) and Advanced Practice Providers (APPs -  Physician Assistants and Nurse Practitioners) who all work together to provide you with the care you need, when you need it.  Your next appointment:   As scheduled    The format for your next appointment:   In Person  Provider:   Peter Martinique, MD  Other Instructions    Signed, Almyra Deforest, [place]  05/12/2021 3:11 PM    [place]

## 2021-05-10 NOTE — Patient Instructions (Signed)
Medication Instructions:  Your physician recommends that you continue on your current medications as directed. Please refer to the Current Medication list given to you today.  *If you need a refill on your cardiac medications before your next appointment, please call your pharmacy*  Lab Work: NONE ordered at this time of appointment   If you have labs (blood work) drawn today and your tests are completely normal, you will receive your results only by: [place] (if you have MyChart) OR A paper copy in the mail If you have any lab test that is abnormal or we need to change your treatment, we will call you to review the results.  Testing/Procedures: NONE ordered at this time of appointment   Follow-Up: At [facility], you and your health needs are our priority.  As part of our continuing mission to provide you with exceptional heart care, we have created designated Provider Care Teams.  These Care Teams include your primary Cardiologist (physician) and Advanced Practice Providers (APPs -  Physician Assistants and Nurse Practitioners) who all work together to provide you with the care you need, when you need it.  Your next appointment:   As scheduled    The format for your next appointment:   In Person  Provider:   Peter Martinique, MD  Other Instructions

## 2021-05-12 ENCOUNTER — Encounter: Payer: Self-pay | Admitting: Physician Assistant

## 2021-05-16 DIAGNOSIS — Z4789 Encounter for other orthopedic aftercare: Secondary | ICD-10-CM | POA: Diagnosis not present

## 2021-05-16 DIAGNOSIS — G5602 Carpal tunnel syndrome, left upper limb: Secondary | ICD-10-CM | POA: Diagnosis not present

## 2021-05-25 DIAGNOSIS — H02831 Dermatochalasis of right upper eyelid: Secondary | ICD-10-CM | POA: Diagnosis not present

## 2021-05-25 DIAGNOSIS — G43109 Migraine with aura, not intractable, without status migrainosus: Secondary | ICD-10-CM | POA: Diagnosis not present

## 2021-05-25 DIAGNOSIS — E119 Type 2 diabetes mellitus without complications: Secondary | ICD-10-CM | POA: Diagnosis not present

## 2021-05-25 DIAGNOSIS — Z961 Presence of intraocular lens: Secondary | ICD-10-CM | POA: Diagnosis not present

## 2021-05-25 DIAGNOSIS — H02834 Dermatochalasis of left upper eyelid: Secondary | ICD-10-CM | POA: Diagnosis not present

## 2021-05-26 DIAGNOSIS — Z0000 Encounter for general adult medical examination without abnormal findings: Secondary | ICD-10-CM | POA: Diagnosis not present

## 2021-05-26 DIAGNOSIS — Z1389 Encounter for screening for other disorder: Secondary | ICD-10-CM | POA: Diagnosis not present

## 2021-05-29 DIAGNOSIS — M25472 Effusion, left ankle: Secondary | ICD-10-CM | POA: Diagnosis not present

## 2021-05-29 DIAGNOSIS — Z6830 Body mass index (BMI) 30.0-30.9, adult: Secondary | ICD-10-CM | POA: Diagnosis not present

## 2021-06-02 NOTE — Progress Notes (Signed)
Sent message, via epic in basket, requesting orders in epic from surgeon.  

## 2021-06-06 ENCOUNTER — Other Ambulatory Visit: Payer: Self-pay

## 2021-06-06 ENCOUNTER — Ambulatory Visit (INDEPENDENT_AMBULATORY_CARE_PROVIDER_SITE_OTHER): Payer: PPO | Admitting: Otolaryngology

## 2021-06-06 DIAGNOSIS — J310 Chronic rhinitis: Secondary | ICD-10-CM

## 2021-06-06 DIAGNOSIS — H6123 Impacted cerumen, bilateral: Secondary | ICD-10-CM | POA: Diagnosis not present

## 2021-06-06 DIAGNOSIS — R490 Dysphonia: Secondary | ICD-10-CM | POA: Diagnosis not present

## 2021-06-06 NOTE — Progress Notes (Signed)
HPI: Paul [name] is a 75 y.o. male who presents for evaluation of wax buildup in his ears.  He wears bilateral hearing aids.  He used to be followed by Dr. Ernesto Rutherford.  He has always had a chronic hoarse voice.  He wanted his vocal cords checked also.  He apparently has had previous surgery on his voice at one time.  His voice has been fairly stable over the past year.  Some days it is better than the other days.  He has also had history of sinus issues and uses Flonase on a regular basis.  Past Medical History:  Diagnosis Date   Allergy    Coronary artery disease    a. s/p CABG;  b. 08/2015 MV: inf/inflat infarct w/ new peri-infarct isch; c. LHC 11/16: pLAD 70, dLAD 50, D1 100, oRI 100, mLCx 60, OM2 99, pRCA 99 (L>R collats), S-RPDA 100, S-D1 ok, S-RI/OM2 mid 85 (PCI: Synergy DES), L-LAD ok, EF 55-65%.   Depression    GERD (gastroesophageal reflux disease)    Hyperlipidemia    Hypertensive heart disease    Hypothyroidism    Insomnia    OSA (obstructive sleep apnea)    CPAP   Prostate cancer ([place])    Type II diabetes mellitus ([place])    Past Surgical History:  Procedure Laterality Date   CARDIAC CATHETERIZATION N/A 09/19/2015   Procedure: Coronary Stent Intervention;  Surgeon: Peter M Martinique, MD;  Location: [facility];  Service: Cardiovascular;  Laterality: N/A;   CARDIAC CATHETERIZATION  09/19/2015   Procedure: Left Heart Cath and Cors/Grafts Angiography;  Surgeon: Peter M Martinique, MD;  Location: [facility];  Service: Cardiovascular;;   CHOLECYSTECTOMY     CORONARY ARTERY BYPASS GRAFT     JOINT REPLACEMENT     right knee   LEFT HEART CATH AND CORS/GRAFTS ANGIOGRAPHY N/A 06/26/2017   Procedure: LEFT HEART CATH AND CORS/GRAFTS ANGIOGRAPHY;  Surgeon: Martinique, Peter M, MD;  Location: [facility];  Service: Cardiovascular;  Laterality: N/A;   PROSTATE SURGERY     Social History   Socioeconomic History   Marital status: Married    Spouse name: Not on file    Number of children: Not on file   Years of education: Not on file   Highest education level: Not on file  Occupational History   Not on file  Tobacco Use   Smoking status: Never   Smokeless tobacco: Never  Substance and Sexual Activity   Alcohol use: No    Alcohol/week: 0.0 standard drinks   Drug use: No   Sexual activity: Not on file  Other Topics Concern   Not on file  Social History Narrative   Not on file   Social Determinants of Health   Financial Resource Strain: Not on file  Food Insecurity: Not on file  Transportation Needs: Not on file  Physical Activity: Not on file  Stress: Not on file  Social Connections: Not on file   Family History  Problem Relation Age of Onset   Heart disease Mother    Heart attack Father    Vascular Disease Sister    Allergies  Allergen Reactions   Brilinta [Ticagrelor] Shortness Of Breath   Yellow Jacket Venom Anaphylaxis   Morphine Other (See Comments)    Hallucinations/nightmares when taking for greater than 24hrs.   Penicillins Other (See Comments)    "Eyes swell shut" Has patient had a PCN reaction causing immediate rash, facial/tongue/throat swelling, SOB or lightheadedness  with hypotension:Yes Has patient had a PCN reaction causing severe rash involving mucus membranes or skin necrosis:No Has patient had a PCN reaction that required hospitalization: No Has patient had a PCN reaction occurring within the last 10 years: No If all of the above answers are "NO", then may proceed with Cephalosporin use.    Percocet [Oxycodone-Acetaminophen]     Nightmares/hallucinations   Septra Ds [Sulfamethoxazole-Trimethoprim]     rash   Vicodin [Hydrocodone-Acetaminophen] Other (See Comments)    Hallucinations/nightmares   Alprazolam Rash   Prior to Admission medications   Medication Sig Start Date End Date Taking? Authorizing Provider  acetaminophen (TYLENOL) 650 MG CR tablet Take 1,300 mg by mouth every 8 (eight) hours as needed for  pain.    [provider]  amLODipine (NORVASC) 5 MG tablet Take 1 tablet (5 mg total) by mouth 2 (two) times daily. 07/12/20 07/07/21  Martinique, Peter M, MD  aspirin EC 81 MG tablet Take 81 mg by mouth daily.    [provider]  carvedilol (COREG) 12.5 MG tablet Take 1 tablet by mouth twice daily 11/17/19   Martinique, Peter M, MD  Cholecalciferol (VITAMIN D) 2000 units tablet Take 2,000 Units by mouth daily.    [provider]  clindamycin (CLEOCIN) 150 MG capsule Take 300 mg by mouth See admin instructions. Take 2 capsules (300 mg) 1 hour before dental procedure. 09/30/15   [provider]  colestipol (COLESTID) 1 G tablet Take 1 g by mouth at bedtime.     [provider]  doxycycline (VIBRAMYCIN) 100 MG capsule Take 100 mg by mouth 2 (two) times daily.    [provider]  DULoxetine (CYMBALTA) 30 MG capsule Take by mouth in the morning and at bedtime. 05/20/20   [provider]  empagliflozin (JARDIANCE) 10 MG TABS tablet Take 10 mg by mouth daily. Patient taking differently: Take 100 mg by mouth daily. 01/29/19   Martinique, Peter M, MD  EPINEPHrine 0.3 mg/0.3 mL IJ SOAJ injection Inject 0.3 mg into the muscle daily as needed (for anaphylatic allergic reaction.).    [provider]  fluticasone (FLONASE) 50 MCG/ACT nasal spray Place 2 sprays into both nostrils daily as needed for allergies or rhinitis.    [provider]  glipiZIDE (GLUCOTROL) 5 MG tablet Take 5 mg by mouth 2 (two) times daily. 06/25/20   [provider]  guaiFENesin (MUCINEX) 600 MG 12 hr tablet Take 600-1,200 mg by mouth 2 (two) times daily as needed (for sinus drainage/cough/congestion).    [provider]  levothyroxine (SYNTHROID) 88 MCG tablet Take 88 mcg by mouth every morning. 03/02/21   [provider]  liothyronine (CYTOMEL) 5 MCG tablet Take 5 mcg by mouth daily with breakfast.  07/20/15   [provider]  loperamide  (IMODIUM A-D) 2 MG tablet Take 2-4 mg by mouth 4 (four) times daily as needed for diarrhea or loose stools.    [provider]  losartan (COZAAR) 100 MG tablet TAKE 1 TABLET BY MOUTH ONCE DAILY. 07/12/20   Martinique, Peter M, MD  metFORMIN (GLUCOPHAGE) 1000 MG tablet Take 1 tablet (1,000 mg total) by mouth 2 (two) times daily. 06/26/17   Martinique, Peter M, MD  Multiple Vitamin (MULTIVITAMIN) tablet Take 1 tablet by mouth daily. Reported on 11/10/2015    [provider]  nitroGLYCERIN (NITROSTAT) 0.4 MG SL tablet Place 1 tablet (0.4 mg total) under the tongue every 5 (five) minutes as needed for chest pain. 07/12/20  Martinique, Peter M, MD  pantoprazole (PROTONIX) 20 MG tablet Take 1 tablet (20 mg total) by mouth daily. 11/16/16   Martinique, Peter M, MD  potassium citrate (UROCIT-K) 10 MEQ (1080 MG) SR tablet Take 10 mEq by mouth 2 (two) times daily.  02/16/15   [provider]  rosuvastatin (CRESTOR) 10 MG tablet Take 1 tablet by mouth once daily 06/20/20   Martinique, Peter M, MD  traZODone (DESYREL) 50 MG tablet Take 50 mg by mouth at bedtime. 03/02/21   [provider]  triamcinolone cream (KENALOG) 0.1 % Apply 1 application topically as directed.    [provider]  vitamin B-12 (CYANOCOBALAMIN) 250 MCG tablet Take 250 mcg by mouth daily.    [provider]     Positive ROS: Otherwise negative  All other systems have been reviewed and were otherwise negative with the exception of those mentioned in the HPI and as above.  Physical Exam: Constitutional: Alert, well-appearing, no acute distress.  Patient with mild to moderate hoarseness which has been chronic. Ears: External ears without lesions or tenderness.  Right ear canal smaller than the left ear canal.  Ear canals were cleaned with forceps curettes and suction.  After cleaning the ear canals the TMs were clear bilaterally.  He wears bilateral hearing aids but does not have them with him today. Nasal:  External nose without lesions. Septum with mild deformity.  Both millimeters regions were clear after decongesting the nose.  No polyps noted no mucopurulent discharge noted..  Oral: Lips and gums without lesions. Tongue and palate mucosa without lesions. Posterior oropharynx clear.  Indirect laryngoscopy revealed a clear base of tongue vallecula and epiglottis.  Vocal cords were little bit difficult to evaluate.  Fiberoptic laryngoscopy was performed through the right nostril.  The middle meatus region was clear.  The nasopharynx was clear.  The vallecula and epiglottis were normal.  The vocal cords were mildly edematous with slightly worse movement of the right vocal compared to the left.  But there was no evidence of neoplasm or abnormal growth. Neck: No palpable adenopathy or masses Respiratory: Breathing comfortably  Skin: No facial/neck lesions or rash noted.  Cerumen impaction removal  Date/Time: 06/06/2021 6:04 PM Performed by: Rozetta Nunnery, MD Authorized by: Rozetta Nunnery, MD   Consent:    Consent obtained:  Verbal   Consent given by:  Patient   Risks discussed:  Pain and bleeding Procedure details:    Location:  L ear and R ear   Procedure type: curette, suction and forceps   Post-procedure details:    Inspection:  TM intact and canal normal   Hearing quality:  Improved   Procedure completion:  Tolerated well, no immediate complications Comments:     TMs are clear bilaterally. Laryngoscopy  Date/Time: 06/06/2021 6:05 PM Performed by: Rozetta Nunnery, MD Authorized by: Rozetta Nunnery, MD   Consent:    Consent obtained:  Verbal   Consent given by:  Patient Procedure details:    Indications: hoarseness, dysphagia, or aspiration     Medication:  Afrin   Instrument: flexible fiberoptic laryngoscope     Scope location: right nare   Sinus:    Right middle meatus: normal     Right nasopharynx: normal   Mouth:    Oropharynx: normal     Vallecula:  normal     Base of tongue: normal     Epiglottis: normal   Throat:    True vocal cords: normal  Comments:     On evaluation of vocal cords he has slight edema and mild erythema with slightly decreased mobility on the right side compared to the left but no specific evidence of neoplasm or abnormal lesions.  Assessment: Chronic hoarseness which has been stable with chronic vocal cord changes but no evidence of neoplasm. Cerumen buildup in patient who wears hearing aids Chronic rhinitis with no signs of infection.  Plan: Would recommend continuation with the use of Flonase and saline rinses as needed nasal symptoms. Reassured him of no evidence of neoplasm on vocal cord examination with chronic changes noted. Ear canals were cleaned in the office today. He will follow-up as needed  Radene Journey, MD

## 2021-06-07 NOTE — H&P (Signed)
Patient's anticipated LOS is less than 2 midnights, meeting these requirements: - Younger than 65 - Lives within 1 hour of care - Has a competent adult at home to recover with post-op recover - NO history of  - Chronic pain requiring opiods  - Diabetes  - Coronary Artery Disease  - Heart failure  - Heart attack  - Stroke  - DVT/VTE  - Cardiac arrhythmia  - Respiratory Failure/COPD  - Renal failure  - Anemia  - Advanced Liver disease     Paul [name] is an 75 y.o. male.    Chief Complaint: right shoulder pain  HPI: Pt is a 75 y.o. male complaining of right shoulder pain for multiple years. Pain had continually increased since the beginning. X-rays in the clinic show end-stage arthritic changes of the right shoulder. Pt has tried various conservative treatments which have failed to alleviate their symptoms, including injections and therapy. Various options are discussed with the patient. Risks, benefits and expectations were discussed with the patient. Patient understand the risks, benefits and expectations and wishes to proceed with surgery.   PCP:  [name], MD  D/C Plans: Home  PMH: Past Medical History:  Diagnosis Date   Allergy    Coronary artery disease    a. s/p CABG;  b. 08/2015 MV: inf/inflat infarct w/ new peri-infarct isch; c. LHC 11/16: pLAD 70, dLAD 50, D1 100, oRI 100, mLCx 60, OM2 99, pRCA 99 (L>R collats), S-RPDA 100, S-D1 ok, S-RI/OM2 mid 85 (PCI: Synergy DES), L-LAD ok, EF 55-65%.   Depression    GERD (gastroesophageal reflux disease)    Hyperlipidemia    Hypertensive heart disease    Hypothyroidism    Insomnia    OSA (obstructive sleep apnea)    CPAP   Prostate cancer (HCC)    Type II diabetes mellitus (HCC)     PSH: Past Surgical History:  Procedure Laterality Date   CARDIAC CATHETERIZATION N/A 09/19/2015   Procedure: Coronary Stent Intervention;  Surgeon: Peter M Martinique, MD;  Location: [facility];  Service: Cardiovascular;   Laterality: N/A;   CARDIAC CATHETERIZATION  09/19/2015   Procedure: Left Heart Cath and Cors/Grafts Angiography;  Surgeon: Peter M Martinique, MD;  Location: [facility];  Service: Cardiovascular;;   CHOLECYSTECTOMY     CORONARY ARTERY BYPASS GRAFT     JOINT REPLACEMENT     right knee   LEFT HEART CATH AND CORS/GRAFTS ANGIOGRAPHY N/A 06/26/2017   Procedure: LEFT HEART CATH AND CORS/GRAFTS ANGIOGRAPHY;  Surgeon: Martinique, Peter M, MD;  Location: [facility];  Service: Cardiovascular;  Laterality: N/A;   PROSTATE SURGERY      Social History:  reports that he has never smoked. He has never used smokeless tobacco. He reports that he does not drink alcohol and does not use drugs.  Allergies:  Allergies  Allergen Reactions   Brilinta [Ticagrelor] Shortness Of Breath   Yellow Jacket Venom Anaphylaxis   Morphine Other (See Comments)    Hallucinations/nightmares when taking for greater than 24hrs.   Penicillins Other (See Comments)    "Eyes swell shut" Has patient had a PCN reaction causing immediate rash, facial/tongue/throat swelling, SOB or lightheadedness with hypotension:Yes Has patient had a PCN reaction causing severe rash involving mucus membranes or skin necrosis:No Has patient had a PCN reaction that required hospitalization: No Has patient had a PCN reaction occurring within the last 10 years: No If all of the above answers are "NO", then may proceed with  Cephalosporin use.    Percocet [Oxycodone-Acetaminophen]     Nightmares/hallucinations   Septra Ds [Sulfamethoxazole-Trimethoprim]     rash   Vicodin [Hydrocodone-Acetaminophen] Other (See Comments)    Hallucinations/nightmares   Alprazolam Rash    Medications: No current facility-administered medications for this encounter.   Current Outpatient Medications  Medication Sig Dispense Refill   acetaminophen (TYLENOL) 650 MG CR tablet Take 1,300 mg by mouth every 8 (eight) hours as needed for pain.     amLODipine  (NORVASC) 5 MG tablet Take 1 tablet (5 mg total) by mouth 2 (two) times daily. 180 tablet 3   aspirin EC 81 MG tablet Take 81 mg by mouth daily.     carvedilol (COREG) 12.5 MG tablet Take 1 tablet by mouth twice daily 180 tablet 2   Cholecalciferol (VITAMIN D) 2000 units tablet Take 2,000 Units by mouth daily.     clindamycin (CLEOCIN) 150 MG capsule Take 300 mg by mouth See admin instructions. Take 2 capsules (300 mg) 1 hour before dental procedure.     colestipol (COLESTID) 1 G tablet Take 1 g by mouth at bedtime.      doxycycline (VIBRAMYCIN) 100 MG capsule Take 100 mg by mouth 2 (two) times daily.     DULoxetine (CYMBALTA) 30 MG capsule Take by mouth in the morning and at bedtime.     empagliflozin (JARDIANCE) 10 MG TABS tablet Take 10 mg by mouth daily. (Patient taking differently: Take 100 mg by mouth daily.) 30 tablet    EPINEPHrine 0.3 mg/0.3 mL IJ SOAJ injection Inject 0.3 mg into the muscle daily as needed (for anaphylatic allergic reaction.).     fluticasone (FLONASE) 50 MCG/ACT nasal spray Place 2 sprays into both nostrils daily as needed for allergies or rhinitis.     glipiZIDE (GLUCOTROL) 5 MG tablet Take 5 mg by mouth 2 (two) times daily.     guaiFENesin (MUCINEX) 600 MG 12 hr tablet Take 600-1,200 mg by mouth 2 (two) times daily as needed (for sinus drainage/cough/congestion).     levothyroxine (SYNTHROID) 88 MCG tablet Take 88 mcg by mouth every morning.     liothyronine (CYTOMEL) 5 MCG tablet Take 5 mcg by mouth daily with breakfast.      loperamide (IMODIUM A-D) 2 MG tablet Take 2-4 mg by mouth 4 (four) times daily as needed for diarrhea or loose stools.     losartan (COZAAR) 100 MG tablet TAKE 1 TABLET BY MOUTH ONCE DAILY. 90 tablet 3   metFORMIN (GLUCOPHAGE) 1000 MG tablet Take 1 tablet (1,000 mg total) by mouth 2 (two) times daily.     Multiple Vitamin (MULTIVITAMIN) tablet Take 1 tablet by mouth daily. Reported on 11/10/2015     nitroGLYCERIN (NITROSTAT) 0.4 MG SL tablet  Place 1 tablet (0.4 mg total) under the tongue every 5 (five) minutes as needed for chest pain. 25 tablet 6   pantoprazole (PROTONIX) 20 MG tablet Take 1 tablet (20 mg total) by mouth daily. 90 tablet 0   potassium citrate (UROCIT-K) 10 MEQ (1080 MG) SR tablet Take 10 mEq by mouth 2 (two) times daily.      rosuvastatin (CRESTOR) 10 MG tablet Take 1 tablet by mouth once daily 90 tablet 1   traZODone (DESYREL) 50 MG tablet Take 50 mg by mouth at bedtime.     triamcinolone cream (KENALOG) 0.1 % Apply 1 application topically as directed.     vitamin B-12 (CYANOCOBALAMIN) 250 MCG tablet Take 250 mcg by mouth daily.  No results found for this or any previous visit (from the past 48 hour(s)). No results found.  ROS: Pain with rom of the right upper extremity  Physical Exam: Alert and oriented 75 y.o. male in no acute distress Cranial nerves 2-12 intact Cervical spine: full rom with no tenderness, nv intact distally Chest: active breath sounds bilaterally, no wheeze rhonchi or rales Heart: regular rate and rhythm, no murmur Abd: non tender non distended with active bowel sounds Hip is stable with rom  Right shoulder pain and weakness with rom Nv intact distally No rashes or edema distally  Assessment/Plan Assessment: right shoulder cuff arthropathy  Plan:  Patient will undergo a right reverse total shoulder by Dr. Veverly Fells at [facility] R[facility] benefits and expectations were discussed with the patient. Patient understand risks, benefits and expectations and wishes to proceed. Preoperative templating of the joint replacement has been completed, documented, and submitted to the Operating Room personnel in order to optimize intra-operative equipment management.   Merla Riches PA-C, MPAS [facility] is now [employer] [facility] [address], [place], [place], [place] [address] Phone: [phone] [email or web address] Facebook  [employer]

## 2021-06-12 ENCOUNTER — Encounter (HOSPITAL_COMMUNITY): Payer: PPO

## 2021-06-12 NOTE — Progress Notes (Signed)
DUE TO COVID-19 ONLY ONE VISITOR IS ALLOWED TO COME WITH YOU AND STAY IN THE WAITING ROOM ONLY DURING PRE OP AND PROCEDURE DAY OF SURGERY.  2 VISITOR  MAY VISIT WITH YOU AFTER SURGERY IN YOUR PRIVATE ROOM DURING VISITING HOURS ONLY!  YOU NEED TO HAVE A COVID 19 TEST ON__8/23/2022 ____'@_'$  '@_from'$  8am-3pm _____, THIS TEST MUST BE DONE BEFORE SURGERY,  Covid test is done at [place], [place] [address].  This is a drive thru.  No appt required. Please see map.                 Your procedure is scheduled on:  06/23/2021   Report to [facility] Main  Entrance   Report to admitting at     1000AM     Call this number if you have problems the morning of surgery [phone]    REMEMBER: NO  SOLID FOOD CANDY OR GUM AFTER MIDNIGHT. CLEAR LIQUIDS UNTIL   0930am         . NOTHING BY MOUTH EXCEPT CLEAR LIQUIDS UNTIL    0930am  . PLEASE FINISH ENSURE DRINK PER SURGEON ORDER  WHICH NEEDS TO BE COMPLETED AT  0930am     .      CLEAR LIQUID DIET   Foods Allowed                                                                    Coffee and tea, regular and decaf                            Fruit ices (not with fruit pulp)                                      Iced Popsicles                                    Carbonated beverages, regular and diet                                    Cranberry, grape and apple juices Sports drinks like Gatorade Lightly seasoned clear broth or consume(fat free) Sugar, honey syrup ___________________________________________________________________      BRUSH YOUR TEETH MORNING OF SURGERY AND RINSE YOUR MOUTH OUT, NO CHEWING GUM CANDY OR MINTS.     Take these medicines the morning of surgery with A SIP OF WATER: vurocit k , protonix, amlodipine, coreg, cymbalta, synthroid, cytomel   DO NOT TAKE ANY DIABETIC MEDICATIONS DAY OF YOUR SURGERY                               You may not have any metal on your body including hair pins and               piercings  Do not wear jewelry, make-up, lotions, powders or perfumes, deodorant  Do not wear nail polish on your fingernails.  Do not shave  48 hours prior to surgery.              Men may shave face and neck.   Do not bring valuables to the hospital. [place].  Contacts, dentures or bridgework may not be worn into surgery.  Leave suitcase in the car. After surgery it may be brought to your room.     Patients discharged the day of surgery will not be allowed to drive home. IF YOU ARE HAVING SURGERY AND GOING HOME THE SAME DAY, YOU MUST HAVE AN ADULT TO DRIVE YOU HOME AND BE WITH YOU FOR 24 HOURS. YOU MAY GO HOME BY TAXI OR UBER OR ORTHERWISE, BUT AN ADULT MUST ACCOMPANY YOU HOME AND STAY WITH YOU FOR 24 HOURS.  Name and phone number of your driver:  Special Instructions: N/A              Please read over the following fact sheets you were given: _____________________________________________________________________  [facility] - Preparing for Surgery Before surgery, you can play an important role.  Because skin is not sterile, your skin needs to be as free of germs as possible.  You can reduce the number of germs on your skin by washing with CHG (chlorahexidine gluconate) soap before surgery.  CHG is an antiseptic cleaner which kills germs and bonds with the skin to continue killing germs even after washing. Please DO NOT use if you have an allergy to CHG or antibacterial soaps.  If your skin becomes reddened/irritated stop using the CHG and inform your nurse when you arrive at Short Stay. Do not shave (including legs and underarms) for at least 48 hours prior to the first CHG shower.  You may shave your face/neck. Please follow these instructions carefully:  1.  Shower with CHG Soap the night before surgery and the  morning of Surgery.  2.  If you choose to wash your hair, wash your hair first as usual with your  normal   shampoo.  3.  After you shampoo, rinse your hair and body thoroughly to remove the  shampoo.                           4.  Use CHG as you would any other liquid soap.  You can apply chg directly  to the skin and wash                       Gently with a scrungie or clean washcloth.  5.  Apply the CHG Soap to your body ONLY FROM THE NECK DOWN.   Do not use on face/ open                           Wound or open sores. Avoid contact with eyes, ears mouth and genitals (private parts).                       Wash face,  Genitals (private parts) with your normal soap.             6.  Wash thoroughly, paying special attention to the area where your surgery  will be performed.  7.  Thoroughly rinse your body with  warm water from the neck down.  8.  DO NOT shower/wash with your normal soap after using and rinsing off  the CHG Soap.                9.  Pat yourself dry with a clean towel.            10.  Wear clean pajamas.            11.  Place clean sheets on your bed the night of your first shower and do not  sleep with pets. Day of Surgery : Do not apply any lotions/deodorants the morning of surgery.  Please wear clean clothes to the hospital/surgery center.  FAILURE TO FOLLOW THESE INSTRUCTIONS MAY RESULT IN THE CANCELLATION OF YOUR SURGERY PATIENT SIGNATURE_________________________________  NURSE SIGNATURE__________________________________  ________________________________________________________________________              [facility]- Preparing for Total Shoulder Arthroplasty    Before surgery, you can play an important role. Because skin is not sterile, your skin needs to be as free of germs as possible. You can reduce the number of germs on your skin by using the following products. Benzoyl Peroxide Gel Reduces the number of germs present on the skin Applied twice a day to shoulder area starting two days before surgery     ==================================================================  Please follow these instructions carefully:  BENZOYL PEROXIDE 5% GEL  Please do not use if you have an allergy to benzoyl peroxide.   If your skin becomes reddened/irritated stop using the benzoyl peroxide.  Starting two days before surgery, apply as follows: Apply benzoyl peroxide in the morning and at night. Apply after taking a shower. If you are not taking a shower clean entire shoulder front, back, and side along with the armpit with a clean wet washcloth.  Place a quarter-sized dollop on your shoulder and rub in thoroughly, making sure to cover the front, back, and side of your shoulder, along with the armpit.   2 days before ____ AM   ____ PM              1 day before ____ AM   ____ PM                         Do this twice a day for two days.  (Last application is the night before surgery, AFTER using the CHG soap as described below).  Do NOT apply benzoyl peroxide gel on the day of surgery.

## 2021-06-14 ENCOUNTER — Other Ambulatory Visit: Payer: Self-pay

## 2021-06-14 ENCOUNTER — Encounter (HOSPITAL_COMMUNITY)
Admission: RE | Admit: 2021-06-14 | Discharge: 2021-06-14 | Disposition: A | Discharge status: Home / Self Care | Payer: PPO | Source: Ambulatory Visit | Attending: Orthopedic Surgery | Admitting: Orthopedic Surgery

## 2021-06-14 ENCOUNTER — Encounter (HOSPITAL_COMMUNITY): Payer: Self-pay

## 2021-06-14 DIAGNOSIS — Z7984 Long term (current) use of oral hypoglycemic drugs: Secondary | ICD-10-CM | POA: Insufficient documentation

## 2021-06-14 DIAGNOSIS — I2510 Atherosclerotic heart disease of native coronary artery without angina pectoris: Secondary | ICD-10-CM | POA: Diagnosis not present

## 2021-06-14 DIAGNOSIS — Z951 Presence of aortocoronary bypass graft: Secondary | ICD-10-CM | POA: Diagnosis not present

## 2021-06-14 DIAGNOSIS — Z79899 Other long term (current) drug therapy: Secondary | ICD-10-CM | POA: Diagnosis not present

## 2021-06-14 DIAGNOSIS — E118 Type 2 diabetes mellitus with unspecified complications: Secondary | ICD-10-CM | POA: Insufficient documentation

## 2021-06-14 DIAGNOSIS — Z7982 Long term (current) use of aspirin: Secondary | ICD-10-CM | POA: Diagnosis not present

## 2021-06-14 DIAGNOSIS — Z7901 Long term (current) use of anticoagulants: Secondary | ICD-10-CM | POA: Diagnosis not present

## 2021-06-14 DIAGNOSIS — M12811 Other specific arthropathies, not elsewhere classified, right shoulder: Secondary | ICD-10-CM | POA: Insufficient documentation

## 2021-06-14 DIAGNOSIS — Z01812 Encounter for preprocedural laboratory examination: Secondary | ICD-10-CM | POA: Diagnosis not present

## 2021-06-14 DIAGNOSIS — I10 Essential (primary) hypertension: Secondary | ICD-10-CM | POA: Insufficient documentation

## 2021-06-14 DIAGNOSIS — G4733 Obstructive sleep apnea (adult) (pediatric): Secondary | ICD-10-CM | POA: Diagnosis not present

## 2021-06-14 HISTORY — DX: Anxiety disorder, unspecified: F41.9

## 2021-06-14 HISTORY — DX: Other complications of anesthesia, initial encounter: T88.59XA

## 2021-06-14 HISTORY — DX: Acute myocardial infarction, unspecified: I21.9

## 2021-06-14 HISTORY — DX: Dyspnea, unspecified: R06.00

## 2021-06-14 HISTORY — DX: Personal history of urinary calculi: Z87.442

## 2021-06-14 HISTORY — DX: Unspecified osteoarthritis, unspecified site: M19.90

## 2021-06-14 LAB — CBC
HCT: 37.7 % — ABNORMAL LOW (ref 39.0–52.0)
Hemoglobin: 12.2 g/dL — ABNORMAL LOW (ref 13.0–17.0)
MCH: 27.4 pg (ref 26.0–34.0)
MCHC: 32.4 g/dL (ref 30.0–36.0)
MCV: 84.5 fL (ref 80.0–100.0)
Platelets: 182 10*3/uL (ref 150–400)
RBC: 4.46 MIL/uL (ref 4.22–5.81)
RDW: 15.9 % — ABNORMAL HIGH (ref 11.5–15.5)
WBC: 5.7 10*3/uL (ref 4.0–10.5)
nRBC: 0 % (ref 0.0–0.2)

## 2021-06-14 LAB — BASIC METABOLIC PANEL
Anion gap: 8 (ref 5–15)
BUN: 19 mg/dL (ref 8–23)
CO2: 25 mmol/L (ref 22–32)
Calcium: 9.6 mg/dL (ref 8.9–10.3)
Chloride: 107 mmol/L (ref 98–111)
Creatinine, Ser: 1.1 mg/dL (ref 0.61–1.24)
GFR, Estimated: >60 mL/min (ref >60)
Glucose, Bld: 146 mg/dL — ABNORMAL HIGH (ref 70–99)
Potassium: 4 mmol/L (ref 3.5–5.1)
Sodium: 140 mmol/L (ref 135–145)

## 2021-06-14 LAB — SURGICAL PCR SCREEN
MRSA, PCR: NEGATIVE
Staphylococcus aureus: NEGATIVE

## 2021-06-14 LAB — HEMOGLOBIN A1C
Hgb A1c MFr Bld: 8 % — ABNORMAL HIGH (ref 4.8–5.6)
Mean Plasma Glucose: 182.9 mg/dL

## 2021-06-14 LAB — GLUCOSE, CAPILLARY: Glucose-Capillary: 136 mg/dL — ABNORMAL HIGH (ref 70–99)

## 2021-06-14 NOTE — Progress Notes (Addendum)
Anesthesia Review:  PCP: DR [name] LOV 03/16/2021  Requested LOV note.  LOV 05/29/2021 on chart. OV note of 05/26/21 on chart  Cardiologist : DR Peter Martinique  [place] 05/10/21 with hao Meng,PA for preop exam  Chest x-ray : EKG : 05/10/21  08/03/2020- carotids  Echo : 2019  Stress test: 2019  Cardiac Cath :  Activity level: can do a flight of stairs without difficulty  Sleep Study/ CPAP : has cpap  Fasting Blood Sugar :      / Checks Blood Sugar -- times a day:   Blood Thinner/ Instructions /Last Dose: ASA / Instructions/ Last Dose :   Hx of Chronic bronchitis per pt  DM- type 2- Freestyle Libre- does not currently have on pt plans to put back on per pt  PT reports at preop having covid 03/2021 could not remember date, positive home test done.  PT stated he did contact office of DR [name] and stated he tested positive for covid.  NO treatment was done.  Nurse called office and has requested docoumentation of covid call.  PT aware that nurse will be in contact with him as to whether or not to go for covid test on 06/20/21.   Spoke with office staff and there is no documentation in regards to positive home covid test in 03/2021.  Called pt and LVMM stating he would have to go for covid test on 06/20/21.  Asked for call back.  Hgba1c-06/14/21-8.0- routed to Dr Veverly Fells.  Sharee Pimple, nurse at Dr [name] office called and nurse called her back.  Sharee Pimple stated there is no documentation of pt calling in June, 2022 to report a positive home covid test.  Sharee Pimple states she will fax over [place] note from DR [name] of 04/2021.   Nurse called and spoke with pt and he was made aware that there is no documentation at DR [name] in regards to Covid phone call in 03/2021.  PT is aware to go for covid test on 06/20/21.  Pt voiced understanding.

## 2021-06-15 NOTE — Anesthesia Preprocedure Evaluation (Addendum)
Anesthesia Evaluation  Patient identified by MRN, date of birth, ID band Patient awake    Reviewed: Allergy & Precautions, NPO status , Patient's Chart, lab work & pertinent test results  Airway Mallampati: II  TM Distance: >3 FB Neck ROM: Full    Dental no notable dental hx.    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  Reproductive/Obstetrics negative OB ROS                            Anesthesia Physical Anesthesia Plan  ASA: 3  Anesthesia Plan: General   Post-op Pain Management: GA combined w/ Regional for post-op pain   Induction: Intravenous  PONV Risk Score and Plan: 2 and Treatment may vary due to age or medical condition, Ondansetron and Dexamethasone  Airway Management Planned: Oral ETT  Additional Equipment: None  Intra-op Plan:   Post-operative Plan: Extubation in OR  Informed Consent: I have reviewed the patients History and Physical, chart, labs and discussed the procedure including the risks, benefits and alternatives for the proposed anesthesia with the patient or authorized representative who has indicated his/her understanding and acceptance.     Dental advisory given  Plan Discussed with: CRNA, Anesthesiologist and Surgeon  Anesthesia Plan  Comments: (Interscalene block with exparel. GETA. Smaller ETT due to vocal cord issues. Norton Blizzard, MD  )       Anesthesia Quick Evaluation

## 2021-06-15 NOTE — Progress Notes (Signed)
Anesthesia Chart Review:   Case: [id number] Date/Time: 06/23/21 1220   Procedure: REVERSE SHOULDER ARTHROPLASTY (Right: Shoulder) - with ISB   Anesthesia type: General   Pre-op diagnosis: right shoulder rotator cuff arthropathy   Location: [facility] 06 / [facility]   Surgeons: [name], MD       DISCUSSION: Pt is 75 years old with hx CAD (s/p CABG 2005; s/p stent to SVG to OM2 & occluded SVG-PDA with left to right collaterals 2016), carotid artery disease, HTN, DM, OSA  I left voicemail for [facility] in Dr. Veverly Fells' office with A1c result.   VS: BP 135/84   Pulse 74   Temp 36.8 C (Oral)   Resp 16   Ht '5\' 9"'$  (1.753 m)   Wt 87.1 kg   SpO2 98%   BMI 28.35 kg/m   PROVIDERS: - PCP is [name], MD - Cardiologist is Peter Martinique, MD. Cleared for surgery at last office visit 05/10/21 with Almyra Deforest, PA  LABS: Labs reviewed: Acceptable for surgery. (all labs ordered are listed, but only abnormal results are displayed)  Labs Reviewed  HEMOGLOBIN A1C - Abnormal; Notable for the following components:      Result Value   Hgb A1c MFr Bld 8.0 (*)    All other components within normal limits  BASIC METABOLIC PANEL - Abnormal; Notable for the following components:   Glucose, Bld 146 (*)    All other components within normal limits  CBC - Abnormal; Notable for the following components:   Hemoglobin 12.2 (*)    HCT 37.7 (*)    RDW 15.9 (*)    All other components within normal limits  GLUCOSE, CAPILLARY - Abnormal; Notable for the following components:   Glucose-Capillary 136 (*)    All other components within normal limits  SURGICAL PCR SCREEN    EKG 05/10/21: NSR without significant ST-T wave changes   CV: Carotid duplex 08/03/20:  - Right Carotid: Velocities in the right ICA are consistent with a 1-39% stenosis. Non-hemodynamically significant plaque <50% noted in the CCA. The ECA appears >50% stenosed.  - Left Carotid: Velocities in the left ICA are consistent with a 40-59%  stenosis. Non-hemodynamically significant plaque <50% noted in the CCA.  - Vertebrals:  Left vertebral artery demonstrates antegrade flow. Right vertebral artery demonstrates bidirectional flow.  - Subclavians: Bilateral subclavian arteries were stenotic. Bilateral subclavian artery flow was disturbed  Echo 07/04/18:  - Procedure narrative: Transthoracic echocardiography. Image quality was adequate. Intravenous contrast (Definity) was administered.  - Left ventricle: The cavity size was normal. Wall thickness was increased in a pattern of mild LVH. Systolic function was normal. The estimated ejection fraction was in the range of 55% to 60%. Hypokinesis of the anteroseptal and inferoseptal myocardium consistent with prior CABG. Doppler parameters are consistent with abnormal left ventricular relaxation (grade 1 diastolic dysfunction). Doppler parameters are consistent with indeterminate ventricular filling pressure.  - Aortic valve: Transvalvular velocity was within the normal range. There was no stenosis.  - Mitral valve: Transvalvular velocity was within the normal range. There was no evidence for stenosis. There was no regurgitation.  - Right ventricle: The cavity size was normal. Wall thickness was normal. Systolic function was normal.  - Tricuspid valve: There was trivial regurgitation.  - Pulmonary arteries: Systolic pressure was within the normal    range. PA peak pressure: 31 mm Hg (S).    Cath 06/26/2017 Prox RCA to Dist RCA lesion, 100 %stenosed. Prox LAD to Mid LAD lesion, 70 %  stenosed. Dist LAD lesion, 50 %stenosed. 1st Diag lesion, 100 %stenosed. Ost Ramus lesion, 100 %stenosed. 2nd Mrg lesion, 99 %stenosed. Mid Cx to Dist Cx lesion, 60 %stenosed. SVG to RCA- Origin lesion, 100 %stenosed. SVG sequential to ramus intermediate and OM is widely patent. SVG to diagonal is patent. LIMA and is normal in caliber. LV end diastolic pressure is normal. 1. Severe 3 vessel obstructive  CAD 2. Patent LIMA to the LAD 3. Patent SVG to ramus and OM branches sequentially. Stent is widely patent 4. Patent SVG to diagonal 5. SVG to RCA known to be occluded. 6. Normal LVEDP  Past Medical History:  Diagnosis Date   Allergy    Anxiety    Arthritis    Complication of anesthesia    slow to wake up   Coronary artery disease    a. s/p CABG;  b. 08/2015 MV: inf/inflat infarct w/ new peri-infarct isch; c. LHC 11/16: pLAD 70, dLAD 50, D1 100, oRI 100, mLCx 60, OM2 99, pRCA 99 (L>R collats), S-RPDA 100, S-D1 ok, S-RI/OM2 mid 85 (PCI: Synergy DES), L-LAD ok, EF 55-65%.   Depression    Dyspnea    due to meds pt on per pt   GERD (gastroesophageal reflux disease)    History of kidney stones    Hyperlipidemia    Hypertension    Hypertensive heart disease    Hypothyroidism    Insomnia    Myocardial infarction (HCC)    OSA (obstructive sleep apnea)    CPAP   Prostate cancer ([place])    Type II diabetes mellitus ([place])     Past Surgical History:  Procedure Laterality Date   CARDIAC CATHETERIZATION N/A 09/19/2015   Procedure: Coronary Stent Intervention;  Surgeon: Peter M Martinique, MD;  Location: [place] [facility];  Service: Cardiovascular;  Laterality: N/A;   CARDIAC CATHETERIZATION  09/19/2015   Procedure: Left Heart Cath and Cors/Grafts Angiography;  Surgeon: Peter M Martinique, MD;  Location: [place] [facility];  Service: Cardiovascular;;   CHOLECYSTECTOMY     CORONARY ARTERY BYPASS GRAFT     JOINT REPLACEMENT     right knee   LEFT HEART CATH AND CORS/GRAFTS ANGIOGRAPHY N/A 06/26/2017   Procedure: LEFT HEART CATH AND CORS/GRAFTS ANGIOGRAPHY;  Surgeon: Martinique, Peter M, MD;  Location: [place] [facility];  Service: Cardiovascular;  Laterality: N/A;   left knee replacement      PROSTATE SURGERY     right carpal tunnel       MEDICATIONS:  acetaminophen (TYLENOL) 650 MG CR tablet   amLODipine (NORVASC) 5 MG tablet   aspirin EC 81 MG tablet   carvedilol (COREG) 12.5 MG tablet    Cholecalciferol (VITAMIN D) 2000 units tablet   clindamycin (CLEOCIN) 150 MG capsule   colestipol (COLESTID) 1 G tablet   DULoxetine (CYMBALTA) 60 MG capsule   empagliflozin (JARDIANCE) 10 MG TABS tablet   empagliflozin (JARDIANCE) 25 MG TABS tablet   EPINEPHrine 0.3 mg/0.3 mL IJ SOAJ injection   fluticasone (FLONASE) 50 MCG/ACT nasal spray   glipiZIDE (GLUCOTROL) 5 MG tablet   guaiFENesin (MUCINEX) 600 MG 12 hr tablet   levothyroxine (SYNTHROID) 88 MCG tablet   loperamide (IMODIUM A-D) 2 MG tablet   losartan (COZAAR) 100 MG tablet   metFORMIN (GLUCOPHAGE) 1000 MG tablet   Multiple Vitamin (MULTIVITAMIN) tablet   nitroGLYCERIN (NITROSTAT) 0.4 MG SL tablet   Omega-3 Fatty Acids (FISH OIL) 1000 MG CAPS   pantoprazole (PROTONIX) 20 MG tablet   potassium citrate (  UROCIT-K) 10 MEQ (1080 MG) SR tablet   rosuvastatin (CRESTOR) 10 MG tablet   traZODone (DESYREL) 50 MG tablet   triamcinolone cream (KENALOG) 0.1 %   vitamin B-12 (CYANOCOBALAMIN) 250 MCG tablet   No current facility-administered medications for this encounter.    If no changes, I anticipate pt can proceed with surgery as scheduled.   Willeen Cass, PhD, FNP-BC [facility]/Anesthesiology Phone: [phone] 06/15/2021 11:44 AM

## 2021-06-16 NOTE — Progress Notes (Signed)
PT called [facility] in regards that he had a question in regards to allergies.  Called pt back and reviewed entire list of allergies with reactions with pt .  All are listed in epic.  PT voiced understanding.

## 2021-06-20 ENCOUNTER — Other Ambulatory Visit: Payer: Self-pay | Admitting: Orthopedic Surgery

## 2021-06-20 LAB — SARS CORONAVIRUS 2 (TAT 6-24 HRS): SARS Coronavirus 2: NEGATIVE

## 2021-06-21 DIAGNOSIS — E039 Hypothyroidism, unspecified: Secondary | ICD-10-CM | POA: Diagnosis not present

## 2021-06-21 DIAGNOSIS — K219 Gastro-esophageal reflux disease without esophagitis: Secondary | ICD-10-CM | POA: Diagnosis not present

## 2021-06-21 DIAGNOSIS — I2510 Atherosclerotic heart disease of native coronary artery without angina pectoris: Secondary | ICD-10-CM | POA: Diagnosis not present

## 2021-06-21 DIAGNOSIS — I10 Essential (primary) hypertension: Secondary | ICD-10-CM | POA: Diagnosis not present

## 2021-06-21 DIAGNOSIS — E1142 Type 2 diabetes mellitus with diabetic polyneuropathy: Secondary | ICD-10-CM | POA: Diagnosis not present

## 2021-06-21 DIAGNOSIS — I252 Old myocardial infarction: Secondary | ICD-10-CM | POA: Diagnosis not present

## 2021-06-21 DIAGNOSIS — E1122 Type 2 diabetes mellitus with diabetic chronic kidney disease: Secondary | ICD-10-CM | POA: Diagnosis not present

## 2021-06-21 DIAGNOSIS — F324 Major depressive disorder, single episode, in partial remission: Secondary | ICD-10-CM | POA: Diagnosis not present

## 2021-06-21 DIAGNOSIS — M159 Polyosteoarthritis, unspecified: Secondary | ICD-10-CM | POA: Diagnosis not present

## 2021-06-21 DIAGNOSIS — C61 Malignant neoplasm of prostate: Secondary | ICD-10-CM | POA: Diagnosis not present

## 2021-06-21 DIAGNOSIS — E1165 Type 2 diabetes mellitus with hyperglycemia: Secondary | ICD-10-CM | POA: Diagnosis not present

## 2021-06-21 DIAGNOSIS — E785 Hyperlipidemia, unspecified: Secondary | ICD-10-CM | POA: Diagnosis not present

## 2021-06-21 DIAGNOSIS — N183 Chronic kidney disease, stage 3 unspecified: Secondary | ICD-10-CM | POA: Diagnosis not present

## 2021-06-23 ENCOUNTER — Observation Stay (HOSPITAL_COMMUNITY): Payer: PPO

## 2021-06-23 ENCOUNTER — Encounter (HOSPITAL_COMMUNITY)
Admission: RE | Disposition: A | Discharge status: Home / Self Care | Payer: Self-pay | Source: Home / Self Care | Attending: Orthopedic Surgery

## 2021-06-23 ENCOUNTER — Ambulatory Visit (HOSPITAL_COMMUNITY): Payer: PPO | Admitting: Anesthesiology

## 2021-06-23 ENCOUNTER — Encounter (HOSPITAL_COMMUNITY): Payer: Self-pay | Admitting: Orthopedic Surgery

## 2021-06-23 ENCOUNTER — Ambulatory Visit (HOSPITAL_COMMUNITY): Payer: PPO | Admitting: Emergency Medicine

## 2021-06-23 ENCOUNTER — Observation Stay (HOSPITAL_COMMUNITY)
Admission: RE | Admit: 2021-06-23 | Discharge: 2021-06-24 | Disposition: A | Discharge status: Home / Self Care | Payer: PPO | Attending: Orthopedic Surgery | Admitting: Orthopedic Surgery

## 2021-06-23 ENCOUNTER — Other Ambulatory Visit: Payer: Self-pay

## 2021-06-23 DIAGNOSIS — Z471 Aftercare following joint replacement surgery: Secondary | ICD-10-CM | POA: Diagnosis not present

## 2021-06-23 DIAGNOSIS — Z8546 Personal history of malignant neoplasm of prostate: Secondary | ICD-10-CM | POA: Insufficient documentation

## 2021-06-23 DIAGNOSIS — G8918 Other acute postprocedural pain: Secondary | ICD-10-CM | POA: Diagnosis not present

## 2021-06-23 DIAGNOSIS — Z79899 Other long term (current) drug therapy: Secondary | ICD-10-CM | POA: Insufficient documentation

## 2021-06-23 DIAGNOSIS — I10 Essential (primary) hypertension: Secondary | ICD-10-CM | POA: Diagnosis not present

## 2021-06-23 DIAGNOSIS — Z96611 Presence of right artificial shoulder joint: Secondary | ICD-10-CM | POA: Diagnosis not present

## 2021-06-23 DIAGNOSIS — E119 Type 2 diabetes mellitus without complications: Secondary | ICD-10-CM | POA: Insufficient documentation

## 2021-06-23 DIAGNOSIS — Z7984 Long term (current) use of oral hypoglycemic drugs: Secondary | ICD-10-CM | POA: Diagnosis not present

## 2021-06-23 DIAGNOSIS — M19011 Primary osteoarthritis, right shoulder: Principal | ICD-10-CM | POA: Insufficient documentation

## 2021-06-23 DIAGNOSIS — M75121 Complete rotator cuff tear or rupture of right shoulder, not specified as traumatic: Secondary | ICD-10-CM | POA: Diagnosis not present

## 2021-06-23 DIAGNOSIS — Z951 Presence of aortocoronary bypass graft: Secondary | ICD-10-CM | POA: Insufficient documentation

## 2021-06-23 DIAGNOSIS — Z96651 Presence of right artificial knee joint: Secondary | ICD-10-CM | POA: Insufficient documentation

## 2021-06-23 DIAGNOSIS — M75101 Unspecified rotator cuff tear or rupture of right shoulder, not specified as traumatic: Secondary | ICD-10-CM | POA: Diagnosis not present

## 2021-06-23 DIAGNOSIS — Z7982 Long term (current) use of aspirin: Secondary | ICD-10-CM | POA: Insufficient documentation

## 2021-06-23 DIAGNOSIS — G4733 Obstructive sleep apnea (adult) (pediatric): Secondary | ICD-10-CM | POA: Diagnosis not present

## 2021-06-23 DIAGNOSIS — Z9989 Dependence on other enabling machines and devices: Secondary | ICD-10-CM | POA: Diagnosis not present

## 2021-06-23 DIAGNOSIS — E039 Hypothyroidism, unspecified: Secondary | ICD-10-CM | POA: Diagnosis not present

## 2021-06-23 DIAGNOSIS — M67813 Other specified disorders of tendon, right shoulder: Secondary | ICD-10-CM | POA: Diagnosis not present

## 2021-06-23 DIAGNOSIS — M67812 Other specified disorders of synovium, left shoulder: Secondary | ICD-10-CM | POA: Diagnosis not present

## 2021-06-23 DIAGNOSIS — I2510 Atherosclerotic heart disease of native coronary artery without angina pectoris: Secondary | ICD-10-CM | POA: Insufficient documentation

## 2021-06-23 HISTORY — PX: REVERSE SHOULDER ARTHROPLASTY: SHX5054

## 2021-06-23 LAB — GLUCOSE, CAPILLARY
Glucose-Capillary: 176 mg/dL — ABNORMAL HIGH (ref 70–99)
Glucose-Capillary: 173 mg/dL — ABNORMAL HIGH (ref 70–99)
Glucose-Capillary: 251 mg/dL — ABNORMAL HIGH (ref 70–99)
Glucose-Capillary: 227 mg/dL — ABNORMAL HIGH (ref 70–99)

## 2021-06-23 SURGERY — ARTHROPLASTY, SHOULDER, TOTAL, REVERSE
Anesthesia: General | Site: Shoulder | Laterality: Right

## 2021-06-23 MED ORDER — LEVOTHYROXINE SODIUM 88 MCG PO TABS
88.0000 ug | ORAL_TABLET | Freq: Every morning | ORAL | Status: DC
  Administered 2021-06-24: 88 ug via ORAL
  Filled 2021-06-23: qty 1

## 2021-06-23 MED ORDER — METOCLOPRAMIDE HCL 5 MG/ML IJ SOLN: 5.0000 mg | Freq: Three times a day (TID) | INTRAMUSCULAR | Status: DC | PRN

## 2021-06-23 MED ORDER — ONDANSETRON HCL 4 MG/2ML IJ SOLN
INTRAMUSCULAR | Status: DC | PRN
  Administered 2021-06-23: 4 mg via INTRAVENOUS

## 2021-06-23 MED ORDER — MIDAZOLAM HCL 2 MG/2ML IJ SOLN
1.0000 mg | INTRAMUSCULAR | Status: DC
  Filled 2021-06-23: qty 2

## 2021-06-23 MED ORDER — TRAMADOL HCL 50 MG PO TABS: 50.0000 mg | ORAL_TABLET | Freq: Four times a day (QID) | ORAL | 0 refills | Status: AC | PRN

## 2021-06-23 MED ORDER — BUPIVACAINE HCL (PF) 0.5 % IJ SOLN: INTRAMUSCULAR | Status: DC | PRN

## 2021-06-23 MED ORDER — MORPHINE SULFATE (PF) 2 MG/ML IV SOLN: 0.5000 mg | INTRAVENOUS | Status: DC | PRN

## 2021-06-23 MED ORDER — ONDANSETRON HCL 4 MG/2ML IJ SOLN
INTRAMUSCULAR | Status: AC
  Filled 2021-06-23: qty 2

## 2021-06-23 MED ORDER — EPHEDRINE SULFATE-NACL 50-0.9 MG/10ML-% IV SOSY
PREFILLED_SYRINGE | INTRAVENOUS | Status: DC | PRN
  Administered 2021-06-23 (×2): 10 mg via INTRAVENOUS

## 2021-06-23 MED ORDER — LIDOCAINE 2% (20 MG/ML) 5 ML SYRINGE
INTRAMUSCULAR | Status: AC
  Filled 2021-06-23: qty 5

## 2021-06-23 MED ORDER — DEXAMETHASONE SODIUM PHOSPHATE 10 MG/ML IJ SOLN
INTRAMUSCULAR | Status: DC | PRN
  Administered 2021-06-23: 10 mg via INTRAVENOUS

## 2021-06-23 MED ORDER — ONDANSETRON HCL 4 MG/2ML IJ SOLN: 4.0000 mg | Freq: Four times a day (QID) | INTRAMUSCULAR | Status: DC | PRN

## 2021-06-23 MED ORDER — LOSARTAN POTASSIUM 25 MG PO TABS
25.0000 mg | ORAL_TABLET | Freq: Every day | ORAL | Status: DC
  Administered 2021-06-23 – 2021-06-24 (×2): 25 mg via ORAL
  Filled 2021-06-23 (×2): qty 1

## 2021-06-23 MED ORDER — ACETAMINOPHEN 325 MG PO TABS: 325.0000 mg | ORAL_TABLET | Freq: Four times a day (QID) | ORAL | Status: DC | PRN

## 2021-06-23 MED ORDER — ADULT MULTIVITAMIN W/MINERALS CH
1.0000 | ORAL_TABLET | Freq: Every day | ORAL | Status: DC
  Administered 2021-06-24: 1 via ORAL
  Filled 2021-06-23: qty 1

## 2021-06-23 MED ORDER — ROSUVASTATIN CALCIUM 10 MG PO TABS
10.0000 mg | ORAL_TABLET | Freq: Every day | ORAL | Status: DC
  Administered 2021-06-24: 10 mg via ORAL
  Filled 2021-06-23: qty 1

## 2021-06-23 MED ORDER — POLYETHYLENE GLYCOL 3350 17 G PO PACK: 17.0000 g | PACK | Freq: Every day | ORAL | Status: DC | PRN

## 2021-06-23 MED ORDER — FENTANYL CITRATE (PF) 100 MCG/2ML IJ SOLN
INTRAMUSCULAR | Status: AC
  Filled 2021-06-23: qty 2

## 2021-06-23 MED ORDER — AMISULPRIDE (ANTIEMETIC) 5 MG/2ML IV SOLN: 10.0000 mg | Freq: Once | INTRAVENOUS | Status: DC | PRN

## 2021-06-23 MED ORDER — BUPIVACAINE LIPOSOME 1.3 % IJ SUSP
INTRAMUSCULAR | Status: DC | PRN
  Administered 2021-06-23: 10 mL via PERINEURAL

## 2021-06-23 MED ORDER — PHENYLEPHRINE HCL-NACL 20-0.9 MG/250ML-% IV SOLN
INTRAVENOUS | Status: DC | PRN
  Administered 2021-06-23: 50 ug/min via INTRAVENOUS

## 2021-06-23 MED ORDER — BUPIVACAINE HCL (PF) 0.5 % IJ SOLN
INTRAMUSCULAR | Status: DC | PRN
  Administered 2021-06-23: 15 mL via PERINEURAL

## 2021-06-23 MED ORDER — CARVEDILOL 12.5 MG PO TABS
12.5000 mg | ORAL_TABLET | Freq: Two times a day (BID) | ORAL | Status: DC
  Administered 2021-06-23 – 2021-06-24 (×2): 12.5 mg via ORAL
  Filled 2021-06-23 (×2): qty 1

## 2021-06-23 MED ORDER — ONE-DAILY MULTI VITAMINS PO TABS: 1.0000 | ORAL_TABLET | Freq: Every day | ORAL | Status: DC

## 2021-06-23 MED ORDER — SODIUM CHLORIDE 0.9 % IV SOLN: INTRAVENOUS | Status: DC

## 2021-06-23 MED ORDER — BUPIVACAINE-EPINEPHRINE (PF) 0.25% -1:200000 IJ SOLN
INTRAMUSCULAR | Status: AC
  Filled 2021-06-23: qty 30

## 2021-06-23 MED ORDER — METOCLOPRAMIDE HCL 5 MG PO TABS: 5.0000 mg | ORAL_TABLET | Freq: Three times a day (TID) | ORAL | Status: DC | PRN

## 2021-06-23 MED ORDER — METFORMIN HCL 500 MG PO TABS
1000.0000 mg | ORAL_TABLET | Freq: Two times a day (BID) | ORAL | Status: DC
  Administered 2021-06-23 – 2021-06-24 (×2): 1000 mg via ORAL
  Filled 2021-06-23 (×2): qty 2

## 2021-06-23 MED ORDER — POTASSIUM CITRATE ER 10 MEQ (1080 MG) PO TBCR
10.0000 meq | EXTENDED_RELEASE_TABLET | Freq: Two times a day (BID) | ORAL | Status: DC
  Administered 2021-06-23 – 2021-06-24 (×2): 10 meq via ORAL
  Filled 2021-06-23 (×2): qty 1

## 2021-06-23 MED ORDER — COLESTIPOL HCL 1 G PO TABS
1.0000 g | ORAL_TABLET | Freq: Every day | ORAL | Status: DC
  Administered 2021-06-23: 1 g via ORAL
  Filled 2021-06-23: qty 1

## 2021-06-23 MED ORDER — FENTANYL CITRATE PF 50 MCG/ML IJ SOSY: 25.0000 ug | PREFILLED_SYRINGE | INTRAMUSCULAR | Status: DC | PRN

## 2021-06-23 MED ORDER — EPINEPHRINE 0.3 MG/0.3ML IJ SOAJ
0.3000 mg | Freq: Every day | INTRAMUSCULAR | Status: DC | PRN
  Filled 2021-06-23: qty 0.6

## 2021-06-23 MED ORDER — VITAMIN B-12 250 MCG PO TABS: 250.0000 ug | ORAL_TABLET | Freq: Every day | ORAL | Status: DC

## 2021-06-23 MED ORDER — DULOXETINE HCL 60 MG PO CPEP
60.0000 mg | ORAL_CAPSULE | Freq: Every day | ORAL | Status: DC
  Administered 2021-06-24: 60 mg via ORAL
  Filled 2021-06-23: qty 1

## 2021-06-23 MED ORDER — SODIUM CHLORIDE 0.9 % IR SOLN
Status: DC | PRN
  Administered 2021-06-23: 1000 mL

## 2021-06-23 MED ORDER — DEXAMETHASONE SODIUM PHOSPHATE 10 MG/ML IJ SOLN
INTRAMUSCULAR | Status: AC
  Filled 2021-06-23: qty 1

## 2021-06-23 MED ORDER — TRAZODONE HCL 50 MG PO TABS
50.0000 mg | ORAL_TABLET | Freq: Every day | ORAL | Status: DC
  Administered 2021-06-23: 50 mg via ORAL
  Filled 2021-06-23: qty 1

## 2021-06-23 MED ORDER — GUAIFENESIN ER 600 MG PO TB12: 600.0000 mg | ORAL_TABLET | Freq: Two times a day (BID) | ORAL | Status: DC | PRN

## 2021-06-23 MED ORDER — ASPIRIN EC 81 MG PO TBEC
81.0000 mg | DELAYED_RELEASE_TABLET | Freq: Every day | ORAL | Status: DC
  Administered 2021-06-24: 81 mg via ORAL
  Filled 2021-06-23: qty 1

## 2021-06-23 MED ORDER — PHENOL 1.4 % MT LIQD: 1.0000 | OROMUCOSAL | Status: DC | PRN

## 2021-06-23 MED ORDER — BUPIVACAINE-EPINEPHRINE (PF) 0.25% -1:200000 IJ SOLN
INTRAMUSCULAR | Status: DC | PRN
  Administered 2021-06-23: 13 mL

## 2021-06-23 MED ORDER — GLIPIZIDE 5 MG PO TABS
5.0000 mg | ORAL_TABLET | Freq: Two times a day (BID) | ORAL | Status: DC
  Administered 2021-06-23 – 2021-06-24 (×2): 5 mg via ORAL
  Filled 2021-06-23 (×2): qty 1

## 2021-06-23 MED ORDER — NITROGLYCERIN 0.4 MG SL SUBL: 0.4000 mg | SUBLINGUAL_TABLET | SUBLINGUAL | Status: DC | PRN

## 2021-06-23 MED ORDER — METHOCARBAMOL 1000 MG/10ML IJ SOLN
500.0000 mg | Freq: Four times a day (QID) | INTRAVENOUS | Status: DC | PRN
  Filled 2021-06-23: qty 5

## 2021-06-23 MED ORDER — PROPOFOL 10 MG/ML IV BOLUS
INTRAVENOUS | Status: DC | PRN
  Administered 2021-06-23: 120 mg via INTRAVENOUS

## 2021-06-23 MED ORDER — METHOCARBAMOL 500 MG PO TABS
500.0000 mg | ORAL_TABLET | Freq: Four times a day (QID) | ORAL | Status: DC | PRN
  Administered 2021-06-23 – 2021-06-24 (×2): 500 mg via ORAL
  Filled 2021-06-23 (×2): qty 1

## 2021-06-23 MED ORDER — CLINDAMYCIN HCL 300 MG PO CAPS: 300.0000 mg | ORAL_CAPSULE | ORAL | Status: DC

## 2021-06-23 MED ORDER — AMLODIPINE BESYLATE 5 MG PO TABS
5.0000 mg | ORAL_TABLET | Freq: Two times a day (BID) | ORAL | Status: DC
  Administered 2021-06-23 – 2021-06-24 (×2): 5 mg via ORAL
  Filled 2021-06-23 (×2): qty 1

## 2021-06-23 MED ORDER — FLUTICASONE PROPIONATE 50 MCG/ACT NA SUSP
2.0000 | Freq: Every day | NASAL | Status: DC | PRN
  Filled 2021-06-23: qty 16

## 2021-06-23 MED ORDER — ACETAMINOPHEN ER 650 MG PO TBCR: 1300.0000 mg | EXTENDED_RELEASE_TABLET | Freq: Three times a day (TID) | ORAL | Status: DC | PRN

## 2021-06-23 MED ORDER — ACETAMINOPHEN 500 MG PO TABS
1000.0000 mg | ORAL_TABLET | Freq: Once | ORAL | Status: AC
  Administered 2021-06-23: 1000 mg via ORAL
  Filled 2021-06-23: qty 2

## 2021-06-23 MED ORDER — FENTANYL CITRATE PF 50 MCG/ML IJ SOSY
50.0000 ug | PREFILLED_SYRINGE | INTRAMUSCULAR | Status: DC
  Administered 2021-06-23: 50 ug via INTRAVENOUS
  Filled 2021-06-23: qty 2

## 2021-06-23 MED ORDER — VANCOMYCIN HCL IN DEXTROSE 1-5 GM/200ML-% IV SOLN
INTRAVENOUS | Status: AC
  Filled 2021-06-23: qty 200

## 2021-06-23 MED ORDER — CLINDAMYCIN PHOSPHATE 600 MG/50ML IV SOLN
600.0000 mg | Freq: Four times a day (QID) | INTRAVENOUS | Status: AC
  Administered 2021-06-23 – 2021-06-24 (×3): 600 mg via INTRAVENOUS
  Filled 2021-06-23 (×3): qty 50

## 2021-06-23 MED ORDER — VANCOMYCIN HCL 1000 MG IV SOLR
INTRAVENOUS | Status: DC | PRN
  Administered 2021-06-23: 1000 mg via INTRAVENOUS

## 2021-06-23 MED ORDER — PROPOFOL 10 MG/ML IV BOLUS
INTRAVENOUS | Status: AC
  Filled 2021-06-23: qty 20

## 2021-06-23 MED ORDER — PANTOPRAZOLE SODIUM 20 MG PO TBEC
20.0000 mg | DELAYED_RELEASE_TABLET | Freq: Every day | ORAL | Status: DC
  Administered 2021-06-24: 20 mg via ORAL
  Filled 2021-06-23: qty 1

## 2021-06-23 MED ORDER — VITAMIN D 25 MCG (1000 UNIT) PO TABS
2000.0000 [IU] | ORAL_TABLET | Freq: Every day | ORAL | Status: DC
  Administered 2021-06-24: 2000 [IU] via ORAL
  Filled 2021-06-23: qty 2

## 2021-06-23 MED ORDER — EPHEDRINE 5 MG/ML INJ
INTRAVENOUS | Status: AC
  Filled 2021-06-23: qty 5

## 2021-06-23 MED ORDER — LOPERAMIDE HCL 2 MG PO TABS: 2.0000 mg | ORAL_TABLET | Freq: Four times a day (QID) | ORAL | Status: DC | PRN

## 2021-06-23 MED ORDER — LOPERAMIDE HCL 2 MG PO CAPS
2.0000 mg | ORAL_CAPSULE | Freq: Four times a day (QID) | ORAL | Status: DC | PRN
Start: 2021-06-23 — End: 2021-06-24
  Administered 2021-06-23: 2 mg via ORAL
  Filled 2021-06-23: qty 1

## 2021-06-23 MED ORDER — ROCURONIUM BROMIDE 10 MG/ML (PF) SYRINGE
PREFILLED_SYRINGE | INTRAVENOUS | Status: AC
  Filled 2021-06-23: qty 10

## 2021-06-23 MED ORDER — INSULIN ASPART 100 UNIT/ML IJ SOLN
0.0000 [IU] | Freq: Every day | INTRAMUSCULAR | Status: DC
  Administered 2021-06-23: 2 [IU] via SUBCUTANEOUS

## 2021-06-23 MED ORDER — ACETAMINOPHEN 500 MG PO TABS
1000.0000 mg | ORAL_TABLET | Freq: Three times a day (TID) | ORAL | Status: DC | PRN
  Administered 2021-06-23 – 2021-06-24 (×2): 1000 mg via ORAL
  Filled 2021-06-23 (×2): qty 2

## 2021-06-23 MED ORDER — CYANOCOBALAMIN 500 MCG PO TABS
250.0000 ug | ORAL_TABLET | Freq: Every day | ORAL | Status: DC
  Administered 2021-06-24: 250 ug via ORAL
  Filled 2021-06-23: qty 1

## 2021-06-23 MED ORDER — MENTHOL 3 MG MT LOZG: 1.0000 | LOZENGE | OROMUCOSAL | Status: DC | PRN

## 2021-06-23 MED ORDER — INSULIN ASPART 100 UNIT/ML IJ SOLN
0.0000 [IU] | Freq: Three times a day (TID) | INTRAMUSCULAR | Status: DC
  Administered 2021-06-24: 2 [IU] via SUBCUTANEOUS

## 2021-06-23 MED ORDER — FENTANYL CITRATE (PF) 250 MCG/5ML IJ SOLN
INTRAMUSCULAR | Status: DC | PRN
  Administered 2021-06-23 (×2): 50 ug via INTRAVENOUS

## 2021-06-23 MED ORDER — ORAL CARE MOUTH RINSE: 15.0000 mL | Freq: Once | OROMUCOSAL | Status: AC

## 2021-06-23 MED ORDER — CLINDAMYCIN PHOSPHATE 900 MG/50ML IV SOLN
900.0000 mg | INTRAVENOUS | Status: AC
  Administered 2021-06-23: 900 mg via INTRAVENOUS
  Filled 2021-06-23: qty 50

## 2021-06-23 MED ORDER — METFORMIN HCL 500 MG PO TABS: 1000.0000 mg | ORAL_TABLET | Freq: Two times a day (BID) | ORAL | Status: DC

## 2021-06-23 MED ORDER — OMEGA-3-ACID ETHYL ESTERS 1 G PO CAPS
1.0000 g | ORAL_CAPSULE | Freq: Two times a day (BID) | ORAL | Status: DC
  Administered 2021-06-23 – 2021-06-24 (×2): 1 g via ORAL
  Filled 2021-06-23: qty 1

## 2021-06-23 MED ORDER — DOCUSATE SODIUM 100 MG PO CAPS
100.0000 mg | ORAL_CAPSULE | Freq: Two times a day (BID) | ORAL | Status: DC
  Administered 2021-06-24: 100 mg via ORAL
  Filled 2021-06-23 (×2): qty 1

## 2021-06-23 MED ORDER — CHLORHEXIDINE GLUCONATE 0.12 % MT SOLN
15.0000 mL | Freq: Once | OROMUCOSAL | Status: AC
  Administered 2021-06-23: 15 mL via OROMUCOSAL

## 2021-06-23 MED ORDER — LIDOCAINE 2% (20 MG/ML) 5 ML SYRINGE
INTRAMUSCULAR | Status: DC | PRN
  Administered 2021-06-23: 80 mg via INTRAVENOUS

## 2021-06-23 MED ORDER — LOSARTAN POTASSIUM 25 MG PO TABS: 25.0000 mg | ORAL_TABLET | Freq: Every day | ORAL | Status: DC

## 2021-06-23 MED ORDER — LACTATED RINGERS IV SOLN: INTRAVENOUS | Status: DC

## 2021-06-23 MED ORDER — ONDANSETRON HCL 4 MG PO TABS: 4.0000 mg | ORAL_TABLET | Freq: Four times a day (QID) | ORAL | Status: DC | PRN

## 2021-06-23 MED ORDER — VITAMIN D 50 MCG (2000 UT) PO TABS: 2000.0000 [IU] | ORAL_TABLET | Freq: Every day | ORAL | Status: DC

## 2021-06-23 MED ORDER — INSULIN ASPART 100 UNIT/ML IJ SOLN
4.0000 [IU] | Freq: Three times a day (TID) | INTRAMUSCULAR | Status: DC
  Administered 2021-06-23 – 2021-06-24 (×2): 4 [IU] via SUBCUTANEOUS

## 2021-06-23 MED ORDER — SUCCINYLCHOLINE CHLORIDE 200 MG/10ML IV SOSY
PREFILLED_SYRINGE | INTRAVENOUS | Status: DC | PRN
  Administered 2021-06-23: 160 mg via INTRAVENOUS

## 2021-06-23 SURGICAL SUPPLY — 69 items
BAG COUNTER SPONGE SURGICOUNT (BAG) IMPLANT
BAG ZIPLOCK 12X15 (MISCELLANEOUS) IMPLANT
BIT DRILL 1.6MX128 (BIT) IMPLANT
BIT DRILL 170X2.5X (BIT) ×1 IMPLANT
BIT DRL 170X2.5X (BIT) ×1
BLADE SAG 18X100X1.27 (BLADE) ×2 IMPLANT
COVER BACK TABLE 60X90IN (DRAPES) ×2 IMPLANT
COVER SURGICAL LIGHT HANDLE (MISCELLANEOUS) ×2 IMPLANT
CUP HUMERAL 42 PLUS 3 (Orthopedic Implant) ×2 IMPLANT
DECANTER SPIKE VIAL GLASS SM (MISCELLANEOUS) ×2 IMPLANT
DRAPE INCISE IOBAN 66X45 STRL (DRAPES) ×2 IMPLANT
DRAPE ORTHO SPLIT 77X108 STRL (DRAPES) ×4
DRAPE SHEET LG 3/4 BI-LAMINATE (DRAPES) ×2 IMPLANT
DRAPE SURG ORHT 6 SPLT 77X108 (DRAPES) ×2 IMPLANT
DRAPE TOP 10253 STERILE (DRAPES) ×2 IMPLANT
DRAPE U-SHAPE 47X51 STRL (DRAPES) ×2 IMPLANT
DRILL 2.5 (BIT) ×2
DRSG ADAPTIC 3X8 NADH LF (GAUZE/BANDAGES/DRESSINGS) ×2 IMPLANT
DRSG PAD ABDOMINAL 8X10 ST (GAUZE/BANDAGES/DRESSINGS) ×2 IMPLANT
DURAPREP 26ML APPLICATOR (WOUND CARE) ×2 IMPLANT
ELECT BLADE TIP CTD 4 INCH (ELECTRODE) ×2 IMPLANT
ELECT NEEDLE TIP 2.8 STRL (NEEDLE) ×2 IMPLANT
ELECT REM PT RETURN 15FT ADLT (MISCELLANEOUS) ×2 IMPLANT
EPIPHYSI RIGHT SZ 2 (Shoulder) ×2 IMPLANT
EPIPHYSIS RIGHT SZ 2 (Shoulder) ×1 IMPLANT
FACESHIELD WRAPAROUND (MASK) ×2 IMPLANT
GAUZE SPONGE 4X4 12PLY STRL (GAUZE/BANDAGES/DRESSINGS) ×2 IMPLANT
GLENOSPHERE XTEND LAT 42+0 STD (Miscellaneous) ×2 IMPLANT
GLOVE SURG ORTHO LTX SZ7.5 (GLOVE) ×2 IMPLANT
GLOVE SURG ORTHO LTX SZ8.5 (GLOVE) ×2 IMPLANT
GLOVE SURG UNDER POLY LF SZ7.5 (GLOVE) ×2 IMPLANT
GLOVE SURG UNDER POLY LF SZ8.5 (GLOVE) ×2 IMPLANT
GOWN STRL REUS W/TWL XL LVL3 (GOWN DISPOSABLE) ×4 IMPLANT
KIT BASIN OR (CUSTOM PROCEDURE TRAY) ×2 IMPLANT
KIT TURNOVER KIT A (KITS) ×2 IMPLANT
MANIFOLD NEPTUNE II (INSTRUMENTS) ×2 IMPLANT
METAGLENE DELTA EXTEND (Trauma) ×1 IMPLANT
METAGLENE DXTEND (Trauma) ×2 IMPLANT
NEEDLE MAYO CATGUT SZ4 (NEEDLE) IMPLANT
NS IRRIG 1000ML POUR BTL (IV SOLUTION) ×2 IMPLANT
PACK SHOULDER (CUSTOM PROCEDURE TRAY) ×2 IMPLANT
PIN GUIDE 1.2 (PIN) ×2 IMPLANT
PIN GUIDE GLENOPHERE 1.5MX300M (PIN) ×2 IMPLANT
PIN METAGLENE 2.5 (PIN) ×2 IMPLANT
PROTECTOR NERVE ULNAR (MISCELLANEOUS) ×2 IMPLANT
RESTRAINT HEAD UNIVERSAL NS (MISCELLANEOUS) ×2 IMPLANT
SCREW 4.5X18MM (Screw) ×2 IMPLANT
SCREW BN 18X4.5XSTRL SHLDR (Screw) ×1 IMPLANT
SCREW LOCK 42 (Screw) ×2 IMPLANT
SCREW LOCK DELTA XTEND 4.5X30 (Screw) ×2 IMPLANT
SLING ARM FOAM STRAP LRG (SOFTGOODS) IMPLANT
SLING ARM FOAM STRAP XLG (SOFTGOODS) ×2 IMPLANT
SMARTMIX MINI TOWER (MISCELLANEOUS)
SPONGE T-LAP 4X18 ~~LOC~~+RFID (SPONGE) IMPLANT
STEM 12 HA (Stem) ×2 IMPLANT
STRIP CLOSURE SKIN 1/2X4 (GAUZE/BANDAGES/DRESSINGS) ×2 IMPLANT
SUCTION FRAZIER HANDLE 10FR (MISCELLANEOUS) ×2
SUCTION TUBE FRAZIER 10FR DISP (MISCELLANEOUS) ×1 IMPLANT
SUT FIBERWIRE #2 38 T-5 BLUE (SUTURE) ×4
SUT MNCRL AB 4-0 PS2 18 (SUTURE) ×2 IMPLANT
SUT VIC AB 0 CT1 36 (SUTURE) ×4 IMPLANT
SUT VIC AB 0 CT2 27 (SUTURE) ×2 IMPLANT
SUT VIC AB 2-0 CT1 27 (SUTURE) ×2
SUT VIC AB 2-0 CT1 TAPERPNT 27 (SUTURE) ×1 IMPLANT
SUTURE FIBERWR #2 38 T-5 BLUE (SUTURE) ×2 IMPLANT
TAPE CLOTH SURG 4X10 WHT LF (GAUZE/BANDAGES/DRESSINGS) ×2 IMPLANT
TAPE STRIPS DRAPE STRL (GAUZE/BANDAGES/DRESSINGS) ×2 IMPLANT
TOWEL OR 17X26 10 PK STRL BLUE (TOWEL DISPOSABLE) ×2 IMPLANT
TOWER SMARTMIX MINI (MISCELLANEOUS) IMPLANT

## 2021-06-23 NOTE — Brief Op Note (Signed)
06/23/2021  2:28 PM  PATIENT:  Paul [name]  75 y.o. male  PRE-OPERATIVE DIAGNOSIS:  right shoulder rotator cuff arthropathy  POST-OPERATIVE DIAGNOSIS:  right shoulder rotator cuff arthropathy  PROCEDURE:  Procedure(s) with comments: REVERSE SHOULDER ARTHROPLASTY (Right) - with ISB DePuy Delta Xtend, No subscap repair  SURGEON:  Surgeon(s) and Role:    [name], MD - Primary  PHYSICIAN ASSISTANT:   ASSISTANTS: [place] Bruns, PA-C   ANESTHESIA:   regional and general  EBL:  100 mL   BLOOD ADMINISTERED:none  DRAINS: none   LOCAL MEDICATIONS USED:  MARCAINE     SPECIMEN:  No Specimen  DISPOSITION OF SPECIMEN:  N/A  COUNTS:  YES  TOURNIQUET:  * No tourniquets in log *  DICTATION: .Other Dictation: Dictation Number [id number]  PLAN OF CARE: Admit for overnight observation  PATIENT DISPOSITION:  PACU - hemodynamically stable.   Delay start of Pharmacological VTE agent (>24hrs) due to surgical blood loss or risk of bleeding: not applicable

## 2021-06-23 NOTE — Progress Notes (Signed)
Time out completed. AssistedDr. Elgie Congo with right, ultrasound guided, interscalene  block. Side rails up, monitors on throughout procedure. See vital signs in flow sheet. Tolerated Procedure well.

## 2021-06-23 NOTE — Transfer of Care (Signed)
Immediate Anesthesia Transfer of Care Note  Patient: Paul [name] [facility]  Procedure(s) Performed: REVERSE SHOULDER ARTHROPLASTY (Right: Shoulder)  Patient Location: PACU  Anesthesia Type:General  Level of Consciousness: awake, alert  and oriented  Airway & Oxygen Therapy: Patient Spontanous Breathing and Patient connected to face mask oxygen  Post-op Assessment: Report given to RN and Post -op Vital signs reviewed and stable  Post vital signs: Reviewed and stable  Last Vitals:  Vitals Value Taken Time  BP 128/70 06/23/21 1430  Temp    Pulse 76 06/23/21 1431  Resp 19 06/23/21 1431  SpO2 92 % 06/23/21 1431  Vitals shown include unvalidated device data.  Last Pain:  Vitals:   06/23/21 1130  TempSrc:   PainSc: Asleep         Complications: No notable events documented.

## 2021-06-23 NOTE — Progress Notes (Signed)
Pt said he doesn't want to wear cpap tonight. RN aware. Told pt to let RN know if he changes his mind. RT will continue to monitor.

## 2021-06-23 NOTE — Anesthesia Procedure Notes (Signed)
Anesthesia Regional Block: Interscalene brachial plexus block   Pre-Anesthetic Checklist: , timeout performed,  Correct Patient, Correct Site, Correct Laterality,  Correct Procedure, Correct Position, site marked,  Risks and benefits discussed,  Surgical consent,  Pre-op evaluation,  At surgeon's request and post-op pain management  Laterality: Right  Prep: chloraprep       Needles:  Injection technique: Single-shot  Needle Type: Echogenic Stimulator Needle     Needle Length: 10cm  Needle Gauge: 20     Additional Needles:   Procedures:,,,, ultrasound used (permanent image in chart),,    Narrative:  Start time: 06/23/2021 11:05 AM End time: 06/23/2021 11:10 AM Injection made incrementally with aspirations every 5 mL.  Performed by: Personally  Anesthesiologist: Merlinda Frederick, MD

## 2021-06-23 NOTE — Discharge Instructions (Signed)
Ice to the shoulder constantly.  Keep the incision covered and clean and dry for one week, then ok to get it wet in the shower. ° °Do exercise as instructed several times per day. ° °DO NOT reach behind your back or push up out of a chair with the operative arm. ° °Use a sling while you are up and around for comfort, may remove while seated.  Keep pillow propped behind the operative elbow. ° °Follow up with Dr Wallie Lagrand in two weeks in the office, call [phone] for appt °

## 2021-06-23 NOTE — Op Note (Signed)
[name], [name] MEDICAL RECORD NO: [id number] ACCOUNT NO: [id number] DATE OF BIRTH: 02-Oct-1946 FACILITY: [facility] PHYSICIAN: Paul Heater. Veverly Fells, MD  Operative Report   DATE OF PROCEDURE: 06/23/2021  PREOPERATIVE DIAGNOSES:  Right shoulder rotator cuff tear arthropathy.  POSTOPERATIVE DIAGNOSES:  Right shoulder rotator cuff tear arthropathy.  PROCEDURE PERFORMED:  Right reverse shoulder replacement using DePuy Delta Xtend prosthesis with no subscapularis repair.  ATTENDING SURGEON:  Paul Heater. Veverly Fells, MD.  ASSISTANT:  Charletta Cousin Dixon, [place], who was scrubbed during the entire procedure, and necessary for satisfactory completion of surgery.  ANESTHESIA:  General anesthesia was used plus interscalene block.  ESTIMATED BLOOD LOSS:  Less than 100 mL  FLUID REPLACEMENT:  1500 mL crystalloid.  Instrument count was correct.  There were no complications.  Perioperative antibiotics were given.  INDICATIONS:  The patient is a 75 year old male with a history of worsening right shoulder pain and dysfunction secondary to multiple tendon rotator cuff tear and rotator cuff tear arthropathy.  The patient has both arthritis and three tendon rotator  cuff tear.  The patient has retraction of his rotator cuff.  After consultation with the patient, we decided on a reverse shoulder replacement to restore fixed focal mechanics to his shoulder and eliminate pain and improve range of motion and function.   Informed consent obtained.  DESCRIPTION OF PROCEDURE:  After an adequate level of anesthesia was achieved, the patient was positioned in the modified beach chair position.  Right shoulder correctly identified and sterilely prepped and draped in the usual manner.  Time-out called,  verifying correct patient, correct site.  We entered the patient's shoulder using a standard deltopectoral incision starting at the coracoid process extending down to the anterior humerus.  Dissection down  through subcutaneous tissues using Bovie.  We  identified the cephalic vein and took that out laterally to the deltoid.  Pectoralis was taken medially.  Conjoined tendon identified and retracted medially.  Deep retractors were placed.  Biceps tenodesed in situ with 0 Vicryl figure-of-eight suture x2.   We then released the biceps tendon.  The subscap was completely retracted, not really visible.  The supraspinatus and infraspinatus were also torn and retracted to the glenoid.  Teres minor was intact.  We extended the shoulder.  We did release the  capsule off the inferior neck of the humerus with externally rotating as we did so.  Next, we extended the shoulder and the humeral head was delivered out of the wound.  We then entered the proximal humerus with a 6 mm reamer.  There was completely  eburnated bone superiorly.  We then reamed up to a size 12.  We placed our 12 TM guide and resected the humeral head to 10 degrees of retroversion with the oscillating saw.  We removed excess osteophytes, which were very small with a rongeur.  We then  went ahead and subluxed the humerus posteriorly, gaining good exposure of the glenoid phase.  We remove the capsule and the labrum, the biceps stump.  We then found the center point of the bottom part of the glenoid and placed our guide pin.  We then  reamed for the metaglene baseplate, did our peripheral hand reaming and drilled our central peg hole.  We impacted the metaglene baseplate into position, referencing off the scapular neck.  We then placed a 42 screw inferiorly, a 30 screw superiorly and  an 18 screw anteriorly.  We had great baseplate security and then secured a 42 standard glenosphere  onto the baseplate.  Once that was on, we did a finger sweep make sure that there was no soft tissue caught up in that bearing and no bone posteriorly.   We then directed our attention back towards the humeral side.  We went ahead and reamed for the two right metaphysis set on  the 0 setting and we placed our trial components in place, which was a 12 body 2 right metaphysis set on the 0 setting and  impacted in 10 degrees of retroversion.  We reduced the shoulder with a 48+3 trial poly.  We were happy with our soft tissue balancing and stability throughout a full arc of motion, no gapping on the inferior pole or external rotation and excellent cross  body rotation.  We removed all trial components.  We irrigated thoroughly and then used a press-fit technique with the HA 12 stem and the HA 2 right metaphysis set on the 0 setting and impacted with available bone graft from the humeral head with  impaction grafting technique and 10 degrees of retroversion.  We had a nice stable stem.  We selected the real 42+3 poly impacted down on the humeral tray and reduced the shoulder.  Again a very stable shoulder throughout a full arc of motion and  appropriate tension on the conjoined.  We irrigated thoroughly.  We then repaired the deltopectoral interval with 0 Vicryl suture followed by 2-0 Vicryl for subcutaneous closure and 4-0 Monocryl for skin.  Steri-Strips were applied followed by sterile  dressing.  The patient tolerated the surgery well.   PUS D: 06/23/2021 2:34:12 pm T: 06/23/2021 7:34:00 pm  JOB: [id number]

## 2021-06-23 NOTE — Interval H&P Note (Signed)
History and Physical Interval Note:  06/23/2021 12:12 PM  Paul [name] [facility]  has presented today for surgery, with the diagnosis of right shoulder rotator cuff arthropathy.  The various methods of treatment have been discussed with the patient and family. After consideration of risks, benefits and other options for treatment, the patient has consented to  Procedure(s) with comments: REVERSE SHOULDER ARTHROPLASTY (Right) - with ISB as a surgical intervention.  The patient's history has been reviewed, patient examined, no change in status, stable for surgery.  I have reviewed the patient's chart and labs.  Questions were answered to the patient's satisfaction.     Augustin Schooling

## 2021-06-23 NOTE — Anesthesia Procedure Notes (Signed)
Procedure Name: Intubation Date/Time: 06/23/2021 12:58 PM Performed by: Talbot Grumbling, CRNA Pre-anesthesia Checklist: Patient identified, Emergency Drugs available, Patient being monitored and Suction available Patient Re-evaluated:Patient Re-evaluated prior to induction Preoxygenation: Pre-oxygenation with 100% oxygen Induction Type: IV induction Ventilation: Mask ventilation without difficulty Laryngoscope Size: Mac and 4 Grade View: Grade I Tube type: Oral Tube size: 7.0 mm Number of attempts: 1 Airway Equipment and Method: Stylet Placement Confirmation: ETT inserted through vocal cords under direct vision, positive ETCO2 and breath sounds checked- equal and bilateral Secured at: 22 cm Tube secured with: Tape Dental Injury: Teeth and Oropharynx as per pre-operative assessment

## 2021-06-24 DIAGNOSIS — M19011 Primary osteoarthritis, right shoulder: Secondary | ICD-10-CM | POA: Diagnosis not present

## 2021-06-24 LAB — HEMOGLOBIN AND HEMATOCRIT, BLOOD
HCT: 30.6 % — ABNORMAL LOW (ref 39.0–52.0)
Hemoglobin: 9.9 g/dL — ABNORMAL LOW (ref 13.0–17.0)

## 2021-06-24 LAB — BASIC METABOLIC PANEL
Anion gap: 9 (ref 5–15)
BUN: 32 mg/dL — ABNORMAL HIGH (ref 8–23)
CO2: 25 mmol/L (ref 22–32)
Calcium: 8.9 mg/dL (ref 8.9–10.3)
Chloride: 104 mmol/L (ref 98–111)
Creatinine, Ser: 1.31 mg/dL — ABNORMAL HIGH (ref 0.61–1.24)
GFR, Estimated: 57 mL/min — ABNORMAL LOW (ref >60)
Glucose, Bld: 160 mg/dL — ABNORMAL HIGH (ref 70–99)
Potassium: 5 mmol/L (ref 3.5–5.1)
Sodium: 138 mmol/L (ref 135–145)

## 2021-06-24 LAB — GLUCOSE, CAPILLARY: Glucose-Capillary: 178 mg/dL — ABNORMAL HIGH (ref 70–99)

## 2021-06-24 NOTE — Progress Notes (Signed)
Orthopedics Progress Note  Subjective: Patient reports minimal pain this morning. Block is still working mostly.   Objective:  Vitals:   06/24/21 0152 06/24/21 0535  BP: (!) 108/56 112/65  Pulse: 78 65  Resp: 16 16  Temp: 98.4 F (36.9 C) 98.2 F (36.8 C)  SpO2: 96% 96%    General: Awake and alert  Musculoskeletal: Right shoulder dressing changed. Wound looks good. Some bruising and swelling.  Neurovascularly intact  Lab Results  Component Value Date   WBC 5.7 06/14/2021   HGB 9.9 (L) 06/24/2021   HCT 30.6 (L) 06/24/2021   MCV 84.5 06/14/2021   PLT 182 06/14/2021       Component Value Date/Time   NA 138 06/24/2021 0354   NA 139 06/24/2018 0848   K 5.0 06/24/2021 0354   CL 104 06/24/2021 0354   CO2 25 06/24/2021 0354   GLUCOSE 160 (H) 06/24/2021 0354   BUN 32 (H) 06/24/2021 0354   BUN 23 06/24/2018 0848   CREATININE 1.31 (H) 06/24/2021 0354   CREATININE 1.21 (H) 07/20/2016 0949   CALCIUM 8.9 06/24/2021 0354   GFRNONAA 57 (L) 06/24/2021 0354   GFRAA 55 (L) 06/24/2018 0848    Lab Results  Component Value Date   INR 1.0 06/21/2017   INR 0.99 09/15/2015    Assessment/Plan: POD #1 s/p Procedure(s): REVERSE SHOULDER ARTHROPLASTY Stable this morning. Home after therapy Follow up in two weeks  Doran Heater. Veverly Fells, MD 06/24/2021 9:11 AM

## 2021-06-24 NOTE — Plan of Care (Signed)
  Problem: Clinical Measurements: Goal: Respiratory complications will improve Outcome: Progressing   Problem: Clinical Measurements: Goal: Cardiovascular complication will be avoided Outcome: Progressing   Problem: Pain Managment: Goal: General experience of comfort will improve Outcome: Progressing   

## 2021-06-24 NOTE — Plan of Care (Signed)

## 2021-06-24 NOTE — Plan of Care (Signed)
Pt stable at this time with no needs. Pt to d/c home when RN is ready. Pt dressing clean, dry, and intact.

## 2021-06-24 NOTE — Discharge Summary (Signed)
In most cases prophylactic antibiotics for Dental procdeures after total joint surgery are not necessary.  Exceptions are as follows:  1. History of prior total joint infection  2. Severely immunocompromised (Organ Transplant, cancer chemotherapy, Rheumatoid biologic meds such as [place])  3. Poorly controlled diabetes (A1C &gt; 8.0, blood glucose over 200)  If you have one of these conditions, contact your surgeon for an antibiotic prescription, prior to your dental procedure. Orthopedic Discharge Summary        Physician Discharge Summary  Patient ID: Paul [name] MRN: [id number] DOB/AGE: Mar 09, 1946 75 y.o.  Admit date: 06/23/2021 Discharge date: 06/24/2021   Procedures:  Procedure(s) (LRB): REVERSE SHOULDER ARTHROPLASTY (Right)  Attending Physician:  Dr. 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 s/p CABG;  b. 08/2015 MV: inf/inflat infarct w/ new peri-infarct isch; c. LHC 11/16: pLAD 70, dLAD 50, D1 100, oRI 100, mLCx 60, OM2 99, pRCA 99 (L>R collats), S-RPDA 100, S-D1 ok, S-RI/OM2 mid 85 (PCI: Synergy DES), L-LAD ok, EF 55-65%.   Depression    Dyspnea    due to meds pt on per pt   GERD (gastroesophageal reflux disease)    History of kidney stones    Hyperlipidemia    Hypertension    Hypertensive heart disease    Hypothyroidism    Insomnia    Myocardial infarction (HCC)    OSA (obstructive sleep apnea)    CPAP   Prostate cancer ([place])    Type II diabetes mellitus ([place])     PCP: [name], MD   Discharged Condition: good  Hospital Course:  Patient underwent the above stated procedure on 06/23/2021. Patient tolerated the procedure well and brought to the recovery room in good condition and subsequently to the  floor. Patient had an uncomplicated hospital course and was stable for discharge.   Disposition: Discharge disposition: 01-Home or Self Care      with follow up in 2 weeks    Follow-up Information     [name], MD Follow up.   Specialty: Orthopedic Surgery Contact information: [address] [place] [address] [id number]                 Dental Antibiotics:  In most cases prophylactic antibiotics for Dental procdeures after total joint surgery are not necessary.  Exceptions are as follows:  1. History of prior total joint infection  2. Severely immunocompromised (Organ Transplant, cancer chemotherapy, Rheumatoid biologic meds such as [place])  3. Poorly controlled diabetes (A1C &gt; 8.0, blood glucose over 200)  If you have one of these conditions, contact your surgeon for an antibiotic prescription, prior to your dental procedure.  Discharge Instructions     Call MD / Call 911   Complete by: As directed    If you experience chest pain or shortness of breath, CALL 911 and be transported to the hospital emergency room.  If you develope a fever above 101 F, pus (white drainage) or increased drainage or redness at the wound, or calf pain, call your surgeon's office.   Constipation Prevention   Complete by: As directed    Drink plenty of fluids.  Prune juice may be  helpful.  You may use a stool softener, such as Colace (over the counter) 100 mg twice a day.  Use MiraLax (over the counter) for constipation as needed.   Diet - low sodium heart healthy   Complete by: As directed    Increase activity slowly as tolerated   Complete by: As directed    Post-operative opioid taper instructions:   Complete by: As directed    POST-OPERATIVE OPIOID TAPER INSTRUCTIONS: It is important to wean off of your opioid medication as soon as possible. If you do not need pain medication after your surgery it is ok to stop day one. Opioids include: Codeine,  Hydrocodone(Norco, Vicodin), Oxycodone(Percocet, oxycontin) and hydromorphone amongst others.  Long term and even short term use of opiods can cause: Increased pain response Dependence Constipation Depression Respiratory depression And more.  Withdrawal symptoms can include Flu like symptoms Nausea, vomiting And more Techniques to manage these symptoms Hydrate well Eat regular healthy meals Stay active Use relaxation techniques(deep breathing, meditating, yoga) Do Not substitute Alcohol to help with tapering If you have been on opioids for less than two weeks and do not have pain than it is ok to stop all together.  Plan to wean off of opioids This plan should start within one week post op of your joint replacement. Maintain the same interval or time between taking each dose and first decrease the dose.  Cut the total daily intake of opioids by one tablet each day Next start to increase the time between doses. The last dose that should be eliminated is the evening dose.          Allergies as of 06/24/2021       Reactions   Brilinta [ticagrelor] Shortness Of Breath   Yellow Jacket Venom Anaphylaxis   Morphine Other (See Comments)   Hallucinations/nightmares when taking for greater than 24hrs.   Penicillins Other (See Comments)   "Eyes swell shut" Has patient had a PCN reaction causing immediate rash, facial/tongue/throat swelling, SOB or lightheadedness with hypotension:Yes Has patient had a PCN reaction causing severe rash involving mucus membranes or skin necrosis:No Has patient had a PCN reaction that required hospitalization: No Has patient had a PCN reaction occurring within the last 10 years: No If all of the above answers are "NO", then may proceed with Cephalosporin use.   Percocet [oxycodone-acetaminophen]    Nightmares/hallucinations   Vicodin [hydrocodone-acetaminophen] Other (See Comments)   Hallucinations/nightmares   Alprazolam Rash   Septra Ds  [sulfamethoxazole-trimethoprim] Rash        Medication List     TAKE these medications    acetaminophen 650 MG CR tablet Commonly known as: TYLENOL Take 1,300 mg by mouth every 8 (eight) hours as needed for pain.   amLODipine 5 MG tablet Commonly known as: NORVASC Take 1 tablet (5 mg total) by mouth 2 (two) times daily.   aspirin EC 81 MG tablet Take 81 mg by mouth daily.   carvedilol 12.5 MG tablet Commonly known as: COREG Take 1 tablet by mouth twice daily   clindamycin 150 MG capsule Commonly known as: CLEOCIN Take 300 mg by mouth See admin instructions. Take 300 mg 1 hour before dental procedure.   colestipol 1 g tablet Commonly known as: COLESTID Take 1 g by mouth at bedtime.   DULoxetine 60 MG capsule Commonly known as: CYMBALTA Take 60 mg by mouth daily.   empagliflozin 25 MG Tabs tablet Commonly known as: JARDIANCE Take by mouth daily. What changed: Another medication with  the same name was removed. Continue taking this medication, and follow the directions you see here.   EPINEPHrine 0.3 mg/0.3 mL Soaj injection Commonly known as: EPI-PEN Inject 0.3 mg into the muscle daily as needed for anaphylaxis.   Fish Oil 1000 MG Caps Take 1,000 mg by mouth in the morning and at bedtime.   fluticasone 50 MCG/ACT nasal spray Commonly known as: FLONASE Place 2 sprays into both nostrils daily as needed for allergies or rhinitis.   glipiZIDE 5 MG tablet Commonly known as: GLUCOTROL Take 5 mg by mouth 2 (two) times daily.   guaiFENesin 600 MG 12 hr tablet Commonly known as: MUCINEX Take 600-1,200 mg by mouth 2 (two) times daily as needed (for sinus drainage/cough/congestion).   levothyroxine 88 MCG tablet Commonly known as: SYNTHROID Take 88 mcg by mouth every morning.   loperamide 2 MG tablet Commonly known as: IMODIUM A-D Take 2-4 mg by mouth 4 (four) times daily as needed for diarrhea or loose stools.   losartan 100 MG tablet Commonly known as:  COZAAR TAKE 1 TABLET BY MOUTH ONCE DAILY.   metFORMIN 1000 MG tablet Commonly known as: GLUCOPHAGE Take 1 tablet (1,000 mg total) by mouth 2 (two) times daily.   multivitamin tablet Take 1 tablet by mouth daily.   nitroGLYCERIN 0.4 MG SL tablet Commonly known as: NITROSTAT Place 1 tablet (0.4 mg total) under the tongue every 5 (five) minutes as needed for chest pain.   pantoprazole 20 MG tablet Commonly known as: Protonix Take 1 tablet (20 mg total) by mouth daily.   potassium citrate 10 MEQ (1080 MG) SR tablet Commonly known as: UROCIT-K Take 10 mEq by mouth 2 (two) times daily.   rosuvastatin 10 MG tablet Commonly known as: CRESTOR Take 1 tablet by mouth once daily   traMADol 50 MG tablet Commonly known as: Ultram Take 1 tablet (50 mg total) by mouth every 6 (six) hours as needed.   traZODone 50 MG tablet Commonly known as: DESYREL Take 50 mg by mouth at bedtime.   triamcinolone cream 0.1 % Commonly known as: KENALOG Apply 1 application topically daily as needed (psoriasis).   vitamin B-12 250 MCG tablet Commonly known as: CYANOCOBALAMIN Take 250 mcg by mouth daily.   Vitamin D 50 MCG (2000 UT) tablet Take 2,000 Units by mouth daily.          Signed: Augustin Schooling 06/24/2021, 9:15 AM  [facility] is now [facility] [address] [place], [place], [place] [address] Phone: [place]

## 2021-06-24 NOTE — Evaluation (Signed)
Occupational Therapy Evaluation Patient Details Name: Paul [name] MRN: [id number] DOB: 23-May-1946 Today's Date: 06/24/2021    History of Present Illness Patient is a 75 year old male s/p R reverse total shoulder arthroplasty. PMH includes DM, prostate cancer   Clinical Impression   Patient is a 75 year old male s/p shoulder replacement without functional use of right dominant upper extremity secondary to effects of surgery and interscalene block and shoulder precautions. Therapist provided education and instruction to patient and spouse in regards to exercises, precautions, positioning, donning upper extremity clothing and bathing while maintaining shoulder precautions, ice and edema management and donning/doffing sling. Patient and spouse verbalized understanding and demonstrated as needed. Patient needed assistance to donn shirt, underwear, pants, socks and shoes and provided with instruction on compensatory strategies to perform ADLs. Patient to follow up with MD for further therapy needs.      Follow Up Recommendations  Follow surgeon's recommendation for DC plan and follow-up therapies    Equipment Recommendations  None recommended by OT       Precautions / Restrictions Precautions Precautions: Shoulder Type of Shoulder Precautions: AROM elbow, wrist, hand ok. A/PROM shoulder No Shoulder Interventions: Shoulder sling/immobilizer;Off for dressing/bathing/exercises Precaution Booklet Issued: Yes (comment) Required Braces or Orthoses: Sling Restrictions Weight Bearing Restrictions: Yes RUE Weight Bearing: Non weight bearing      Mobility Bed Mobility Overal bed mobility: Modified Independent                  Transfers Overall transfer level: Independent Equipment used: None                  Balance Overall balance assessment: No apparent balance deficits (not formally assessed)                                         ADL either  performed or assessed with clinical judgement   ADL Overall ADL's : Needs assistance/impaired Eating/Feeding: Independent   Grooming: Independent   Upper Body Bathing: Minimal assistance   Lower Body Bathing: Minimal assistance   Upper Body Dressing : Moderate assistance;Standing Upper Body Dressing Details (indicate cue type and reason): to thread R UE and assist with buttoning Lower Body Dressing: Minimal assistance;Sitting/lateral leans;Sit to/from stand Lower Body Dressing Details (indicate cue type and reason): to pull up over R hip Toilet Transfer: Independent;Ambulation   Toileting- Clothing Manipulation and Hygiene: Minimal assistance;Sit to/from stand Toileting - Clothing Manipulation Details (indicate cue type and reason): manage buckle     Functional mobility during ADLs: Independent General ADL Comments: patient and spouse educated on compensatory strategies for self care tasks in order to maintain shoulder precautions      Pertinent Vitals/Pain Pain Assessment: Faces Faces Pain Scale: Hurts a little bit Pain Location: R UE Pain Descriptors / Indicators: Heaviness;Numbness Pain Intervention(s): Monitored during session     Hand Dominance Right   Extremity/Trunk Assessment Upper Extremity Assessment Upper Extremity Assessment: RUE deficits/detail RUE Deficits / Details: + nerve block   Lower Extremity Assessment Lower Extremity Assessment: Overall WFL for tasks assessed       Communication Communication Communication: No difficulties   Cognition Arousal/Alertness: Awake/alert Behavior During Therapy: WFL for tasks assessed/performed Overall Cognitive Status: Within Functional Limits for tasks assessed  Exercises Exercises: Shoulder   Shoulder Instructions Shoulder Instructions Donning/doffing shirt without moving shoulder: Moderate assistance;Caregiver independent with task;Patient able to  independently direct caregiver Method for sponge bathing under operated UE: Minimal assistance;Patient able to independently direct caregiver;Caregiver independent with task Donning/doffing sling/immobilizer: Caregiver independent with task;Patient able to independently direct caregiver;Moderate assistance Correct positioning of sling/immobilizer: Caregiver independent with task;Patient able to independently direct caregiver;Minimal assistance Pendulum exercises (written home exercise program):  (N/A) ROM for elbow, wrist and digits of operated UE: Patient able to independently direct caregiver;Caregiver independent with task Sling wearing schedule (on at all times/off for ADL's): Patient able to independently direct caregiver;Caregiver independent with task Proper positioning of operated UE when showering: Caregiver independent with task;Patient able to independently direct caregiver Dressing change:  (N/A) Positioning of UE while sleeping: Patient able to independently direct caregiver;Caregiver independent with task    Home Living Family/patient expects to be discharged to:: Private residence Living Arrangements: Spouse/significant other Available Help at Discharge: Family;Available 24 hours/day Type of Home: House Home Access: Stairs to enter [employer] of Steps: 7   Home Layout: Two level;Able to live on main level with bedroom/bathroom     Bathroom Shower/Tub: Teacher, early years/pre: Standard     Home Equipment: Toilet riser;Cane - single point;Crutches;Grab bars - toilet          Prior Functioning/Environment Level of Independence: Independent                 OT Problem List: Pain;Impaired UE functional use;Decreased knowledge of precautions         OT Goals(Current goals can be found in the care plan section) Acute Rehab OT Goals Patient Stated Goal: home OT Goal Formulation: All assessment and education complete, DC therapy   AM-PAC OT  "6 Clicks" Daily Activity     Outcome Measure Help from another person eating meals?: None Help from another person taking care of personal grooming?: None Help from another person toileting, which includes using toliet, bedpan, or urinal?: A Little Help from another person bathing (including washing, rinsing, drying)?: A Little Help from another person to put on and taking off regular upper body clothing?: A Lot Help from another person to put on and taking off regular lower body clothing?: A Little 6 Click Score: 19   End of Session Equipment Utilized During Treatment:  (sling) Nurse Communication: Other (comment) (OT complete)  Activity Tolerance: Patient tolerated treatment well Patient left: in bed;with call bell/phone within reach;with family/visitor present  OT Visit Diagnosis: Pain Pain - Right/Left: Right Pain - part of body: Shoulder                Time: [id number] OT Time Calculation (min): 17 min Charges:  OT General Charges $OT Visit: 1 Visit OT Evaluation $OT Eval Low Complexity: 1 Low  Delbert Phenix OT OT pager: [place] 06/24/2021, 11:19 AM

## 2021-06-26 ENCOUNTER — Encounter (HOSPITAL_COMMUNITY): Payer: Self-pay | Admitting: Orthopedic Surgery

## 2021-06-26 NOTE — Anesthesia Postprocedure Evaluation (Signed)
Anesthesia Post Note  Patient: Paul [name] [facility]  Procedure(s) Performed: REVERSE SHOULDER ARTHROPLASTY (Right: Shoulder)     Patient location during evaluation: PACU Anesthesia Type: General and Regional Level of consciousness: awake and alert Pain management: pain level controlled Vital Signs Assessment: post-procedure vital signs reviewed and stable Respiratory status: spontaneous breathing, nonlabored ventilation, respiratory function stable and patient connected to nasal cannula oxygen Cardiovascular status: blood pressure returned to baseline and stable Postop Assessment: no apparent nausea or vomiting Anesthetic complications: no   No notable events documented.  Last Vitals:  Vitals:   06/24/21 0535 06/24/21 0912  BP: 112/65 107/61  Pulse: 65 64  Resp: 16 18  Temp: 36.8 C 36.7 C  SpO2: 96% 93%    Last Pain:  Vitals:   06/24/21 0912  TempSrc: Oral  PainSc:    Pain Goal: Patients Stated Pain Goal: 2 (06/24/21 0736)                 Merlinda Frederick

## 2021-07-06 DIAGNOSIS — Z4789 Encounter for other orthopedic aftercare: Secondary | ICD-10-CM | POA: Diagnosis not present

## 2021-07-24 DIAGNOSIS — Z7984 Long term (current) use of oral hypoglycemic drugs: Secondary | ICD-10-CM | POA: Diagnosis not present

## 2021-07-24 DIAGNOSIS — E1122 Type 2 diabetes mellitus with diabetic chronic kidney disease: Secondary | ICD-10-CM | POA: Diagnosis not present

## 2021-07-24 DIAGNOSIS — R809 Proteinuria, unspecified: Secondary | ICD-10-CM | POA: Diagnosis not present

## 2021-07-24 DIAGNOSIS — E039 Hypothyroidism, unspecified: Secondary | ICD-10-CM | POA: Diagnosis not present

## 2021-07-28 DIAGNOSIS — E1122 Type 2 diabetes mellitus with diabetic chronic kidney disease: Secondary | ICD-10-CM | POA: Diagnosis not present

## 2021-07-28 DIAGNOSIS — I2510 Atherosclerotic heart disease of native coronary artery without angina pectoris: Secondary | ICD-10-CM | POA: Diagnosis not present

## 2021-07-28 DIAGNOSIS — N39498 Other specified urinary incontinence: Secondary | ICD-10-CM | POA: Diagnosis not present

## 2021-07-28 DIAGNOSIS — Z23 Encounter for immunization: Secondary | ICD-10-CM | POA: Diagnosis not present

## 2021-07-28 DIAGNOSIS — J309 Allergic rhinitis, unspecified: Secondary | ICD-10-CM | POA: Diagnosis not present

## 2021-07-28 DIAGNOSIS — K219 Gastro-esophageal reflux disease without esophagitis: Secondary | ICD-10-CM | POA: Diagnosis not present

## 2021-07-28 DIAGNOSIS — F325 Major depressive disorder, single episode, in full remission: Secondary | ICD-10-CM | POA: Diagnosis not present

## 2021-07-28 DIAGNOSIS — Z7984 Long term (current) use of oral hypoglycemic drugs: Secondary | ICD-10-CM | POA: Diagnosis not present

## 2021-07-28 DIAGNOSIS — Z8546 Personal history of malignant neoplasm of prostate: Secondary | ICD-10-CM | POA: Diagnosis not present

## 2021-07-28 DIAGNOSIS — E039 Hypothyroidism, unspecified: Secondary | ICD-10-CM | POA: Diagnosis not present

## 2021-07-28 DIAGNOSIS — E785 Hyperlipidemia, unspecified: Secondary | ICD-10-CM | POA: Diagnosis not present

## 2021-07-28 DIAGNOSIS — I10 Essential (primary) hypertension: Secondary | ICD-10-CM | POA: Diagnosis not present

## 2021-08-02 ENCOUNTER — Ambulatory Visit (HOSPITAL_COMMUNITY)
Admission: RE | Admit: 2021-08-02 | Discharge: 2021-08-02 | Disposition: A | Discharge status: Home / Self Care | Payer: PPO | Source: Ambulatory Visit | Attending: Cardiovascular Disease | Admitting: Cardiovascular Disease

## 2021-08-02 ENCOUNTER — Other Ambulatory Visit: Payer: Self-pay

## 2021-08-02 DIAGNOSIS — I771 Stricture of artery: Secondary | ICD-10-CM | POA: Diagnosis not present

## 2021-08-02 DIAGNOSIS — I6523 Occlusion and stenosis of bilateral carotid arteries: Secondary | ICD-10-CM | POA: Insufficient documentation

## 2021-08-03 ENCOUNTER — Other Ambulatory Visit: Payer: Self-pay

## 2021-08-03 DIAGNOSIS — I25810 Atherosclerosis of coronary artery bypass graft(s) without angina pectoris: Secondary | ICD-10-CM

## 2021-08-03 DIAGNOSIS — Z4789 Encounter for other orthopedic aftercare: Secondary | ICD-10-CM | POA: Diagnosis not present

## 2021-08-03 DIAGNOSIS — I6523 Occlusion and stenosis of bilateral carotid arteries: Secondary | ICD-10-CM

## 2021-08-03 DIAGNOSIS — E119 Type 2 diabetes mellitus without complications: Secondary | ICD-10-CM

## 2021-08-03 DIAGNOSIS — E782 Mixed hyperlipidemia: Secondary | ICD-10-CM

## 2021-08-03 DIAGNOSIS — I10 Essential (primary) hypertension: Secondary | ICD-10-CM

## 2021-08-03 DIAGNOSIS — I771 Stricture of artery: Secondary | ICD-10-CM

## 2021-08-03 NOTE — Progress Notes (Signed)
.  et

## 2021-08-15 DIAGNOSIS — G5602 Carpal tunnel syndrome, left upper limb: Secondary | ICD-10-CM | POA: Diagnosis not present

## 2021-08-15 DIAGNOSIS — G5601 Carpal tunnel syndrome, right upper limb: Secondary | ICD-10-CM | POA: Diagnosis not present

## 2021-09-08 DIAGNOSIS — C61 Malignant neoplasm of prostate: Secondary | ICD-10-CM | POA: Diagnosis not present

## 2021-09-14 DIAGNOSIS — Z4789 Encounter for other orthopedic aftercare: Secondary | ICD-10-CM | POA: Diagnosis not present

## 2021-09-15 DIAGNOSIS — C61 Malignant neoplasm of prostate: Secondary | ICD-10-CM | POA: Diagnosis not present

## 2021-09-15 DIAGNOSIS — N393 Stress incontinence (female) (male): Secondary | ICD-10-CM | POA: Diagnosis not present

## 2021-09-27 ENCOUNTER — Other Ambulatory Visit (HOSPITAL_COMMUNITY): Payer: Self-pay | Admitting: Cardiology

## 2021-09-27 DIAGNOSIS — I6523 Occlusion and stenosis of bilateral carotid arteries: Secondary | ICD-10-CM

## 2021-09-28 DIAGNOSIS — E782 Mixed hyperlipidemia: Secondary | ICD-10-CM | POA: Diagnosis not present

## 2021-09-28 DIAGNOSIS — E119 Type 2 diabetes mellitus without complications: Secondary | ICD-10-CM | POA: Diagnosis not present

## 2021-09-28 DIAGNOSIS — I25810 Atherosclerosis of coronary artery bypass graft(s) without angina pectoris: Secondary | ICD-10-CM | POA: Diagnosis not present

## 2021-09-28 DIAGNOSIS — I10 Essential (primary) hypertension: Secondary | ICD-10-CM | POA: Diagnosis not present

## 2021-09-29 LAB — CBC WITH DIFFERENTIAL/PLATELET
Basophils Absolute: 0 10*3/uL (ref 0.0–0.2)
Basos: 0 %
EOS (ABSOLUTE): 0.1 10*3/uL (ref 0.0–0.4)
Eos: 1 %
Hematocrit: 37.5 % (ref 37.5–51.0)
Hemoglobin: 11.3 g/dL — ABNORMAL LOW (ref 13.0–17.7)
Immature Grans (Abs): 0 10*3/uL (ref 0.0–0.1)
Immature Granulocytes: 0 %
Lymphocytes Absolute: 1.7 10*3/uL (ref 0.7–3.1)
Lymphs: 30 %
MCH: 24.5 pg — ABNORMAL LOW (ref 26.6–33.0)
MCHC: 30.1 g/dL — ABNORMAL LOW (ref 31.5–35.7)
MCV: 81 fL (ref 79–97)
Monocytes Absolute: 0.5 10*3/uL (ref 0.1–0.9)
Monocytes: 9 %
Neutrophils Absolute: 3.4 10*3/uL (ref 1.4–7.0)
Neutrophils: 60 %
Platelets: 211 10*3/uL (ref 150–450)
RBC: 4.62 x10E6/uL (ref 4.14–5.80)
RDW: 15.7 % — ABNORMAL HIGH (ref 11.6–15.4)
WBC: 5.7 10*3/uL (ref 3.4–10.8)

## 2021-09-29 LAB — BASIC METABOLIC PANEL
BUN/Creatinine Ratio: 16 (ref 10–24)
BUN: 21 mg/dL (ref 8–27)
CO2: 26 mmol/L (ref 20–29)
Calcium: 9.7 mg/dL (ref 8.6–10.2)
Chloride: 104 mmol/L (ref 96–106)
Creatinine, Ser: 1.28 mg/dL — ABNORMAL HIGH (ref 0.76–1.27)
Glucose: 129 mg/dL — ABNORMAL HIGH (ref 70–99)
Potassium: 4.9 mmol/L (ref 3.5–5.2)
Sodium: 143 mmol/L (ref 134–144)
eGFR: 58 mL/min/{1.73_m2} — ABNORMAL LOW (ref >59)

## 2021-09-29 LAB — HEPATIC FUNCTION PANEL
ALT: 17 IU/L (ref 0–44)
AST: 19 IU/L (ref 0–40)
Albumin: 4.5 g/dL (ref 3.7–4.7)
Alkaline Phosphatase: 50 IU/L (ref 44–121)
Bilirubin Total: 0.4 mg/dL (ref 0.0–1.2)
Bilirubin, Direct: 0.13 mg/dL (ref 0.00–0.40)
Total Protein: 7 g/dL (ref 6.0–8.5)

## 2021-09-29 LAB — LIPID PANEL
Chol/HDL Ratio: 3.7 ratio (ref 0.0–5.0)
Cholesterol, Total: 121 mg/dL (ref 100–199)
HDL: 33 mg/dL — ABNORMAL LOW (ref >39)
LDL Chol Calc (NIH): 54 mg/dL (ref 0–99)
Triglycerides: 209 mg/dL — ABNORMAL HIGH (ref 0–149)
VLDL Cholesterol Cal: 34 mg/dL (ref 5–40)

## 2021-09-29 LAB — HEMOGLOBIN A1C
Est. average glucose Bld gHb Est-mCnc: 157 mg/dL
Hgb A1c MFr Bld: 7.1 % — ABNORMAL HIGH (ref 4.8–5.6)

## 2021-10-02 NOTE — Progress Notes (Signed)
Cardiology Office Note:    Date:  10/05/2021   ID:  CASHTON [name], DOB August 30, 1946, MRN [id number]  PCP:  [name], MD   [facility] Providers Cardiologist:  Kinsleigh Ludolph Martinique, MD     Referring MD: [name], MD   Chief Complaint  Patient presents with   Coronary Artery Disease     History of Present Illness:    Paul [name] is a 75 y.o. male with a hx of CAD s/p CABG 2005, carotid artery disease, HTN, HLD, DM II, hypothyroidism and OSA.  Myoview obtained in November 2016 showed inferolateral infarct with peri-infarct ischemia.  Subsequent cardiac catheterization showed severe disease in SVG to OM 2 treated with stent, occluded SVG to PDA and distal RCA filled with left-to-right collaterals.  He had shortness of breath with Brilinta, after switching to Plavix, shortness of breath resolved.  He has persistent exertional fatigue.  Repeat cardiac catheterization in August 2018 showed patent grafts except for known occluded SVG to RCA, no new disease to explain his symptoms.  Carvedilol was reduced due to persistent symptoms of dizziness and fatigue.  Initially carvedilol was tapered off however this did not simply alleviate his symptoms, and was resumed.    He did undergo right shoulder surgery in August for rotator cuff tear. Had recent carpel tunnel surgery on the left. Also had recent oral surgery.   Recent carotid dopplers in October showed no change.  He reports a lot of family stress over the past 2 years. This along with his surgeries has set him back. States he started on Trazodone in the spring and felt a lot better until he had his shoulder surgery. He has some heart burn which he states he has had for years. Occasional ankle swelling that resolves with support hose. Does feel tired a lot. Only SOB if he gets up quickly.       Past Medical History:  Diagnosis Date   Allergy    Anxiety    Arthritis    Complication of anesthesia    slow to wake up    Coronary artery disease    a. s/p CABG;  b. 08/2015 MV: inf/inflat infarct w/ new peri-infarct isch; c. LHC 11/16: pLAD 70, dLAD 50, D1 100, oRI 100, mLCx 60, OM2 99, pRCA 99 (L>R collats), S-RPDA 100, S-D1 ok, S-RI/OM2 mid 85 (PCI: Synergy DES), L-LAD ok, EF 55-65%.   Depression    Dyspnea    due to meds pt on per pt   GERD (gastroesophageal reflux disease)    History of kidney stones    Hyperlipidemia    Hypertension    Hypertensive heart disease    Hypothyroidism    Insomnia    Myocardial infarction (HCC)    OSA (obstructive sleep apnea)    CPAP   Prostate cancer ([place])    Type II diabetes mellitus ([place])     Past Surgical History:  Procedure Laterality Date   CARDIAC CATHETERIZATION N/A 09/19/2015   Procedure: Coronary Stent Intervention;  Surgeon: Machai Desmith M Martinique, MD;  Location: [place] [facility];  Service: Cardiovascular;  Laterality: N/A;   CARDIAC CATHETERIZATION  09/19/2015   Procedure: Left Heart Cath and Cors/Grafts Angiography;  Surgeon: Arihant Pennings M Martinique, MD;  Location: [place] [facility];  Service: Cardiovascular;;   CHOLECYSTECTOMY     CORONARY ARTERY BYPASS GRAFT     JOINT REPLACEMENT     right knee   LEFT HEART CATH AND CORS/GRAFTS ANGIOGRAPHY N/A 06/26/2017   Procedure:  LEFT HEART CATH AND CORS/GRAFTS ANGIOGRAPHY;  Surgeon: Martinique, Nocholas Damaso M, MD;  Location: [place] [facility];  Service: Cardiovascular;  Laterality: N/A;   left knee replacement      PROSTATE SURGERY     REVERSE SHOULDER ARTHROPLASTY Right 06/23/2021   Procedure: REVERSE SHOULDER ARTHROPLASTY;  Surgeon: [name], MD;  Location: [facility];  Service: Orthopedics;  Laterality: Right;  with ISB   right carpal tunnel       Current Medications: Current Meds  Medication Sig   acetaminophen (TYLENOL) 650 MG CR tablet Take 1,300 mg by mouth every 8 (eight) hours as needed for pain.   amLODipine (NORVASC) 5 MG tablet Take 1 tablet (5 mg total) by mouth 2 (two) times daily.   aspirin EC 81 MG tablet Take 81  mg by mouth daily.   carvedilol (COREG) 12.5 MG tablet Take 1 tablet by mouth twice daily   cephALEXin (KEFLEX) 500 MG capsule Take 500 mg by mouth 4 (four) times daily.   Cholecalciferol (VITAMIN D) 2000 units tablet Take 2,000 Units by mouth daily.   ciprofloxacin (CIPRO) 500 MG tablet Take 500 mg by mouth 2 (two) times daily.   clindamycin (CLEOCIN) 150 MG capsule Take 300 mg by mouth See admin instructions. Take 300 mg 1 hour before dental procedure.   colestipol (COLESTID) 1 G tablet Take 1 g by mouth at bedtime.    Continuous Blood Gluc Receiver (FREESTYLE LIBRE 2 READER) DEVI See admin instructions.   Continuous Blood Gluc Sensor (FREESTYLE LIBRE 2 SENSOR) MISC USE AS DIRECTED TO obtain BLOOD GLUCOSE FOUR TIMES DAILY   Continuous Blood Gluc Sensor (FREESTYLE LIBRE 2 SENSOR) MISC USE AS DIRECTED TO OBTAIN BLOOD GLUCOSE FOUR TIMES DAILY   DULoxetine (CYMBALTA) 60 MG capsule Take 60 mg by mouth daily.   empagliflozin (JARDIANCE) 25 MG TABS tablet Take by mouth daily.   EPINEPHrine 0.3 mg/0.3 mL IJ SOAJ injection Inject 0.3 mg into the muscle daily as needed for anaphylaxis.   fluticasone (FLONASE) 50 MCG/ACT nasal spray Place 2 sprays into both nostrils daily as needed for allergies or rhinitis.   glipiZIDE (GLUCOTROL) 5 MG tablet Take 5 mg by mouth 2 (two) times daily.   guaiFENesin (MUCINEX) 600 MG 12 hr tablet Take 600-1,200 mg by mouth 2 (two) times daily as needed (for sinus drainage/cough/congestion).   levothyroxine (SYNTHROID) 88 MCG tablet Take 88 mcg by mouth every morning.   loperamide (IMODIUM A-D) 2 MG tablet Take 2-4 mg by mouth 4 (four) times daily as needed for diarrhea or loose stools.   losartan (COZAAR) 100 MG tablet TAKE 1 TABLET BY MOUTH ONCE DAILY.   metFORMIN (GLUCOPHAGE) 1000 MG tablet Take 1 tablet (1,000 mg total) by mouth 2 (two) times daily.   Multiple Vitamin (MULTIVITAMIN) tablet Take 1 tablet by mouth daily.   nitroGLYCERIN (NITROSTAT) 0.4 MG SL tablet Place 1  tablet (0.4 mg total) under the tongue every 5 (five) minutes as needed for chest pain.   Omega-3 Fatty Acids (FISH OIL) 1000 MG CAPS Take 1,000 mg by mouth in the morning and at bedtime.   pantoprazole (PROTONIX) 20 MG tablet Take 1 tablet (20 mg total) by mouth daily.   potassium citrate (UROCIT-K) 10 MEQ (1080 MG) SR tablet Take 10 mEq by mouth 2 (two) times daily.    rosuvastatin (CRESTOR) 10 MG tablet Take 1 tablet by mouth once daily   traMADol (ULTRAM) 50 MG tablet Take 1 tablet (50 mg total) by mouth every 6 (six)  hours as needed.   traZODone (DESYREL) 50 MG tablet Take 50 mg by mouth at bedtime.   triamcinolone cream (KENALOG) 0.1 % Apply 1 application topically daily as needed (psoriasis).   vitamin B-12 (CYANOCOBALAMIN) 250 MCG tablet Take 250 mcg by mouth daily.     Allergies:   Brilinta [ticagrelor], Yellow jacket venom, Morphine, Penicillins, Percocet [oxycodone-acetaminophen], Vicodin [hydrocodone-acetaminophen], Alprazolam, and Septra ds [sulfamethoxazole-trimethoprim]   Social History   Socioeconomic History   Marital status: Married    Spouse name: Not on file   Number of children: Not on file   Years of education: Not on file   Highest education level: Not on file  Occupational History   Not on file  Tobacco Use   Smoking status: Never   Smokeless tobacco: Never  Vaping Use   Vaping Use: Never used  Substance and Sexual Activity   Alcohol use: No    Alcohol/week: 0.0 standard drinks   Drug use: No   Sexual activity: Not on file  Other Topics Concern   Not on file  Social History Narrative   Not on file   Social Determinants of Health   Financial Resource Strain: Not on file  Food Insecurity: Not on file  Transportation Needs: Not on file  Physical Activity: Not on file  Stress: Not on file  Social Connections: Not on file     Family History: The patient's family history includes Heart attack in his father; Heart disease in his mother; Vascular  Disease in his sister.  ROS:   Please see the history of present illness.     All other systems reviewed and are negative.  EKGs/Labs/Other Studies Reviewed:    The following studies were reviewed today:  Cath 06/26/2017 Prox RCA to Dist RCA lesion, 100 %stenosed. Prox LAD to Mid LAD lesion, 70 %stenosed. Dist LAD lesion, 50 %stenosed. 1st Diag lesion, 100 %stenosed. Ost Ramus lesion, 100 %stenosed. 2nd Mrg lesion, 99 %stenosed. Mid Cx to Dist Cx lesion, 60 %stenosed. SVG to RCA- Origin lesion, 100 %stenosed. SVG sequential to ramus intermediate and OM is widely patent. SVG to diagonal is patent. LIMA and is normal in caliber. LV end diastolic pressure is normal.   1. Severe 3 vessel obstructive CAD 2. Patent LIMA to the LAD 3. Patent SVG to ramus and OM branches sequentially. Stent is widely patent 4. Patent SVG to diagonal 5. SVG to RCA known to be occluded. 6. Normal LVEDP   Plan: no new disease to explain symptoms. Recommend continued medical therapy.  Carotid doppler 08/02/21: Summary:  Right Carotid: Velocities in the right ICA are consistent with a 1-39%  stenosis.                 Hemodynamically significant plaque >50% visualized in the  CCA.                 The ECA appears >50% stenosed. Stable RICA velocities.   Left Carotid: Velocities in the left ICA are consistent with a 40-59%  stenosis.                Non-hemodynamically significant plaque <50% noted in the  CCA.                Stable LICA velocities.   Vertebrals:  Left vertebral artery demonstrates antegrade flow. Atypical               antegrade flow in the right vertebral artery.  Subclavians: Bilateral subclavian artery flow was disturbed.   *  See table(s) above for measurements and observations.  Suggest follow up study in 12 months.    Electronically signed by Carlyle Dolly MD on 08/02/2021 at 5:28:48 PM.      EKG:  EKG is not ordered today.    Recent Labs: 09/28/2021: ALT 17; BUN 21;  Creatinine, Ser 1.28; Hemoglobin 11.3; Platelets 211; Potassium 4.9; Sodium 143  Recent Lipid Panel    Component Value Date/Time   CHOL 121 09/28/2021 1143   TRIG 209 (H) 09/28/2021 1143   HDL 33 (L) 09/28/2021 1143   CHOLHDL 3.7 09/28/2021 1143   CHOLHDL 4.5 07/20/2016 0950   VLDL 47 (H) 07/20/2016 0950   LDLCALC 54 09/28/2021 1143   LDLDIRECT 76.0 02/28/2015 0939          Physical Exam:    VS:  BP 105/64   Pulse 82   Ht 5\' 9"  (1.753 m)   Wt 206 lb 3.2 oz (93.5 kg)   SpO2 96%   BMI 30.45 kg/m     Wt Readings from Last 3 Encounters:  10/05/21 206 lb 3.2 oz (93.5 kg)  06/23/21 192 lb 0.3 oz (87.1 kg)  06/14/21 192 lb (87.1 kg)     GEN:  Well nourished, well developed in no acute distress HEENT: Normal NECK: No JVD; No carotid bruits LYMPHATICS: No lymphadenopathy CARDIAC: RRR, no murmurs, rubs, gallops RESPIRATORY:  Clear to auscultation without rales, wheezing or rhonchi  ABDOMEN: Soft, non-tender, non-distended MUSCULOSKELETAL:  No edema; No deformity  SKIN: Warm and dry NEUROLOGIC:  Alert and oriented x 3 PSYCHIATRIC:  Normal affect   ASSESSMENT:    1. Coronary artery disease involving coronary bypass graft of native heart without angina pectoris   2. Essential hypertension   3. Mixed hyperlipidemia   4. Bilateral carotid artery disease, unspecified type ([place])     PLAN:    In order of problems listed above:  CAD: Denies any recent chest pain.  On aspirin, amlodipine, and carvedilol. Extensive evaluation in 2019 with cath and CPX. Continue current therapy.  Hypertension: Blood pressure is well controlled.   Hyperlipidemia: Continue Crestor. LDL 54 is at goal.  DM2: Managed by primary care provider. A1c improved to 7.1%.   Hypothyroidism on levothyroxine.    Overall appears stable from a CV standpoint. We will plan on follow up in 6 months.  Medication Adjustments/Labs and Tests Ordered: Current medicines are reviewed at length with the patient  today.  Concerns regarding medicines are outlined above.  No orders of the defined types were placed in this encounter.  No orders of the defined types were placed in this encounter.   There are no Patient Instructions on file for this visit.   Signed, Mykell Genao Martinique, MD  10/05/2021 9:40 AM    [facility]

## 2021-10-03 DIAGNOSIS — G5602 Carpal tunnel syndrome, left upper limb: Secondary | ICD-10-CM | POA: Diagnosis not present

## 2021-10-05 ENCOUNTER — Other Ambulatory Visit: Payer: Self-pay

## 2021-10-05 ENCOUNTER — Encounter: Payer: Self-pay | Admitting: Cardiology

## 2021-10-05 ENCOUNTER — Ambulatory Visit: Payer: PPO | Admitting: Cardiology

## 2021-10-05 VITALS — BP 105/64 | HR 82 | Ht 69.0 in | Wt 206.2 lb

## 2021-10-05 DIAGNOSIS — E782 Mixed hyperlipidemia: Secondary | ICD-10-CM

## 2021-10-05 DIAGNOSIS — I25810 Atherosclerosis of coronary artery bypass graft(s) without angina pectoris: Secondary | ICD-10-CM

## 2021-10-05 DIAGNOSIS — I779 Disorder of arteries and arterioles, unspecified: Secondary | ICD-10-CM | POA: Diagnosis not present

## 2021-10-05 DIAGNOSIS — I10 Essential (primary) hypertension: Secondary | ICD-10-CM | POA: Diagnosis not present

## 2021-10-16 DIAGNOSIS — Z4789 Encounter for other orthopedic aftercare: Secondary | ICD-10-CM | POA: Diagnosis not present

## 2021-10-16 DIAGNOSIS — G5602 Carpal tunnel syndrome, left upper limb: Secondary | ICD-10-CM | POA: Diagnosis not present

## 2021-10-26 IMAGING — DX DG SHOULDER 2+V PORT*R*
1 series · 1 of 1 positions shown · non-contrast
Comparison: None.

CLINICAL DATA: Shoulder replacement

EXAM:
PORTABLE RIGHT SHOULDER

[shoulder ap]
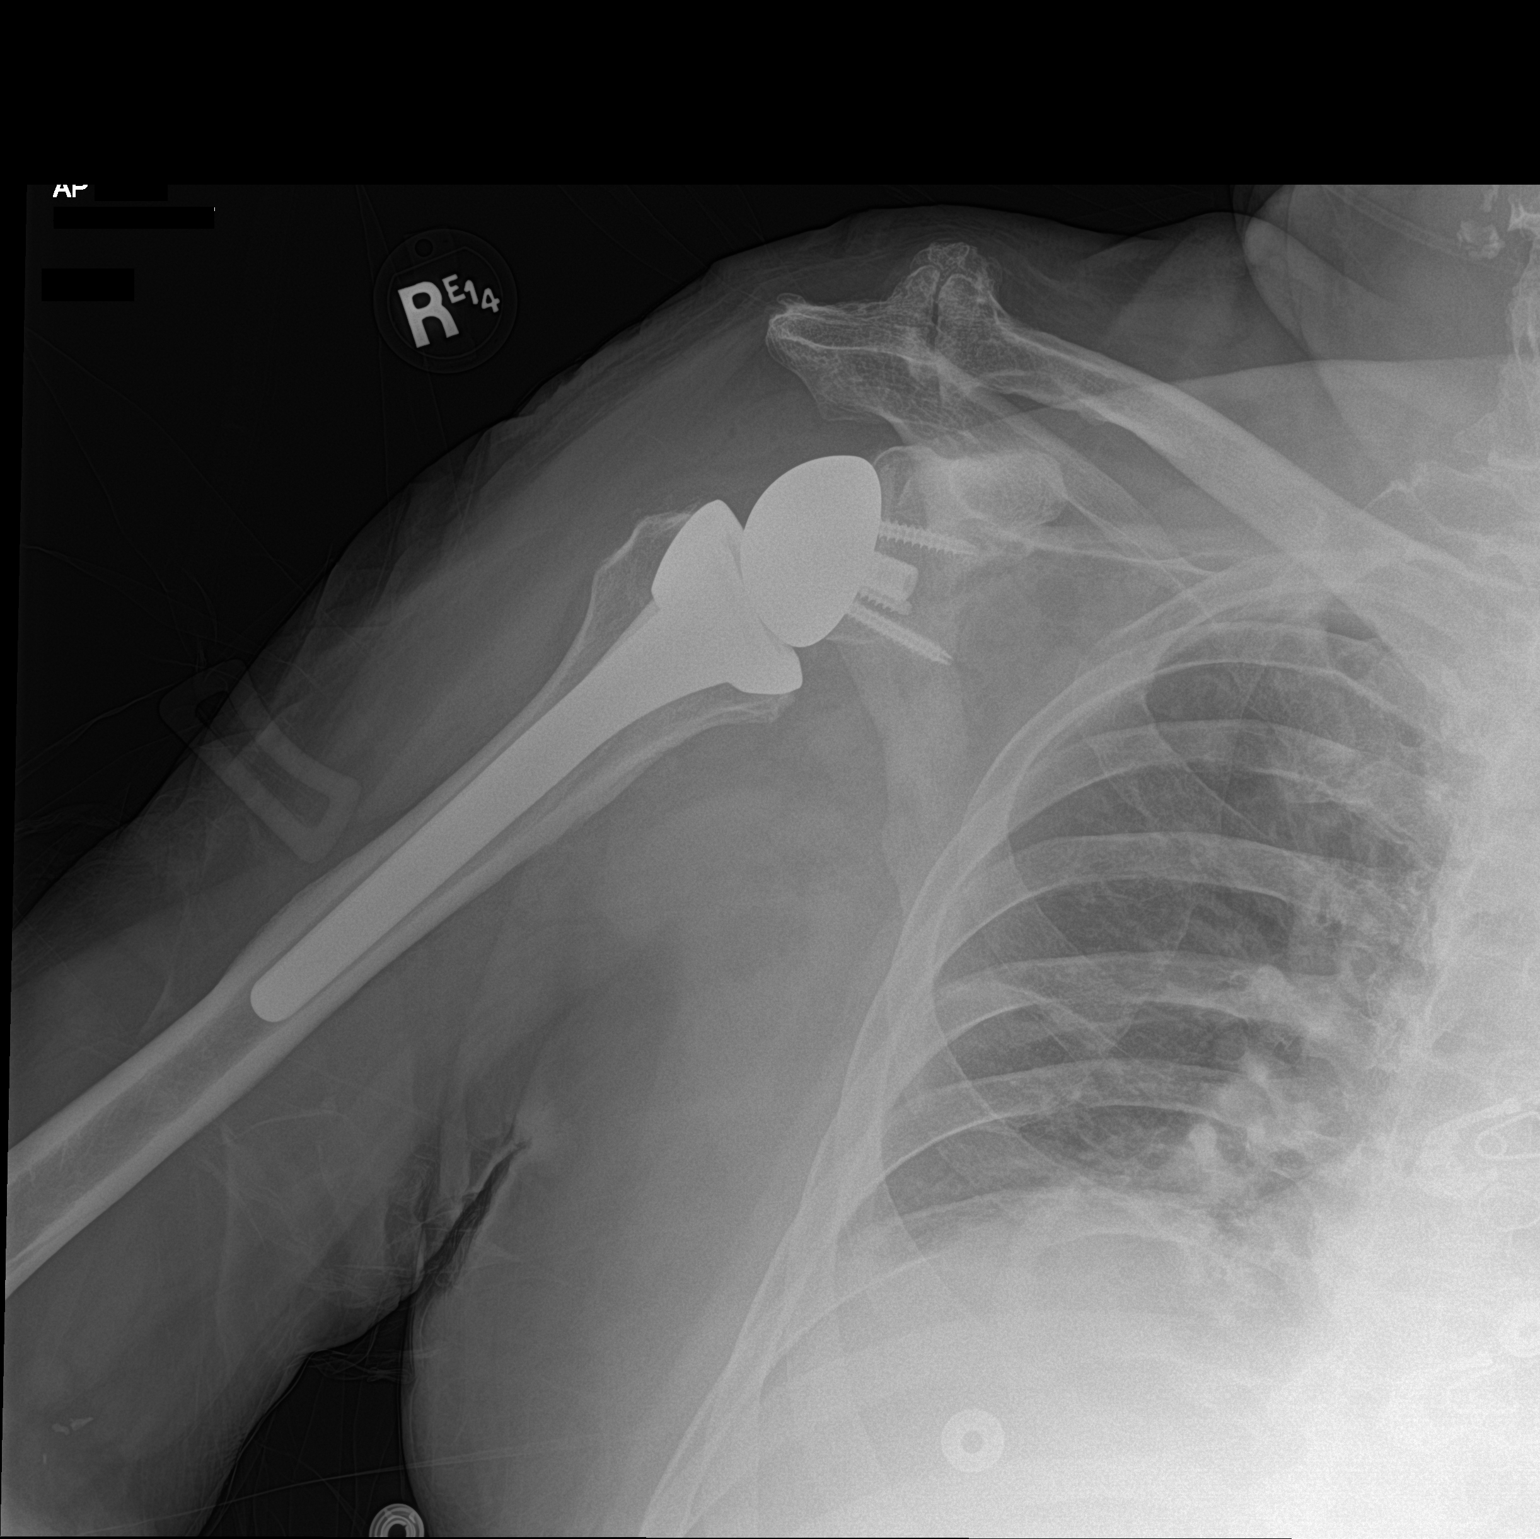

[1 of 1 positions shown; findings below may reference images not displayed]

FINDINGS: Total shoulder arthroplasty. Prosthetic components are well seated.
No fracture or dislocation. Severe degenerative change of the
acromioclavicular joint.
IMPRESSION: No complication following shoulder arthroplasty.

## 2021-10-27 DIAGNOSIS — E1122 Type 2 diabetes mellitus with diabetic chronic kidney disease: Secondary | ICD-10-CM | POA: Diagnosis not present

## 2021-10-27 DIAGNOSIS — M159 Polyosteoarthritis, unspecified: Secondary | ICD-10-CM | POA: Diagnosis not present

## 2021-10-27 DIAGNOSIS — F325 Major depressive disorder, single episode, in full remission: Secondary | ICD-10-CM | POA: Diagnosis not present

## 2021-10-27 DIAGNOSIS — E039 Hypothyroidism, unspecified: Secondary | ICD-10-CM | POA: Diagnosis not present

## 2021-10-27 DIAGNOSIS — E1165 Type 2 diabetes mellitus with hyperglycemia: Secondary | ICD-10-CM | POA: Diagnosis not present

## 2021-10-27 DIAGNOSIS — K219 Gastro-esophageal reflux disease without esophagitis: Secondary | ICD-10-CM | POA: Diagnosis not present

## 2021-10-27 DIAGNOSIS — I2510 Atherosclerotic heart disease of native coronary artery without angina pectoris: Secondary | ICD-10-CM | POA: Diagnosis not present

## 2021-10-27 DIAGNOSIS — E785 Hyperlipidemia, unspecified: Secondary | ICD-10-CM | POA: Diagnosis not present

## 2021-10-27 DIAGNOSIS — E1142 Type 2 diabetes mellitus with diabetic polyneuropathy: Secondary | ICD-10-CM | POA: Diagnosis not present

## 2021-10-27 DIAGNOSIS — I10 Essential (primary) hypertension: Secondary | ICD-10-CM | POA: Diagnosis not present

## 2021-10-27 DIAGNOSIS — N183 Chronic kidney disease, stage 3 unspecified: Secondary | ICD-10-CM | POA: Diagnosis not present

## 2021-11-17 DIAGNOSIS — G4733 Obstructive sleep apnea (adult) (pediatric): Secondary | ICD-10-CM | POA: Diagnosis not present

## 2021-11-23 DIAGNOSIS — E1165 Type 2 diabetes mellitus with hyperglycemia: Secondary | ICD-10-CM | POA: Diagnosis not present

## 2021-11-23 DIAGNOSIS — E1142 Type 2 diabetes mellitus with diabetic polyneuropathy: Secondary | ICD-10-CM | POA: Diagnosis not present

## 2021-11-23 DIAGNOSIS — I10 Essential (primary) hypertension: Secondary | ICD-10-CM | POA: Diagnosis not present

## 2021-11-23 DIAGNOSIS — E785 Hyperlipidemia, unspecified: Secondary | ICD-10-CM | POA: Diagnosis not present

## 2021-11-23 DIAGNOSIS — N183 Chronic kidney disease, stage 3 unspecified: Secondary | ICD-10-CM | POA: Diagnosis not present

## 2021-11-23 DIAGNOSIS — E1122 Type 2 diabetes mellitus with diabetic chronic kidney disease: Secondary | ICD-10-CM | POA: Diagnosis not present

## 2021-11-23 DIAGNOSIS — E039 Hypothyroidism, unspecified: Secondary | ICD-10-CM | POA: Diagnosis not present

## 2021-11-29 DIAGNOSIS — Z4789 Encounter for other orthopedic aftercare: Secondary | ICD-10-CM | POA: Diagnosis not present

## 2021-11-30 DIAGNOSIS — E1151 Type 2 diabetes mellitus with diabetic peripheral angiopathy without gangrene: Secondary | ICD-10-CM | POA: Diagnosis not present

## 2021-11-30 DIAGNOSIS — M5432 Sciatica, left side: Secondary | ICD-10-CM | POA: Diagnosis not present

## 2021-11-30 DIAGNOSIS — Z7982 Long term (current) use of aspirin: Secondary | ICD-10-CM | POA: Diagnosis not present

## 2021-11-30 DIAGNOSIS — E1142 Type 2 diabetes mellitus with diabetic polyneuropathy: Secondary | ICD-10-CM | POA: Diagnosis not present

## 2021-11-30 DIAGNOSIS — E1122 Type 2 diabetes mellitus with diabetic chronic kidney disease: Secondary | ICD-10-CM | POA: Diagnosis not present

## 2021-11-30 DIAGNOSIS — Z951 Presence of aortocoronary bypass graft: Secondary | ICD-10-CM | POA: Diagnosis not present

## 2021-11-30 DIAGNOSIS — I252 Old myocardial infarction: Secondary | ICD-10-CM | POA: Diagnosis not present

## 2021-11-30 DIAGNOSIS — G4733 Obstructive sleep apnea (adult) (pediatric): Secondary | ICD-10-CM | POA: Diagnosis not present

## 2021-11-30 DIAGNOSIS — I25118 Atherosclerotic heart disease of native coronary artery with other forms of angina pectoris: Secondary | ICD-10-CM | POA: Diagnosis not present

## 2021-11-30 DIAGNOSIS — Z7984 Long term (current) use of oral hypoglycemic drugs: Secondary | ICD-10-CM | POA: Diagnosis not present

## 2021-11-30 DIAGNOSIS — F330 Major depressive disorder, recurrent, mild: Secondary | ICD-10-CM | POA: Diagnosis not present

## 2021-11-30 DIAGNOSIS — N183 Chronic kidney disease, stage 3 unspecified: Secondary | ICD-10-CM | POA: Diagnosis not present

## 2021-12-06 DIAGNOSIS — J020 Streptococcal pharyngitis: Secondary | ICD-10-CM | POA: Diagnosis not present

## 2021-12-06 DIAGNOSIS — J029 Acute pharyngitis, unspecified: Secondary | ICD-10-CM | POA: Diagnosis not present

## 2022-01-22 DIAGNOSIS — E039 Hypothyroidism, unspecified: Secondary | ICD-10-CM | POA: Diagnosis not present

## 2022-01-22 DIAGNOSIS — E785 Hyperlipidemia, unspecified: Secondary | ICD-10-CM | POA: Diagnosis not present

## 2022-01-22 DIAGNOSIS — K219 Gastro-esophageal reflux disease without esophagitis: Secondary | ICD-10-CM | POA: Diagnosis not present

## 2022-01-22 DIAGNOSIS — N1831 Chronic kidney disease, stage 3a: Secondary | ICD-10-CM | POA: Diagnosis not present

## 2022-01-22 DIAGNOSIS — E1122 Type 2 diabetes mellitus with diabetic chronic kidney disease: Secondary | ICD-10-CM | POA: Diagnosis not present

## 2022-01-26 DIAGNOSIS — I2510 Atherosclerotic heart disease of native coronary artery without angina pectoris: Secondary | ICD-10-CM | POA: Diagnosis not present

## 2022-01-26 DIAGNOSIS — G4733 Obstructive sleep apnea (adult) (pediatric): Secondary | ICD-10-CM | POA: Diagnosis not present

## 2022-01-26 DIAGNOSIS — E559 Vitamin D deficiency, unspecified: Secondary | ICD-10-CM | POA: Diagnosis not present

## 2022-01-26 DIAGNOSIS — N1831 Chronic kidney disease, stage 3a: Secondary | ICD-10-CM | POA: Diagnosis not present

## 2022-01-26 DIAGNOSIS — I10 Essential (primary) hypertension: Secondary | ICD-10-CM | POA: Diagnosis not present

## 2022-01-26 DIAGNOSIS — F3342 Major depressive disorder, recurrent, in full remission: Secondary | ICD-10-CM | POA: Diagnosis not present

## 2022-01-26 DIAGNOSIS — J309 Allergic rhinitis, unspecified: Secondary | ICD-10-CM | POA: Diagnosis not present

## 2022-01-26 DIAGNOSIS — R809 Proteinuria, unspecified: Secondary | ICD-10-CM | POA: Diagnosis not present

## 2022-01-26 DIAGNOSIS — E1122 Type 2 diabetes mellitus with diabetic chronic kidney disease: Secondary | ICD-10-CM | POA: Diagnosis not present

## 2022-01-26 DIAGNOSIS — K219 Gastro-esophageal reflux disease without esophagitis: Secondary | ICD-10-CM | POA: Diagnosis not present

## 2022-01-26 DIAGNOSIS — E039 Hypothyroidism, unspecified: Secondary | ICD-10-CM | POA: Diagnosis not present

## 2022-01-26 DIAGNOSIS — E785 Hyperlipidemia, unspecified: Secondary | ICD-10-CM | POA: Diagnosis not present

## 2022-03-20 DIAGNOSIS — G4733 Obstructive sleep apnea (adult) (pediatric): Secondary | ICD-10-CM | POA: Diagnosis not present

## 2022-03-20 DIAGNOSIS — M25512 Pain in left shoulder: Secondary | ICD-10-CM | POA: Diagnosis not present

## 2022-03-20 DIAGNOSIS — Z96611 Presence of right artificial shoulder joint: Secondary | ICD-10-CM | POA: Diagnosis not present

## 2022-03-28 DIAGNOSIS — Z96611 Presence of right artificial shoulder joint: Secondary | ICD-10-CM | POA: Diagnosis not present

## 2022-03-28 DIAGNOSIS — M25512 Pain in left shoulder: Secondary | ICD-10-CM | POA: Diagnosis not present

## 2022-03-28 DIAGNOSIS — M25511 Pain in right shoulder: Secondary | ICD-10-CM | POA: Diagnosis not present

## 2022-04-02 DIAGNOSIS — Z96611 Presence of right artificial shoulder joint: Secondary | ICD-10-CM | POA: Diagnosis not present

## 2022-04-02 DIAGNOSIS — M25512 Pain in left shoulder: Secondary | ICD-10-CM | POA: Diagnosis not present

## 2022-04-02 DIAGNOSIS — M25511 Pain in right shoulder: Secondary | ICD-10-CM | POA: Diagnosis not present

## 2022-04-02 NOTE — Progress Notes (Deleted)
Cardiology Office Note:    Date:  04/02/2022   ID:  Paul [name], DOB [birth year], MRN [id number]  PCP:  [name], MD   [place] Providers Cardiologist:  Elber Galyean Martinique, MD     Referring MD: [name], MD   No chief complaint on file.    History of Present Illness:    Paul [name] is a 76 y.o. male with a hx of CAD s/p CABG 2005, carotid artery disease, HTN, HLD, DM II, hypothyroidism and OSA.  Myoview obtained in November 2016 showed inferolateral infarct with peri-infarct ischemia.  Subsequent cardiac catheterization showed severe disease in SVG to OM 2 treated with stent, occluded SVG to PDA and distal RCA filled with left-to-right collaterals.  He had shortness of breath with Brilinta, after switching to Plavix, shortness of breath resolved.  He has persistent exertional fatigue.  Repeat cardiac catheterization in August 2018 showed patent grafts except for known occluded SVG to RCA, no new disease to explain his symptoms.  Carvedilol was reduced due to persistent symptoms of dizziness and fatigue.  Initially carvedilol was tapered off however this did not simply alleviate his symptoms, and was resumed.    He did undergo right shoulder surgery in August for rotator cuff tear. Had recent carpel tunnel surgery on the left. Also had recent oral surgery.   Recent carotid dopplers in October showed no change.  He reports a lot of family stress over the past 2 years. This along with his surgeries has set him back. States he started on Trazodone in the spring and felt a lot better until he had his shoulder surgery. He has some heart burn which he states he has had for years. Occasional ankle swelling that resolves with support hose. Does feel tired a lot. Only SOB if he gets up quickly.       Past Medical History:  Diagnosis Date   Allergy    Anxiety    Arthritis    Complication of anesthesia    slow to wake up   Coronary artery disease    a. s/p CABG;  b.  08/2015 MV: inf/inflat infarct w/ new peri-infarct isch; c. LHC 11/16: pLAD 70, dLAD 50, D1 100, oRI 100, mLCx 60, OM2 99, pRCA 99 (L>R collats), S-RPDA 100, S-D1 ok, S-RI/OM2 mid 85 (PCI: Synergy DES), L-LAD ok, EF 55-65%.   Depression    Dyspnea    due to meds pt on per pt   GERD (gastroesophageal reflux disease)    History of kidney stones    Hyperlipidemia    Hypertension    Hypertensive heart disease    Hypothyroidism    Insomnia    Myocardial infarction (HCC)    OSA (obstructive sleep apnea)    CPAP   Prostate cancer ([place])    Type II diabetes mellitus ([place])     Past Surgical History:  Procedure Laterality Date   CARDIAC CATHETERIZATION N/A 09/19/2015   Procedure: Coronary Stent Intervention;  Surgeon: Lahna Nath M Martinique, MD;  Location: [place] [facility];  Service: Cardiovascular;  Laterality: N/A;   CARDIAC CATHETERIZATION  09/19/2015   Procedure: Left Heart Cath and Cors/Grafts Angiography;  Surgeon: Dorianne Perret M Martinique, MD;  Location: [place] [facility];  Service: Cardiovascular;;   CHOLECYSTECTOMY     CORONARY ARTERY BYPASS GRAFT     JOINT REPLACEMENT     right knee   LEFT HEART CATH AND CORS/GRAFTS ANGIOGRAPHY N/A 06/26/2017   Procedure: LEFT HEART CATH AND CORS/GRAFTS ANGIOGRAPHY;  Surgeon: Martinique, Kyliee Ortego M, MD;  Location: [place] [facility];  Service: Cardiovascular;  Laterality: N/A;   left knee replacement      PROSTATE SURGERY     REVERSE SHOULDER ARTHROPLASTY Right 06/23/2021   Procedure: REVERSE SHOULDER ARTHROPLASTY;  Surgeon: [name], MD;  Location: [facility];  Service: Orthopedics;  Laterality: Right;  with ISB   right carpal tunnel       Current Medications: No outpatient medications have been marked as taking for the 04/05/22 encounter (Appointment) with Martinique, Gisella Alwine M, MD.     Allergies:   Brilinta [ticagrelor], Yellow jacket venom, Morphine, Penicillins, Percocet [oxycodone-acetaminophen], Vicodin [hydrocodone-acetaminophen], Alprazolam, and Septra ds  [sulfamethoxazole-trimethoprim]   Social History   Socioeconomic History   Marital status: Married    Spouse name: Not on file   Number of children: Not on file   Years of education: Not on file   Highest education level: Not on file  Occupational History   Not on file  Tobacco Use   Smoking status: Never   Smokeless tobacco: Never  Vaping Use   Vaping Use: Never used  Substance and Sexual Activity   Alcohol use: No    Alcohol/week: 0.0 standard drinks   Drug use: No   Sexual activity: Not on file  Other Topics Concern   Not on file  Social History Narrative   Not on file   Social Determinants of Health   Financial Resource Strain: Not on file  Food Insecurity: Not on file  Transportation Needs: Not on file  Physical Activity: Not on file  Stress: Not on file  Social Connections: Not on file     Family History: The patient's family history includes Heart attack in his father; Heart disease in his mother; Vascular Disease in his sister.  ROS:   Please see the history of present illness.     All other systems reviewed and are negative.  EKGs/Labs/Other Studies Reviewed:    The following studies were reviewed today:  Cath 06/26/2017 Prox RCA to Dist RCA lesion, 100 %stenosed. Prox LAD to Mid LAD lesion, 70 %stenosed. Dist LAD lesion, 50 %stenosed. 1st Diag lesion, 100 %stenosed. Ost Ramus lesion, 100 %stenosed. 2nd Mrg lesion, 99 %stenosed. Mid Cx to Dist Cx lesion, 60 %stenosed. SVG to RCA- Origin lesion, 100 %stenosed. SVG sequential to ramus intermediate and OM is widely patent. SVG to diagonal is patent. LIMA and is normal in caliber. LV end diastolic pressure is normal.   1. Severe 3 vessel obstructive CAD 2. Patent LIMA to the LAD 3. Patent SVG to ramus and OM branches sequentially. Stent is widely patent 4. Patent SVG to diagonal 5. SVG to RCA known to be occluded. 6. Normal LVEDP   Plan: no new disease to explain symptoms. Recommend continued  medical therapy.  Carotid doppler 08/02/21: Summary:  Right Carotid: Velocities in the right ICA are consistent with a 1-39%  stenosis.                 Hemodynamically significant plaque >50% visualized in the  CCA.                 The ECA appears >50% stenosed. Stable RICA velocities.   Left Carotid: Velocities in the left ICA are consistent with a 40-59%  stenosis.                Non-hemodynamically significant plaque <50% noted in the  CCA.  Stable LICA velocities.   Vertebrals:  Left vertebral artery demonstrates antegrade flow. Atypical               antegrade flow in the right vertebral artery.  Subclavians: Bilateral subclavian artery flow was disturbed.   *See table(s) above for measurements and observations.  Suggest follow up study in 12 months.    Electronically signed by Carlyle Dolly MD on 08/02/2021 at 5:28:48 PM.      EKG:  EKG is not ordered today.    Recent Labs: 09/28/2021: ALT 17; BUN 21; Creatinine, Ser 1.28; Hemoglobin 11.3; Platelets 211; Potassium 4.9; Sodium 143  Recent Lipid Panel    Component Value Date/Time   CHOL 121 09/28/2021 1143   TRIG 209 (H) 09/28/2021 1143   HDL 33 (L) 09/28/2021 1143   CHOLHDL 3.7 09/28/2021 1143   CHOLHDL 4.5 07/20/2016 0950   VLDL 47 (H) 07/20/2016 0950   LDLCALC 54 09/28/2021 1143   LDLDIRECT 76.0 02/28/2015 0939    Dated 01/22/22: A1c 7.3%. cholesterol 123, triglycerides 233, HDL 34, LDL 52. Creatinine 1.37. potassium 5.0. otherwise CMET and TSH normal.       Physical Exam:    VS:  There were no vitals taken for this visit.    Wt Readings from Last 3 Encounters:  10/05/21 206 lb 3.2 oz (93.5 kg)  06/23/21 192 lb 0.3 oz (87.1 kg)  06/14/21 192 lb (87.1 kg)     GEN:  Well nourished, well developed in no acute distress HEENT: Normal NECK: No JVD; No carotid bruits LYMPHATICS: No lymphadenopathy CARDIAC: RRR, no murmurs, rubs, gallops RESPIRATORY:  Clear to auscultation without rales,  wheezing or rhonchi  ABDOMEN: Soft, non-tender, non-distended MUSCULOSKELETAL:  No edema; No deformity  SKIN: Warm and dry NEUROLOGIC:  Alert and oriented x 3 PSYCHIATRIC:  Normal affect   ASSESSMENT:    No diagnosis found.   PLAN:    In order of problems listed above:  CAD: Denies any recent chest pain.  On aspirin, amlodipine, and carvedilol. Extensive evaluation in 2019 with cath and CPX. Continue current therapy.  Hypertension: Blood pressure is well controlled.   Hyperlipidemia: Continue Crestor. LDL 54 is at goal.  DM2: Managed by primary care provider. A1c improved to 7.1%.   Hypothyroidism on levothyroxine.    Overall appears stable from a CV standpoint. We will plan on follow up in 6 months.  Medication Adjustments/Labs and Tests Ordered: Current medicines are reviewed at length with the patient today.  Concerns regarding medicines are outlined above.  No orders of the defined types were placed in this encounter.   No orders of the defined types were placed in this encounter.    There are no Patient Instructions on file for this visit.   Signed, Mikisha Roseland Martinique, MD  04/02/2022 7:49 AM    [facility]

## 2022-04-04 DIAGNOSIS — M25512 Pain in left shoulder: Secondary | ICD-10-CM | POA: Diagnosis not present

## 2022-04-04 DIAGNOSIS — Z96611 Presence of right artificial shoulder joint: Secondary | ICD-10-CM | POA: Diagnosis not present

## 2022-04-04 DIAGNOSIS — M25511 Pain in right shoulder: Secondary | ICD-10-CM | POA: Diagnosis not present

## 2022-04-05 ENCOUNTER — Ambulatory Visit: Payer: PPO | Admitting: Cardiology

## 2022-04-06 DIAGNOSIS — M25511 Pain in right shoulder: Secondary | ICD-10-CM | POA: Diagnosis not present

## 2022-04-06 DIAGNOSIS — Z96611 Presence of right artificial shoulder joint: Secondary | ICD-10-CM | POA: Diagnosis not present

## 2022-04-06 DIAGNOSIS — M25512 Pain in left shoulder: Secondary | ICD-10-CM | POA: Diagnosis not present

## 2022-04-09 DIAGNOSIS — Z96611 Presence of right artificial shoulder joint: Secondary | ICD-10-CM | POA: Diagnosis not present

## 2022-04-09 DIAGNOSIS — M25512 Pain in left shoulder: Secondary | ICD-10-CM | POA: Diagnosis not present

## 2022-04-09 DIAGNOSIS — M25511 Pain in right shoulder: Secondary | ICD-10-CM | POA: Diagnosis not present

## 2022-04-11 DIAGNOSIS — Z96611 Presence of right artificial shoulder joint: Secondary | ICD-10-CM | POA: Diagnosis not present

## 2022-04-11 DIAGNOSIS — M25511 Pain in right shoulder: Secondary | ICD-10-CM | POA: Diagnosis not present

## 2022-04-11 DIAGNOSIS — M25512 Pain in left shoulder: Secondary | ICD-10-CM | POA: Diagnosis not present

## 2022-04-16 DIAGNOSIS — M25511 Pain in right shoulder: Secondary | ICD-10-CM | POA: Diagnosis not present

## 2022-04-16 DIAGNOSIS — Z96611 Presence of right artificial shoulder joint: Secondary | ICD-10-CM | POA: Diagnosis not present

## 2022-04-16 DIAGNOSIS — M25512 Pain in left shoulder: Secondary | ICD-10-CM | POA: Diagnosis not present

## 2022-04-18 DIAGNOSIS — M25511 Pain in right shoulder: Secondary | ICD-10-CM | POA: Diagnosis not present

## 2022-04-18 DIAGNOSIS — M25512 Pain in left shoulder: Secondary | ICD-10-CM | POA: Diagnosis not present

## 2022-04-18 DIAGNOSIS — Z96611 Presence of right artificial shoulder joint: Secondary | ICD-10-CM | POA: Diagnosis not present

## 2022-04-20 DIAGNOSIS — M25511 Pain in right shoulder: Secondary | ICD-10-CM | POA: Diagnosis not present

## 2022-04-20 DIAGNOSIS — Z96611 Presence of right artificial shoulder joint: Secondary | ICD-10-CM | POA: Diagnosis not present

## 2022-04-20 DIAGNOSIS — M25512 Pain in left shoulder: Secondary | ICD-10-CM | POA: Diagnosis not present

## 2022-04-23 DIAGNOSIS — Z96611 Presence of right artificial shoulder joint: Secondary | ICD-10-CM | POA: Diagnosis not present

## 2022-04-23 DIAGNOSIS — M25512 Pain in left shoulder: Secondary | ICD-10-CM | POA: Diagnosis not present

## 2022-04-23 DIAGNOSIS — M25511 Pain in right shoulder: Secondary | ICD-10-CM | POA: Diagnosis not present

## 2022-04-25 DIAGNOSIS — M25511 Pain in right shoulder: Secondary | ICD-10-CM | POA: Diagnosis not present

## 2022-04-25 DIAGNOSIS — Z96611 Presence of right artificial shoulder joint: Secondary | ICD-10-CM | POA: Diagnosis not present

## 2022-04-25 DIAGNOSIS — M25512 Pain in left shoulder: Secondary | ICD-10-CM | POA: Diagnosis not present

## 2022-04-27 DIAGNOSIS — M25512 Pain in left shoulder: Secondary | ICD-10-CM | POA: Diagnosis not present

## 2022-04-27 DIAGNOSIS — M25511 Pain in right shoulder: Secondary | ICD-10-CM | POA: Diagnosis not present

## 2022-04-27 DIAGNOSIS — Z96611 Presence of right artificial shoulder joint: Secondary | ICD-10-CM | POA: Diagnosis not present

## 2022-05-02 DIAGNOSIS — M25511 Pain in right shoulder: Secondary | ICD-10-CM | POA: Diagnosis not present

## 2022-05-02 DIAGNOSIS — Z96611 Presence of right artificial shoulder joint: Secondary | ICD-10-CM | POA: Diagnosis not present

## 2022-05-02 DIAGNOSIS — M25512 Pain in left shoulder: Secondary | ICD-10-CM | POA: Diagnosis not present

## 2022-05-07 DIAGNOSIS — Z96611 Presence of right artificial shoulder joint: Secondary | ICD-10-CM | POA: Diagnosis not present

## 2022-05-07 DIAGNOSIS — M25512 Pain in left shoulder: Secondary | ICD-10-CM | POA: Diagnosis not present

## 2022-05-07 DIAGNOSIS — M25511 Pain in right shoulder: Secondary | ICD-10-CM | POA: Diagnosis not present

## 2022-05-10 DIAGNOSIS — M25511 Pain in right shoulder: Secondary | ICD-10-CM | POA: Diagnosis not present

## 2022-05-10 DIAGNOSIS — Z96611 Presence of right artificial shoulder joint: Secondary | ICD-10-CM | POA: Diagnosis not present

## 2022-05-10 DIAGNOSIS — M25512 Pain in left shoulder: Secondary | ICD-10-CM | POA: Diagnosis not present

## 2022-05-15 DIAGNOSIS — M25512 Pain in left shoulder: Secondary | ICD-10-CM | POA: Diagnosis not present

## 2022-05-15 DIAGNOSIS — M25511 Pain in right shoulder: Secondary | ICD-10-CM | POA: Diagnosis not present

## 2022-05-15 DIAGNOSIS — Z96611 Presence of right artificial shoulder joint: Secondary | ICD-10-CM | POA: Diagnosis not present

## 2022-05-18 DIAGNOSIS — M25511 Pain in right shoulder: Secondary | ICD-10-CM | POA: Diagnosis not present

## 2022-05-18 DIAGNOSIS — M25512 Pain in left shoulder: Secondary | ICD-10-CM | POA: Diagnosis not present

## 2022-05-18 DIAGNOSIS — Z96611 Presence of right artificial shoulder joint: Secondary | ICD-10-CM | POA: Diagnosis not present

## 2022-05-21 DIAGNOSIS — C61 Malignant neoplasm of prostate: Secondary | ICD-10-CM | POA: Diagnosis not present

## 2022-05-22 NOTE — Progress Notes (Deleted)
Cardiology Office Note:    Date:  05/22/2022   ID:  Paul [name], DOB [birth year], MRN [id number]  PCP:  [name], MD   [facility] Cardiologist:  Cayden Granholm Martinique, MD     Referring MD: [name], MD   No chief complaint on file.    History of Present Illness:    Paul [name] is a 76 y.o. male with a hx of CAD s/p CABG 2005, carotid artery disease, HTN, HLD, DM II, hypothyroidism and OSA.  Myoview obtained in November 2016 showed inferolateral infarct with peri-infarct ischemia.  Subsequent cardiac catheterization showed severe disease in SVG to OM 2 treated with stent, occluded SVG to PDA and distal RCA filled with left-to-right collaterals.  He had shortness of breath with Brilinta, after switching to Plavix, shortness of breath resolved.  He has persistent exertional fatigue.  Repeat cardiac catheterization in August 2018 showed patent grafts except for known occluded SVG to RCA, no new disease to explain his symptoms.  Carvedilol was reduced due to persistent symptoms of dizziness and fatigue.  Initially carvedilol was tapered off however this did not simply alleviate his symptoms, and was resumed.    He did undergo right shoulder surgery in August for rotator cuff tear. Had recent carpel tunnel surgery on the left. Also had recent oral surgery.   Recent carotid dopplers in October showed no change.  He reports a lot of family stress over the past 2 years. This along with his surgeries has set him back. States he started on Trazodone in the spring and felt a lot better until he had his shoulder surgery. He has some heart burn which he states he has had for years. Occasional ankle swelling that resolves with support hose. Does feel tired a lot. Only SOB if he gets up quickly.       Past Medical History:  Diagnosis Date   Allergy    Anxiety    Arthritis    Complication of anesthesia    slow to wake up   Coronary artery disease    a. s/p CABG;  b.  08/2015 MV: inf/inflat infarct w/ new peri-infarct isch; c. LHC 11/16: pLAD 70, dLAD 50, D1 100, oRI 100, mLCx 60, OM2 99, pRCA 99 (L>R collats), S-RPDA 100, S-D1 ok, S-RI/OM2 mid 85 (PCI: Synergy DES), L-LAD ok, EF 55-65%.   Depression    Dyspnea    due to meds pt on per pt   GERD (gastroesophageal reflux disease)    History of kidney stones    Hyperlipidemia    Hypertension    Hypertensive heart disease    Hypothyroidism    Insomnia    Myocardial infarction (HCC)    OSA (obstructive sleep apnea)    CPAP   Prostate cancer ([place])    Type II diabetes mellitus ([place])     Past Surgical History:  Procedure Laterality Date   CARDIAC CATHETERIZATION N/A 09/19/2015   Procedure: Coronary Stent Intervention;  Surgeon: Mariza Bourget M Martinique, MD;  Location: [facility];  Service: Cardiovascular;  Laterality: N/A;   CARDIAC CATHETERIZATION  09/19/2015   Procedure: Left Heart Cath and Cors/Grafts Angiography;  Surgeon: Tranell Wojtkiewicz M Martinique, MD;  Location: [facility];  Service: Cardiovascular;;   CHOLECYSTECTOMY     CORONARY ARTERY BYPASS GRAFT     JOINT REPLACEMENT     right knee   LEFT HEART CATH AND CORS/GRAFTS ANGIOGRAPHY N/A 06/26/2017   Procedure: LEFT HEART CATH AND CORS/GRAFTS ANGIOGRAPHY;  Surgeon: Martinique, Auden Wettstein M, MD;  Location: [facility];  Service: Cardiovascular;  Laterality: N/A;   left knee replacement      PROSTATE SURGERY     REVERSE SHOULDER ARTHROPLASTY Right 06/23/2021   Procedure: REVERSE SHOULDER ARTHROPLASTY;  Surgeon: [name], MD;  Location: [facility];  Service: Orthopedics;  Laterality: Right;  with ISB   right carpal tunnel       Current Medications: No outpatient medications have been marked as taking for the 05/28/22 encounter (Appointment) with Martinique, Bayleigh Loflin M, MD.     Allergies:   Brilinta [ticagrelor], Yellow jacket venom, Morphine, Penicillins, Percocet [oxycodone-acetaminophen], Vicodin [hydrocodone-acetaminophen], Alprazolam, and Septra ds  [sulfamethoxazole-trimethoprim]   Social History   Socioeconomic History   Marital status: Married    Spouse name: Not on file   Number of children: Not on file   Years of education: Not on file   Highest education level: Not on file  Occupational History   Not on file  Tobacco Use   Smoking status: Never   Smokeless tobacco: Never  Vaping Use   Vaping Use: Never used  Substance and Sexual Activity   Alcohol use: No    Alcohol/week: 0.0 standard drinks of alcohol   Drug use: No   Sexual activity: Not on file  Other Topics Concern   Not on file  Social History Narrative   Not on file   Social Determinants of Health   Financial Resource Strain: Not on file  Food Insecurity: Not on file  Transportation Needs: Not on file  Physical Activity: Not on file  Stress: Not on file  Social Connections: Not on file     Family History: The patient's family history includes Heart attack in his father; Heart disease in his mother; Vascular Disease in his sister.  ROS:   Please see the history of present illness.     All other systems reviewed and are negative.  EKGs/Labs/Other Studies Reviewed:    The following studies were reviewed today:  Cath 06/26/2017 Prox RCA to Dist RCA lesion, 100 %stenosed. Prox LAD to Mid LAD lesion, 70 %stenosed. Dist LAD lesion, 50 %stenosed. 1st Diag lesion, 100 %stenosed. Ost Ramus lesion, 100 %stenosed. 2nd Mrg lesion, 99 %stenosed. Mid Cx to Dist Cx lesion, 60 %stenosed. SVG to RCA- Origin lesion, 100 %stenosed. SVG sequential to ramus intermediate and OM is widely patent. SVG to diagonal is patent. LIMA and is normal in caliber. LV end diastolic pressure is normal.   1. Severe 3 vessel obstructive CAD 2. Patent LIMA to the LAD 3. Patent SVG to ramus and OM branches sequentially. Stent is widely patent 4. Patent SVG to diagonal 5. SVG to RCA known to be occluded. 6. Normal LVEDP   Plan: no new disease to explain symptoms. Recommend  continued medical therapy.  Carotid doppler 08/02/21: Summary:  Right Carotid: Velocities in the right ICA are consistent with a 1-39%  stenosis.                 Hemodynamically significant plaque >50% visualized in the  CCA.                 The ECA appears >50% stenosed. Stable RICA velocities.   Left Carotid: Velocities in the left ICA are consistent with a 40-59%  stenosis.                Non-hemodynamically significant plaque <50% noted in the  CCA.  Stable LICA velocities.   Vertebrals:  Left vertebral artery demonstrates antegrade flow. Atypical               antegrade flow in the right vertebral artery.  Subclavians: Bilateral subclavian artery flow was disturbed.   *See table(s) above for measurements and observations.  Suggest follow up study in 12 months.    Electronically signed by Carlyle Dolly MD on 08/02/2021 at 5:28:48 PM.      EKG:  EKG is not ordered today.    Recent Labs: 09/28/2021: ALT 17; BUN 21; Creatinine, Ser 1.28; Hemoglobin 11.3; Platelets 211; Potassium 4.9; Sodium 143  Recent Lipid Panel    Component Value Date/Time   CHOL 121 09/28/2021 1143   TRIG 209 (H) 09/28/2021 1143   HDL 33 (L) 09/28/2021 1143   CHOLHDL 3.7 09/28/2021 1143   CHOLHDL 4.5 07/20/2016 0950   VLDL 47 (H) 07/20/2016 0950   LDLCALC 54 09/28/2021 1143   LDLDIRECT 76.0 02/28/2015 0939   Dated 01/22/22: A1c 7.3%. BUN 22, creatinine 1.37. cholesterol 123, triglycerides 233, HDL 34, LDL 52. Otherwise CMET normal.       Physical Exam:    VS:  There were no vitals taken for this visit.    Wt Readings from Last 3 Encounters:  10/05/21 206 lb 3.2 oz (93.5 kg)  06/23/21 192 lb 0.3 oz (87.1 kg)  06/14/21 192 lb (87.1 kg)     GEN:  Well nourished, well developed in no acute distress HEENT: Normal NECK: No JVD; No carotid bruits LYMPHATICS: No lymphadenopathy CARDIAC: RRR, no murmurs, rubs, gallops RESPIRATORY:  Clear to auscultation without rales, wheezing or  rhonchi  ABDOMEN: Soft, non-tender, non-distended MUSCULOSKELETAL:  No edema; No deformity  SKIN: Warm and dry NEUROLOGIC:  Alert and oriented x 3 PSYCHIATRIC:  Normal affect   ASSESSMENT:    No diagnosis found.   PLAN:    In order of problems listed above:  CAD: Denies any recent chest pain.  On aspirin, amlodipine, and carvedilol. Extensive evaluation in 2019 with cath and CPX. Continue current therapy.  Hypertension: Blood pressure is well controlled.   Hyperlipidemia: Continue Crestor. LDL 54 is at goal.  DM2: Managed by primary care provider. A1c improved to 7.1%.   Hypothyroidism on levothyroxine.    Overall appears stable from a CV standpoint. We will plan on follow up in 6 months.  Medication Adjustments/Labs and Tests Ordered: Current medicines are reviewed at length with the patient today.  Concerns regarding medicines are outlined above.  No orders of the defined types were placed in this encounter.   No orders of the defined types were placed in this encounter.    There are no Patient Instructions on file for this visit.   Signed, Deziya Amero Martinique, MD  05/22/2022 5:44 PM    Paul [name]

## 2022-05-28 ENCOUNTER — Telehealth: Payer: Self-pay | Admitting: Cardiology

## 2022-05-28 ENCOUNTER — Ambulatory Visit: Payer: PPO | Admitting: Cardiology

## 2022-05-28 DIAGNOSIS — N393 Stress incontinence (female) (male): Secondary | ICD-10-CM | POA: Diagnosis not present

## 2022-05-28 DIAGNOSIS — C61 Malignant neoplasm of prostate: Secondary | ICD-10-CM | POA: Diagnosis not present

## 2022-05-28 NOTE — Telephone Encounter (Signed)
Patient states that he was not sure if he had to come in today for an appointment as he did not remember it or put it on his schedule. According to chart review patient needs 6 month follow up. Patient would like to speak with Paul [name] and see if he does need to come in. Advised I would forward message to her. Patient verbalized understanding.

## 2022-05-28 NOTE — Telephone Encounter (Signed)
Spoke to patient he stated he was unaware of his appointment with Dr.Jordan today.Appointment rescheduled to 9/1 at 10:40 am.

## 2022-05-28 NOTE — Telephone Encounter (Signed)
Patient is calling cancelling his 6 month f/u scheduled for today, 05/28/22 with Dr. Martinique stating he was not aware of the appt and it caught him by surprise. Patient is wanting to speak with Malachy Mood regarding necessity of appt prior to rescheduling. Please advise.

## 2022-05-31 DIAGNOSIS — Z1389 Encounter for screening for other disorder: Secondary | ICD-10-CM | POA: Diagnosis not present

## 2022-05-31 DIAGNOSIS — Z0000 Encounter for general adult medical examination without abnormal findings: Secondary | ICD-10-CM | POA: Diagnosis not present

## 2022-06-21 NOTE — Progress Notes (Signed)
Cardiology Office Note:    Date:  06/29/2022   ID:  Paul [name], DOB [birth year], MRN [id number]  PCP:  [name], MD   [facility] Providers Cardiologist:  Rollen Selders Martinique, MD     Referring MD: [name], MD   Chief Complaint  Patient presents with   Follow-up   Headache   Shortness of Breath     History of Present Illness:    Paul [name] is a 75 y.o. male with a hx of CAD s/p CABG 2005, carotid artery disease, HTN, HLD, DM II, hypothyroidism and OSA.  Myoview obtained in November 2016 showed inferolateral infarct with peri-infarct ischemia.  Subsequent cardiac catheterization showed severe disease in SVG to OM 2 treated with stent, occluded SVG to PDA and distal RCA filled with left-to-right collaterals.  He had shortness of breath with Brilinta, after switching to Plavix, shortness of breath resolved.  He has persistent exertional fatigue.  Repeat cardiac catheterization in August 2018 showed patent grafts except for known occluded SVG to RCA, no new disease to explain his symptoms.  Carvedilol was reduced due to persistent symptoms of dizziness and fatigue.  Initially carvedilol was tapered off however this did not simply alleviate his symptoms, and was resumed.    Last carotid dopplers in October showed no change.  On follow up today he is doing OK. Notes he is tired all the time. Thinks the heat takes a lot out of him. Has infrequent angina if he moves too quickly. Hasn't really been doing any regular exercise.     Past Medical History:  Diagnosis Date   Allergy    Anxiety    Arthritis    Complication of anesthesia    slow to wake up   Coronary artery disease    a. s/p CABG;  b. 08/2015 MV: inf/inflat infarct w/ new peri-infarct isch; c. LHC 11/16: pLAD 70, dLAD 50, D1 100, oRI 100, mLCx 60, OM2 99, pRCA 99 (L>R collats), S-RPDA 100, S-D1 ok, S-RI/OM2 mid 85 (PCI: Synergy DES), L-LAD ok, EF 55-65%.   Depression    Dyspnea    due to meds pt on per pt    GERD (gastroesophageal reflux disease)    History of kidney stones    Hyperlipidemia    Hypertension    Hypertensive heart disease    Hypothyroidism    Insomnia    Myocardial infarction (HCC)    OSA (obstructive sleep apnea)    CPAP   Prostate cancer ([place])    Type II diabetes mellitus ([place])     Past Surgical History:  Procedure Laterality Date   CARDIAC CATHETERIZATION N/A 09/19/2015   Procedure: Coronary Stent Intervention;  Surgeon: Amera Banos M Martinique, MD;  Location: [facility];  Service: Cardiovascular;  Laterality: N/A;   CARDIAC CATHETERIZATION  09/19/2015   Procedure: Left Heart Cath and Cors/Grafts Angiography;  Surgeon: Yobani Schertzer M Martinique, MD;  Location: [facility];  Service: Cardiovascular;;   CHOLECYSTECTOMY     CORONARY ARTERY BYPASS GRAFT     JOINT REPLACEMENT     right knee   LEFT HEART CATH AND CORS/GRAFTS ANGIOGRAPHY N/A 06/26/2017   Procedure: LEFT HEART CATH AND CORS/GRAFTS ANGIOGRAPHY;  Surgeon: Martinique, Malichi Palardy M, MD;  Location: [facility];  Service: Cardiovascular;  Laterality: N/A;   left knee replacement      PROSTATE SURGERY     REVERSE SHOULDER ARTHROPLASTY Right 06/23/2021   Procedure: REVERSE SHOULDER ARTHROPLASTY;  Surgeon: [name], MD;  Location: [facility];  Service: Orthopedics;  Laterality: Right;  with ISB   right carpal tunnel       Current Medications: Current Meds  Medication Sig   acetaminophen (TYLENOL) 650 MG CR tablet Take 1,300 mg by mouth every 8 (eight) hours as needed for pain.   aspirin EC 81 MG tablet Take 81 mg by mouth daily.   carvedilol (COREG) 12.5 MG tablet Take 1 tablet by mouth twice daily   Cholecalciferol (VITAMIN D) 2000 units tablet Take 2,000 Units by mouth daily.   clindamycin (CLEOCIN) 150 MG capsule Take 300 mg by mouth See admin instructions. Take 300 mg 1 hour before dental procedure.   colestipol (COLESTID) 1 G tablet Take 1 g by mouth at bedtime.    Continuous Blood Gluc Receiver (FREESTYLE  LIBRE 2 READER) DEVI See admin instructions.   Continuous Blood Gluc Sensor (FREESTYLE LIBRE 2 SENSOR) MISC USE AS DIRECTED TO obtain BLOOD GLUCOSE FOUR TIMES DAILY   Continuous Blood Gluc Sensor (FREESTYLE LIBRE 2 SENSOR) MISC USE AS DIRECTED TO OBTAIN BLOOD GLUCOSE FOUR TIMES DAILY   DULoxetine (CYMBALTA) 60 MG capsule Take 60 mg by mouth daily.   empagliflozin (JARDIANCE) 25 MG TABS tablet Take by mouth daily.   EPINEPHrine 0.3 mg/0.3 mL IJ SOAJ injection Inject 0.3 mg into the muscle daily as needed for anaphylaxis.   fluticasone (FLONASE) 50 MCG/ACT nasal spray Place 2 sprays into both nostrils daily as needed for allergies or rhinitis.   glipiZIDE (GLUCOTROL) 5 MG tablet Take 5 mg by mouth 2 (two) times daily.   guaiFENesin (MUCINEX) 600 MG 12 hr tablet Take 600-1,200 mg by mouth 2 (two) times daily as needed (for sinus drainage/cough/congestion).   loperamide (IMODIUM A-D) 2 MG tablet Take 2-4 mg by mouth 4 (four) times daily as needed for diarrhea or loose stools.   losartan (COZAAR) 100 MG tablet TAKE 1 TABLET BY MOUTH ONCE DAILY.   metFORMIN (GLUCOPHAGE) 1000 MG tablet Take 1 tablet (1,000 mg total) by mouth 2 (two) times daily.   Multiple Vitamin (MULTIVITAMIN) tablet Take 1 tablet by mouth daily.   nitroGLYCERIN (NITROSTAT) 0.4 MG SL tablet Place 1 tablet (0.4 mg total) under the tongue every 5 (five) minutes as needed for chest pain.   Omega-3 Fatty Acids (FISH OIL) 1000 MG CAPS Take 1,000 mg by mouth in the morning and at bedtime.   pantoprazole (PROTONIX) 20 MG tablet Take 1 tablet (20 mg total) by mouth daily.   potassium citrate (UROCIT-K) 10 MEQ (1080 MG) SR tablet Take 10 mEq by mouth 2 (two) times daily.    rosuvastatin (CRESTOR) 10 MG tablet Take 1 tablet by mouth once daily   traZODone (DESYREL) 50 MG tablet Take 50 mg by mouth at bedtime.   triamcinolone cream (KENALOG) 0.1 % Apply 1 application topically daily as needed (psoriasis).   vitamin B-12 (CYANOCOBALAMIN) 250 MCG  tablet Take 250 mcg by mouth daily.   [DISCONTINUED] levothyroxine (SYNTHROID) 88 MCG tablet Take 88 mcg by mouth every morning.     Allergies:   Brilinta [ticagrelor], Yellow jacket venom, Morphine, Penicillins, Percocet [oxycodone-acetaminophen], Vicodin [hydrocodone-acetaminophen], Alprazolam, and Septra ds [sulfamethoxazole-trimethoprim]   Social History   Socioeconomic History   Marital status: Married    Spouse name: Not on file   Number of children: Not on file   Years of education: Not on file   Highest education level: Not on file  Occupational History   Not on file  Tobacco Use   Smoking  status: Never   Smokeless tobacco: Never  Vaping Use   Vaping Use: Never used  Substance and Sexual Activity   Alcohol use: No    Alcohol/week: 0.0 standard drinks of alcohol   Drug use: No   Sexual activity: Not on file  Other Topics Concern   Not on file  Social History Narrative   Not on file   Social Determinants of Health   Financial Resource Strain: Not on file  Food Insecurity: Not on file  Transportation Needs: Not on file  Physical Activity: Not on file  Stress: Not on file  Social Connections: Not on file     Family History: The patient's family history includes Heart attack in his father; Heart disease in his mother; Vascular Disease in his sister.  ROS:   Please see the history of present illness.     All other systems reviewed and are negative.  EKGs/Labs/Other Studies Reviewed:    The following studies were reviewed today:  Cath 06/26/2017 Prox RCA to Dist RCA lesion, 100 %stenosed. Prox LAD to Mid LAD lesion, 70 %stenosed. Dist LAD lesion, 50 %stenosed. 1st Diag lesion, 100 %stenosed. Ost Ramus lesion, 100 %stenosed. 2nd Mrg lesion, 99 %stenosed. Mid Cx to Dist Cx lesion, 60 %stenosed. SVG to RCA- Origin lesion, 100 %stenosed. SVG sequential to ramus intermediate and OM is widely patent. SVG to diagonal is patent. LIMA and is normal in  caliber. LV end diastolic pressure is normal.   1. Severe 3 vessel obstructive CAD 2. Patent LIMA to the LAD 3. Patent SVG to ramus and OM branches sequentially. Stent is widely patent 4. Patent SVG to diagonal 5. SVG to RCA known to be occluded. 6. Normal LVEDP   Plan: no new disease to explain symptoms. Recommend continued medical therapy.  Carotid doppler 08/02/21: Summary:  Right Carotid: Velocities in the right ICA are consistent with a 1-39%  stenosis.                 Hemodynamically significant plaque >50% visualized in the  CCA.                 The ECA appears >50% stenosed. Stable RICA velocities.   Left Carotid: Velocities in the left ICA are consistent with a 40-59%  stenosis.                Non-hemodynamically significant plaque <50% noted in the  CCA.                Stable LICA velocities.   Vertebrals:  Left vertebral artery demonstrates antegrade flow. Atypical               antegrade flow in the right vertebral artery.  Subclavians: Bilateral subclavian artery flow was disturbed.   *See table(s) above for measurements and observations.  Suggest follow up study in 12 months.    Electronically signed by Carlyle Dolly MD on 08/02/2021 at 5:28:48 PM.      EKG:  EKG is ordered today.  NSR rate 69 with sinus arrhythmia. Nonspecific TWA. No acute change. I have personally reviewed and interpreted this study.   Recent Labs: 09/28/2021: ALT 17; BUN 21; Creatinine, Ser 1.28; Hemoglobin 11.3; Platelets 211; Potassium 4.9; Sodium 143  Recent Lipid Panel    Component Value Date/Time   CHOL 121 09/28/2021 1143   TRIG 209 (H) 09/28/2021 1143   HDL 33 (L) 09/28/2021 1143   CHOLHDL 3.7 09/28/2021 1143   CHOLHDL 4.5 07/20/2016 0950  VLDL 47 (H) 07/20/2016 0950   LDLCALC 54 09/28/2021 1143   LDLDIRECT 76.0 02/28/2015 0939   Dated 01/22/22: A1c 7.3%. BUN 22, creatinine 1.37. cholesterol 123, triglycerides 233, HDL 34, LDL 52. Otherwise CMET normal.       Physical  Exam:    VS:  BP (!) 112/56 (BP Location: Left Arm, Patient Position: Sitting, Cuff Size: Normal)   Pulse 72   Ht '5\' 9"'$  (1.753 m)   Wt 201 lb (91.2 kg)   BMI 29.68 kg/m     Wt Readings from Last 3 Encounters:  06/29/22 201 lb (91.2 kg)  10/05/21 206 lb 3.2 oz (93.5 kg)  06/23/21 192 lb 0.3 oz (87.1 kg)     GEN:  Well nourished, well developed in no acute distress HEENT: Normal NECK: No JVD; No carotid bruits LYMPHATICS: No lymphadenopathy CARDIAC: RRR, no murmurs, rubs, gallops RESPIRATORY:  Clear to auscultation without rales, wheezing or rhonchi  ABDOMEN: Soft, non-tender, non-distended MUSCULOSKELETAL:  No edema; No deformity  SKIN: Warm and dry NEUROLOGIC:  Alert and oriented x 3 PSYCHIATRIC:  Normal affect   ASSESSMENT:    1. Coronary artery disease involving coronary bypass graft of native heart without angina pectoris   2. Bilateral carotid artery disease, unspecified type ([place])   3. Essential hypertension   4. Mixed hyperlipidemia      PLAN:    In order of problems listed above:  CAD: he has stable class 1-2 angina. No change.  On aspirin, amlodipine, and carvedilol. Extensive evaluation in 2019 with cath and CPX. Continue current therapy.  Hypertension: Blood pressure is well controlled.   Hyperlipidemia: Continue Crestor. LDL 52 is at goal.  DM2: Managed by primary care provider. A1c improved to 7.3%.   Hypothyroidism on levothyroxine.    Overall appears stable from a CV standpoint. Encouraged him to increase his aerobic activity.  We will plan on follow up in 6 months. Check carotid dopplers in October.   Medication Adjustments/Labs and Tests Ordered: Current medicines are reviewed at length with the patient today.  Concerns regarding medicines are outlined above.  No orders of the defined types were placed in this encounter.   No orders of the defined types were placed in this encounter.    There are no Patient Instructions on file for this  visit.   Signed, Calypso Hagarty Martinique, MD  06/29/2022 10:50 AM    [place]

## 2022-06-29 ENCOUNTER — Encounter: Payer: Self-pay | Admitting: Cardiology

## 2022-06-29 ENCOUNTER — Ambulatory Visit: Payer: PPO | Attending: Cardiology | Admitting: Cardiology

## 2022-06-29 VITALS — BP 112/56 | HR 72 | Ht 69.0 in | Wt 201.0 lb

## 2022-06-29 DIAGNOSIS — I25810 Atherosclerosis of coronary artery bypass graft(s) without angina pectoris: Secondary | ICD-10-CM | POA: Diagnosis not present

## 2022-06-29 DIAGNOSIS — I10 Essential (primary) hypertension: Secondary | ICD-10-CM

## 2022-06-29 DIAGNOSIS — I779 Disorder of arteries and arterioles, unspecified: Secondary | ICD-10-CM

## 2022-06-29 DIAGNOSIS — E782 Mixed hyperlipidemia: Secondary | ICD-10-CM

## 2022-06-29 NOTE — Patient Instructions (Signed)
Medication Instructions:  Continue same medications *If you need a refill on your cardiac medications before your next appointment, please call your pharmacy*   Lab Work: None ordered   Testing/Procedures: Schedule Carotid dopplers in Oct    Follow-Up: At [facility], you and your health needs are our priority.  As part of our continuing mission to provide you with exceptional heart care, we have created designated Provider Care Teams.  These Care Teams include your primary Cardiologist (physician) and Advanced Practice Providers (APPs -  Physician Assistants and Nurse Practitioners) who all work together to provide you with the care you need, when you need it.  We recommend signing up for the patient portal called "MyChart".  Sign up information is provided on this After Visit Summary.  MyChart is used to connect with patients for Virtual Visits (Telemedicine).  Patients are able to view lab/test results, encounter notes, upcoming appointments, etc.  Non-urgent messages can be sent to your provider as well.   To learn more about what you can do with MyChart, go to [email or web address].    Your next appointment:  6 months    Call in Dec to schedule March appointment     The format for your next appointment: Office    Provider:  Dr.Jordan  Important Information About [place]

## 2022-07-05 DIAGNOSIS — N183 Chronic kidney disease, stage 3 unspecified: Secondary | ICD-10-CM | POA: Diagnosis not present

## 2022-07-05 DIAGNOSIS — E1122 Type 2 diabetes mellitus with diabetic chronic kidney disease: Secondary | ICD-10-CM | POA: Diagnosis not present

## 2022-07-05 DIAGNOSIS — Z7984 Long term (current) use of oral hypoglycemic drugs: Secondary | ICD-10-CM | POA: Diagnosis not present

## 2022-07-18 DIAGNOSIS — M545 Low back pain, unspecified: Secondary | ICD-10-CM | POA: Diagnosis not present

## 2022-07-19 DIAGNOSIS — E1122 Type 2 diabetes mellitus with diabetic chronic kidney disease: Secondary | ICD-10-CM | POA: Diagnosis not present

## 2022-07-19 DIAGNOSIS — R809 Proteinuria, unspecified: Secondary | ICD-10-CM | POA: Diagnosis not present

## 2022-07-19 DIAGNOSIS — N1831 Chronic kidney disease, stage 3a: Secondary | ICD-10-CM | POA: Diagnosis not present

## 2022-07-19 DIAGNOSIS — E039 Hypothyroidism, unspecified: Secondary | ICD-10-CM | POA: Diagnosis not present

## 2022-07-25 DIAGNOSIS — G4733 Obstructive sleep apnea (adult) (pediatric): Secondary | ICD-10-CM | POA: Diagnosis not present

## 2022-07-26 ENCOUNTER — Other Ambulatory Visit: Payer: Self-pay | Admitting: Cardiology

## 2022-07-26 DIAGNOSIS — I779 Disorder of arteries and arterioles, unspecified: Secondary | ICD-10-CM

## 2022-07-30 DIAGNOSIS — H35363 Drusen (degenerative) of macula, bilateral: Secondary | ICD-10-CM | POA: Diagnosis not present

## 2022-07-30 DIAGNOSIS — H4312 Vitreous hemorrhage, left eye: Secondary | ICD-10-CM | POA: Diagnosis not present

## 2022-07-30 DIAGNOSIS — H43812 Vitreous degeneration, left eye: Secondary | ICD-10-CM | POA: Diagnosis not present

## 2022-07-30 DIAGNOSIS — H3562 Retinal hemorrhage, left eye: Secondary | ICD-10-CM | POA: Diagnosis not present

## 2022-07-30 DIAGNOSIS — Z961 Presence of intraocular lens: Secondary | ICD-10-CM | POA: Diagnosis not present

## 2022-07-30 DIAGNOSIS — H33322 Round hole, left eye: Secondary | ICD-10-CM | POA: Diagnosis not present

## 2022-08-02 DIAGNOSIS — H33322 Round hole, left eye: Secondary | ICD-10-CM | POA: Diagnosis not present

## 2022-08-07 ENCOUNTER — Ambulatory Visit (HOSPITAL_COMMUNITY): Payer: PPO

## 2022-08-23 DIAGNOSIS — H43812 Vitreous degeneration, left eye: Secondary | ICD-10-CM | POA: Diagnosis not present

## 2022-08-23 DIAGNOSIS — H31092 Other chorioretinal scars, left eye: Secondary | ICD-10-CM | POA: Diagnosis not present

## 2022-08-23 DIAGNOSIS — Z961 Presence of intraocular lens: Secondary | ICD-10-CM | POA: Diagnosis not present

## 2022-08-23 DIAGNOSIS — H43392 Other vitreous opacities, left eye: Secondary | ICD-10-CM | POA: Diagnosis not present

## 2022-08-23 DIAGNOSIS — H4312 Vitreous hemorrhage, left eye: Secondary | ICD-10-CM | POA: Diagnosis not present

## 2022-08-29 ENCOUNTER — Ambulatory Visit (HOSPITAL_COMMUNITY)
Admission: RE | Admit: 2022-08-29 | Discharge: 2022-08-29 | Disposition: A | Discharge status: Home / Self Care | Payer: PPO | Source: Ambulatory Visit | Attending: Cardiology | Admitting: Cardiology

## 2022-08-29 DIAGNOSIS — I779 Disorder of arteries and arterioles, unspecified: Secondary | ICD-10-CM

## 2022-08-29 DIAGNOSIS — I6523 Occlusion and stenosis of bilateral carotid arteries: Secondary | ICD-10-CM | POA: Diagnosis not present

## 2022-08-31 ENCOUNTER — Other Ambulatory Visit: Payer: Self-pay

## 2022-09-07 DIAGNOSIS — Z6828 Body mass index (BMI) 28.0-28.9, adult: Secondary | ICD-10-CM | POA: Diagnosis not present

## 2022-09-07 DIAGNOSIS — R051 Acute cough: Secondary | ICD-10-CM | POA: Diagnosis not present

## 2022-09-07 DIAGNOSIS — J209 Acute bronchitis, unspecified: Secondary | ICD-10-CM | POA: Diagnosis not present

## 2022-09-07 DIAGNOSIS — Z03818 Encounter for observation for suspected exposure to other biological agents ruled out: Secondary | ICD-10-CM | POA: Diagnosis not present

## 2022-09-07 DIAGNOSIS — R0981 Nasal congestion: Secondary | ICD-10-CM | POA: Diagnosis not present

## 2022-09-14 DIAGNOSIS — M545 Low back pain, unspecified: Secondary | ICD-10-CM | POA: Diagnosis not present

## 2022-09-14 DIAGNOSIS — E039 Hypothyroidism, unspecified: Secondary | ICD-10-CM | POA: Diagnosis not present

## 2022-09-14 DIAGNOSIS — H33312 Horseshoe tear of retina without detachment, left eye: Secondary | ICD-10-CM | POA: Diagnosis not present

## 2022-09-14 DIAGNOSIS — E1122 Type 2 diabetes mellitus with diabetic chronic kidney disease: Secondary | ICD-10-CM | POA: Diagnosis not present

## 2022-09-14 DIAGNOSIS — I10 Essential (primary) hypertension: Secondary | ICD-10-CM | POA: Diagnosis not present

## 2022-09-14 DIAGNOSIS — K219 Gastro-esophageal reflux disease without esophagitis: Secondary | ICD-10-CM | POA: Diagnosis not present

## 2022-09-14 DIAGNOSIS — E785 Hyperlipidemia, unspecified: Secondary | ICD-10-CM | POA: Diagnosis not present

## 2022-09-14 DIAGNOSIS — J209 Acute bronchitis, unspecified: Secondary | ICD-10-CM | POA: Diagnosis not present

## 2022-09-14 DIAGNOSIS — F3342 Major depressive disorder, recurrent, in full remission: Secondary | ICD-10-CM | POA: Diagnosis not present

## 2022-09-14 DIAGNOSIS — R809 Proteinuria, unspecified: Secondary | ICD-10-CM | POA: Diagnosis not present

## 2022-09-14 DIAGNOSIS — G4733 Obstructive sleep apnea (adult) (pediatric): Secondary | ICD-10-CM | POA: Diagnosis not present

## 2022-09-14 DIAGNOSIS — I25118 Atherosclerotic heart disease of native coronary artery with other forms of angina pectoris: Secondary | ICD-10-CM | POA: Diagnosis not present

## 2022-10-02 DIAGNOSIS — H33322 Round hole, left eye: Secondary | ICD-10-CM | POA: Diagnosis not present

## 2022-10-02 DIAGNOSIS — H43392 Other vitreous opacities, left eye: Secondary | ICD-10-CM | POA: Diagnosis not present

## 2022-10-10 DIAGNOSIS — C61 Malignant neoplasm of prostate: Secondary | ICD-10-CM | POA: Diagnosis not present

## 2022-10-12 DIAGNOSIS — C61 Malignant neoplasm of prostate: Secondary | ICD-10-CM | POA: Diagnosis not present

## 2022-10-29 HISTORY — PX: EYE SURGERY: SHX253

## 2022-12-17 DIAGNOSIS — M25562 Pain in left knee: Secondary | ICD-10-CM | POA: Diagnosis not present

## 2022-12-17 DIAGNOSIS — M545 Low back pain, unspecified: Secondary | ICD-10-CM | POA: Diagnosis not present

## 2022-12-19 DIAGNOSIS — E119 Type 2 diabetes mellitus without complications: Secondary | ICD-10-CM | POA: Diagnosis not present

## 2022-12-19 DIAGNOSIS — Z961 Presence of intraocular lens: Secondary | ICD-10-CM | POA: Diagnosis not present

## 2022-12-19 DIAGNOSIS — H59812 Chorioretinal scars after surgery for detachment, left eye: Secondary | ICD-10-CM | POA: Diagnosis not present

## 2022-12-19 DIAGNOSIS — H43391 Other vitreous opacities, right eye: Secondary | ICD-10-CM | POA: Diagnosis not present

## 2022-12-19 DIAGNOSIS — Z9849 Cataract extraction status, unspecified eye: Secondary | ICD-10-CM | POA: Diagnosis not present

## 2022-12-22 DIAGNOSIS — J189 Pneumonia, unspecified organism: Secondary | ICD-10-CM | POA: Diagnosis not present

## 2023-01-09 DIAGNOSIS — M5459 Other low back pain: Secondary | ICD-10-CM | POA: Diagnosis not present

## 2023-01-14 DIAGNOSIS — M5459 Other low back pain: Secondary | ICD-10-CM | POA: Diagnosis not present

## 2023-02-13 DIAGNOSIS — H35363 Drusen (degenerative) of macula, bilateral: Secondary | ICD-10-CM | POA: Diagnosis not present

## 2023-02-13 DIAGNOSIS — H53142 Visual discomfort, left eye: Secondary | ICD-10-CM | POA: Diagnosis not present

## 2023-02-13 DIAGNOSIS — Z961 Presence of intraocular lens: Secondary | ICD-10-CM | POA: Diagnosis not present

## 2023-02-13 DIAGNOSIS — H59812 Chorioretinal scars after surgery for detachment, left eye: Secondary | ICD-10-CM | POA: Diagnosis not present

## 2023-02-18 DIAGNOSIS — M25512 Pain in left shoulder: Secondary | ICD-10-CM | POA: Diagnosis not present

## 2023-02-26 DIAGNOSIS — M5416 Radiculopathy, lumbar region: Secondary | ICD-10-CM | POA: Diagnosis not present

## 2023-03-12 DIAGNOSIS — M25562 Pain in left knee: Secondary | ICD-10-CM | POA: Diagnosis not present

## 2023-03-12 DIAGNOSIS — M5416 Radiculopathy, lumbar region: Secondary | ICD-10-CM | POA: Diagnosis not present

## 2023-03-15 DIAGNOSIS — E1122 Type 2 diabetes mellitus with diabetic chronic kidney disease: Secondary | ICD-10-CM | POA: Diagnosis not present

## 2023-03-15 DIAGNOSIS — E039 Hypothyroidism, unspecified: Secondary | ICD-10-CM | POA: Diagnosis not present

## 2023-03-15 DIAGNOSIS — I10 Essential (primary) hypertension: Secondary | ICD-10-CM | POA: Diagnosis not present

## 2023-03-18 DIAGNOSIS — E039 Hypothyroidism, unspecified: Secondary | ICD-10-CM | POA: Diagnosis not present

## 2023-03-18 DIAGNOSIS — E785 Hyperlipidemia, unspecified: Secondary | ICD-10-CM | POA: Diagnosis not present

## 2023-03-18 DIAGNOSIS — E1122 Type 2 diabetes mellitus with diabetic chronic kidney disease: Secondary | ICD-10-CM | POA: Diagnosis not present

## 2023-03-18 DIAGNOSIS — I10 Essential (primary) hypertension: Secondary | ICD-10-CM | POA: Diagnosis not present

## 2023-03-18 DIAGNOSIS — R809 Proteinuria, unspecified: Secondary | ICD-10-CM | POA: Diagnosis not present

## 2023-03-18 DIAGNOSIS — M4807 Spinal stenosis, lumbosacral region: Secondary | ICD-10-CM | POA: Diagnosis not present

## 2023-03-18 DIAGNOSIS — C61 Malignant neoplasm of prostate: Secondary | ICD-10-CM | POA: Diagnosis not present

## 2023-03-18 DIAGNOSIS — N1831 Chronic kidney disease, stage 3a: Secondary | ICD-10-CM | POA: Diagnosis not present

## 2023-03-18 DIAGNOSIS — I779 Disorder of arteries and arterioles, unspecified: Secondary | ICD-10-CM | POA: Diagnosis not present

## 2023-03-18 DIAGNOSIS — F331 Major depressive disorder, recurrent, moderate: Secondary | ICD-10-CM | POA: Diagnosis not present

## 2023-03-18 DIAGNOSIS — E1165 Type 2 diabetes mellitus with hyperglycemia: Secondary | ICD-10-CM | POA: Diagnosis not present

## 2023-03-18 DIAGNOSIS — I25118 Atherosclerotic heart disease of native coronary artery with other forms of angina pectoris: Secondary | ICD-10-CM | POA: Diagnosis not present

## 2023-03-30 DIAGNOSIS — J189 Pneumonia, unspecified organism: Secondary | ICD-10-CM

## 2023-03-30 HISTORY — DX: Pneumonia, unspecified organism: J18.9

## 2023-04-12 DIAGNOSIS — C61 Malignant neoplasm of prostate: Secondary | ICD-10-CM | POA: Diagnosis not present

## 2023-04-19 DIAGNOSIS — N393 Stress incontinence (female) (male): Secondary | ICD-10-CM | POA: Diagnosis not present

## 2023-04-19 DIAGNOSIS — C61 Malignant neoplasm of prostate: Secondary | ICD-10-CM | POA: Diagnosis not present

## 2023-04-23 DIAGNOSIS — M25512 Pain in left shoulder: Secondary | ICD-10-CM | POA: Diagnosis not present

## 2023-04-27 DIAGNOSIS — M25512 Pain in left shoulder: Secondary | ICD-10-CM | POA: Diagnosis not present

## 2023-05-06 DIAGNOSIS — M25512 Pain in left shoulder: Secondary | ICD-10-CM | POA: Diagnosis not present

## 2023-05-15 DIAGNOSIS — Z6828 Body mass index (BMI) 28.0-28.9, adult: Secondary | ICD-10-CM | POA: Diagnosis not present

## 2023-05-15 DIAGNOSIS — M48061 Spinal stenosis, lumbar region without neurogenic claudication: Secondary | ICD-10-CM | POA: Diagnosis not present

## 2023-05-20 ENCOUNTER — Telehealth: Payer: Self-pay | Admitting: Cardiology

## 2023-05-20 DIAGNOSIS — I25810 Atherosclerosis of coronary artery bypass graft(s) without angina pectoris: Secondary | ICD-10-CM

## 2023-05-20 DIAGNOSIS — Z79899 Other long term (current) drug therapy: Secondary | ICD-10-CM

## 2023-05-20 DIAGNOSIS — E119 Type 2 diabetes mellitus without complications: Secondary | ICD-10-CM

## 2023-05-20 DIAGNOSIS — E782 Mixed hyperlipidemia: Secondary | ICD-10-CM

## 2023-05-20 NOTE — Telephone Encounter (Signed)
Patient would like to know if you would like any labs drawn  before his appointment on 7/25

## 2023-05-20 NOTE — Telephone Encounter (Signed)
Patient calling to see if had labs due advise patient there is none showing at this time. Would like the nurse to verify it. Please advise

## 2023-05-21 NOTE — Telephone Encounter (Signed)
Left voicemail for patient to return call to office. 

## 2023-05-21 NOTE — Progress Notes (Unsigned)
Cardiology Office Note:    Date:  05/23/2023   ID:  Paul [name], DOB Mar 24, 1946, MRN [id number]  PCP:  Gweneth Dimitri, MD   [facility] Providers Cardiologist:  Esteban Kobashigawa [place], MD     Referring MD: Gweneth Dimitri, MD   Chief Complaint  Patient presents with   Coronary Artery Disease     History of Present Illness:    Paul [name] is a 77 y.o. male with a hx of CAD s/p CABG 2005, carotid artery disease, HTN, HLD, DM II, hypothyroidism and OSA.  Myoview obtained in November 2016 showed inferolateral infarct with peri-infarct ischemia.  Subsequent cardiac catheterization showed severe disease in SVG to OM 2 treated with stent, occluded SVG to PDA and distal RCA filled with left-to-right collaterals.  He had shortness of breath with Brilinta, after switching to Plavix, shortness of breath resolved.  He has persistent exertional fatigue.  Repeat cardiac catheterization in August 2018 showed patent grafts except for known occluded SVG to RCA, no new disease to explain his symptoms.  Carvedilol was reduced due to persistent symptoms of dizziness and fatigue. CPX and Echo in 2019 were good.   Last carotid dopplers in November showed no change.  On follow up today he is having a lot of back problems with radiculopathy. Did get relief with subdural injection but only lasted 5 weeks. Has seen Dr Danielle Dess and states will need lumbar surgery. He also has chronic hip and shoulder issues. His activity has been very limited. Uses crutches at home. Notes he has about 6 episodes of chest pain weekly usually relieved with deep breathing. Hasn't had to use Ntg. Notes mild swelling in left leg.     Past Medical History:  Diagnosis Date   Allergy    Anxiety    Arthritis    Complication of anesthesia    slow to wake up   Coronary artery disease    a. s/p CABG;  b. 08/2015 MV: inf/inflat infarct w/ new peri-infarct isch; c. LHC 11/16: pLAD 70, dLAD 50, D1 100, oRI 100, mLCx 60, OM2 99, pRCA 99  (L>R collats), S-RPDA 100, S-D1 ok, S-RI/OM2 mid 85 (PCI: Synergy DES), L-LAD ok, EF 55-65%.   Depression    Dyspnea    due to meds pt on per pt   GERD (gastroesophageal reflux disease)    History of kidney stones    Hyperlipidemia    Hypertension    Hypertensive heart disease    Hypothyroidism    Insomnia    Myocardial infarction (HCC)    OSA (obstructive sleep apnea)    CPAP   Prostate cancer (HCC)    Type II diabetes mellitus (HCC)     Past Surgical History:  Procedure Laterality Date   CARDIAC CATHETERIZATION N/A 09/19/2015   Procedure: Coronary Stent Intervention;  Surgeon: Demetrica Zipp M [place], MD;  Location: [facility] INVASIVE CV LAB;  Service: Cardiovascular;  Laterality: N/A;   CARDIAC CATHETERIZATION  09/19/2015   Procedure: Left Heart Cath and Cors/Grafts Angiography;  Surgeon: Joshia Kitchings M [place], MD;  Location: [facility] INVASIVE CV LAB;  Service: Cardiovascular;;   CHOLECYSTECTOMY     CORONARY ARTERY BYPASS GRAFT     JOINT REPLACEMENT     right knee   LEFT HEART CATH AND CORS/GRAFTS ANGIOGRAPHY N/A 06/26/2017   Procedure: LEFT HEART CATH AND CORS/GRAFTS ANGIOGRAPHY;  Surgeon: [place], Alizandra Loh M, MD;  Location: [facility] INVASIVE CV LAB;  Service: Cardiovascular;  Laterality: N/A;   left knee replacement  PROSTATE SURGERY     REVERSE SHOULDER ARTHROPLASTY Right 06/23/2021   Procedure: REVERSE SHOULDER ARTHROPLASTY;  Surgeon: Beverely Low, MD;  Location: [facility] ORS;  Service: Orthopedics;  Laterality: Right;  with ISB   right carpal tunnel       Current Medications: Current Meds  Medication Sig   acetaminophen (TYLENOL) 650 MG CR tablet Take 1,300 mg by mouth every 8 (eight) hours as needed for pain.   aspirin EC 81 MG tablet Take 81 mg by mouth daily.   carvedilol (COREG) 12.5 MG tablet Take 1 tablet by mouth twice daily   Cholecalciferol (VITAMIN D) 2000 units tablet Take 2,000 Units by mouth daily.   clindamycin (CLEOCIN) 150 MG capsule Take 300 mg by mouth See admin instructions. Take 300 mg  1 hour before dental procedure.   colestipol (COLESTID) 1 G tablet Take 1 g by mouth at bedtime.    Continuous Blood Gluc Receiver (FREESTYLE LIBRE 2 READER) DEVI See admin instructions.   Continuous Blood Gluc Sensor (FREESTYLE LIBRE 2 SENSOR) MISC USE AS DIRECTED TO obtain BLOOD GLUCOSE FOUR TIMES DAILY   Continuous Blood Gluc Sensor (FREESTYLE LIBRE 2 SENSOR) MISC USE AS DIRECTED TO OBTAIN BLOOD GLUCOSE FOUR TIMES DAILY   DULoxetine (CYMBALTA) 60 MG capsule Take 60 mg by mouth daily.   empagliflozin (JARDIANCE) 25 MG TABS tablet Take by mouth daily.   EPINEPHrine 0.3 mg/0.3 mL IJ SOAJ injection Inject 0.3 mg into the muscle daily as needed for anaphylaxis.   fluticasone (FLONASE) 50 MCG/ACT nasal spray Place 2 sprays into both nostrils daily as needed for allergies or rhinitis.   glipiZIDE (GLUCOTROL) 5 MG tablet Take 5 mg by mouth 2 (two) times daily.   guaiFENesin (MUCINEX) 600 MG 12 hr tablet Take 600-1,200 mg by mouth 2 (two) times daily as needed (for sinus drainage/cough/congestion).   indomethacin (INDOCIN) 25 MG capsule Take 25 mg by mouth 2 (two) times daily with a meal.   levothyroxine (SYNTHROID) 100 MCG tablet Take 100 mcg by mouth daily.   loperamide (IMODIUM A-D) 2 MG tablet Take 2-4 mg by mouth 4 (four) times daily as needed for diarrhea or loose stools.   losartan (COZAAR) 100 MG tablet TAKE 1 TABLET BY MOUTH ONCE DAILY.   metFORMIN (GLUCOPHAGE) 1000 MG tablet Take 1 tablet (1,000 mg total) by mouth 2 (two) times daily.   Multiple Vitamin (MULTIVITAMIN) tablet Take 1 tablet by mouth daily.   Omega-3 Fatty Acids (FISH OIL) 1000 MG CAPS Take 1,000 mg by mouth in the morning and at bedtime.   pantoprazole (PROTONIX) 20 MG tablet Take 1 tablet (20 mg total) by mouth daily.   potassium citrate (UROCIT-K) 10 MEQ (1080 MG) SR tablet Take 10 mEq by mouth 2 (two) times daily.    rosuvastatin (CRESTOR) 10 MG tablet Take 1 tablet by mouth once daily   traZODone (DESYREL) 50 MG tablet  Take 50 mg by mouth at bedtime.   triamcinolone cream (KENALOG) 0.1 % Apply 1 application topically daily as needed (psoriasis).   vitamin B-12 (CYANOCOBALAMIN) 250 MCG tablet Take 250 mcg by mouth daily.   [DISCONTINUED] nitroGLYCERIN (NITROSTAT) 0.4 MG SL tablet Place 1 tablet (0.4 mg total) under the tongue every 5 (five) minutes as needed for chest pain.     Allergies:   Brilinta [ticagrelor], Yellow jacket venom, Morphine, Penicillins, Percocet [oxycodone-acetaminophen], Vicodin [hydrocodone-acetaminophen], Alprazolam, and Septra ds [sulfamethoxazole-trimethoprim]   Social History   Socioeconomic History   Marital status: Married    Spouse name:  Not on file   Number of children: Not on file   Years of education: Not on file   Highest education level: Not on file  Occupational History   Not on file  Tobacco Use   Smoking status: Never   Smokeless tobacco: Never  Vaping Use   Vaping status: Never Used  Substance and Sexual Activity   Alcohol use: No    Alcohol/week: 0.0 standard drinks of alcohol   Drug use: No   Sexual activity: Not on file  Other Topics Concern   Not on file  Social History Narrative   Not on file   Social Determinants of Health   Financial Resource Strain: Not on file  Food Insecurity: Not on file  Transportation Needs: Not on file  Physical Activity: Not on file  Stress: Not on file  Social Connections: Not on file     Family History: The patient's family history includes Heart attack in his father; Heart disease in his mother; Vascular Disease in his sister.  ROS:   Please see the history of present illness.     All other systems reviewed and are negative.  EKGs/Labs/Other Studies Reviewed:    The following studies were reviewed today:  Cath 06/26/2017 Prox RCA to Dist RCA lesion, 100 %stenosed. Prox LAD to Mid LAD lesion, 70 %stenosed. Dist LAD lesion, 50 %stenosed. 1st Diag lesion, 100 %stenosed. Ost Ramus lesion, 100  %stenosed. 2nd Mrg lesion, 99 %stenosed. Mid Cx to Dist Cx lesion, 60 %stenosed. SVG to RCA- Origin lesion, 100 %stenosed. SVG sequential to ramus intermediate and OM is widely patent. SVG to diagonal is patent. LIMA and is normal in caliber. LV end diastolic pressure is normal.   1. Severe 3 vessel obstructive CAD 2. Patent LIMA to the LAD 3. Patent SVG to ramus and OM branches sequentially. Stent is widely patent 4. Patent SVG to diagonal 5. SVG to RCA known to be occluded. 6. Normal LVEDP   Plan: no new disease to explain symptoms. Recommend continued medical therapy.  Carotid doppler 08/02/21: Summary:  Right Carotid: Velocities in the right ICA are consistent with a 1-39%  stenosis.                 Hemodynamically significant plaque >50% visualized in the  CCA.                 The ECA appears >50% stenosed. Stable RICA velocities.   Left Carotid: Velocities in the left ICA are consistent with a 40-59%  stenosis.                Non-hemodynamically significant plaque <50% noted in the  CCA.                Stable LICA velocities.   Vertebrals:  Left vertebral artery demonstrates antegrade flow. Atypical               antegrade flow in the right vertebral artery.  Subclavians: Bilateral subclavian artery flow was disturbed.   *See table(s) above for measurements and observations.  Suggest follow up study in 12 months.    Electronically signed by Dina Rich MD on 08/02/2021 at 5:28:48 PM.      EKG Interpretation Date/Time:  Thursday May 23 2023 11:21:58 EDT Ventricular Rate:  79 PR Interval:  170 QRS Duration:  106 QT Interval:  400 QTC Calculation: 458 R Axis:   -22  Text Interpretation: Normal sinus rhythm Possible Inferior infarct , age undetermined When compared with  ECG of 20-Sep-2015 03:50, Borderline criteria for Inferior infarct are now Present Nonspecific T wave abnormality no longer evident in Inferior leads Nonspecific T wave abnormality no longer  evident in Anterolateral leads QT has lengthened Confirmed by [place], Yosselin Zoeller [phone]) on 05/23/2023 11:31:28 AM     Recent Labs: No results found for requested labs within last 365 days.  Recent Lipid Panel    Component Value Date/Time   CHOL 121 09/28/2021 1143   TRIG 209 (H) 09/28/2021 1143   HDL 33 (L) 09/28/2021 1143   CHOLHDL 3.7 09/28/2021 1143   CHOLHDL 4.5 07/20/2016 0950   VLDL 47 (H) 07/20/2016 0950   LDLCALC 54 09/28/2021 1143   LDLDIRECT 76.0 02/28/2015 0939   Dated 01/22/22: A1c 7.3%. BUN 22, creatinine 1.37. cholesterol 123, triglycerides 233, HDL 34, LDL 52. Otherwise CMET normal.        Physical Exam:    VS:  BP 112/64   Pulse 79   Ht 5\' 9"  (1.753 m)   Wt 199 lb 6.4 oz (90.4 kg)   SpO2 95%   BMI 29.45 kg/m     Wt Readings from Last 3 Encounters:  05/23/23 199 lb 6.4 oz (90.4 kg)  06/29/22 201 lb (91.2 kg)  10/05/21 206 lb 3.2 oz (93.5 kg)     GEN:  Well nourished, well developed in no acute distress HEENT: Normal NECK: No JVD; No carotid bruits LYMPHATICS: No lymphadenopathy CARDIAC: RRR, no murmurs, rubs, gallops RESPIRATORY:  Clear to auscultation without rales, wheezing or rhonchi  ABDOMEN: Soft, non-tender, non-distended MUSCULOSKELETAL:  trace left ankle  edema; No deformity  SKIN: Warm and dry NEUROLOGIC:  Alert and oriented x 3 PSYCHIATRIC:  Normal affect   ASSESSMENT:    1. Coronary artery disease of bypass graft of native heart with stable angina pectoris (HCC)   2. Coronary artery disease involving coronary bypass graft of native heart without angina pectoris   3. Mixed hyperlipidemia   4. Essential hypertension   5. Controlled type 2 diabetes mellitus without complication, without long-term current use of insulin (HCC)       PLAN:    In order of problems listed above:  CAD: s/p CABG in 2005. S/p stenting of SVG to OM in 2016. Cath in 2018 stable. Normal CPX in 2019. Normal LV function on Echo. he has stable class 2 angina but  activity very limited. It has been 5 years since we have done significant cardiac testing. Needs clearance for general anesthesia for lumbar surgery. Will arrange cardiac PET CT. No change.  On aspirin, amlodipine, and carvedilol.   Hypertension: Blood pressure is well controlled.   Hyperlipidemia: Continue Crestor. LDL 52 is at goal.  DM2: Managed by primary care provider. A1c improved to 7.3%.   Hypothyroidism on levothyroxine.     Medication Adjustments/Labs and Tests Ordered: Current medicines are reviewed at length with the patient today.  Concerns regarding medicines are outlined above.  Orders Placed This Encounter  Procedures   Cardiac Stress Test: Informed Consent Details: Physician/Practitioner Attestation; Transcribe to consent form and obtain patient signature   EKG 12-Lead    Meds ordered this encounter  Medications   DISCONTD: nitroGLYCERIN (NITROSTAT) 0.4 MG SL tablet    Sig: Place 1 tablet (0.4 mg total) under the tongue every 5 (five) minutes as needed for chest pain.    Dispense:  25 tablet    Refill:  6    PLEASE SCHEDULE AN APPT FOR FUTURE REFILLS   nitroGLYCERIN (NITROSTAT) 0.4 MG  SL tablet    Sig: Place 1 tablet (0.4 mg total) under the tongue every 5 (five) minutes as needed for chest pain.    Dispense:  25 tablet    Refill:  6    PLEASE SCHEDULE AN APPT FOR FUTURE REFILLS     There are no Patient Instructions on file for this visit.   Signed, Ahmod Gillespie [place], MD  05/23/2023 11:52 AM    [facility]

## 2023-05-22 ENCOUNTER — Other Ambulatory Visit: Payer: Self-pay

## 2023-05-22 DIAGNOSIS — Z79899 Other long term (current) drug therapy: Secondary | ICD-10-CM

## 2023-05-22 NOTE — Telephone Encounter (Signed)
Called patient left message on voice mail to call back. 

## 2023-05-22 NOTE — Telephone Encounter (Signed)
Spoke to patient he stated he will come early tomorrow 7/25.He will have lab done before his appointment with Dr.Jordan.Lab orders placed.

## 2023-05-22 NOTE — Addendum Note (Signed)
Addended by: Neoma Laming on: 05/22/2023 01:53 PM   Modules accepted: Orders

## 2023-05-23 ENCOUNTER — Ambulatory Visit: Payer: PPO | Attending: Cardiology | Admitting: Cardiology

## 2023-05-23 ENCOUNTER — Encounter: Payer: Self-pay | Admitting: Cardiology

## 2023-05-23 ENCOUNTER — Other Ambulatory Visit: Payer: Self-pay

## 2023-05-23 VITALS — BP 112/64 | HR 79 | Ht 69.0 in | Wt 199.4 lb

## 2023-05-23 DIAGNOSIS — I779 Disorder of arteries and arterioles, unspecified: Secondary | ICD-10-CM

## 2023-05-23 DIAGNOSIS — E782 Mixed hyperlipidemia: Secondary | ICD-10-CM

## 2023-05-23 DIAGNOSIS — Z96652 Presence of left artificial knee joint: Secondary | ICD-10-CM | POA: Diagnosis not present

## 2023-05-23 DIAGNOSIS — E119 Type 2 diabetes mellitus without complications: Secondary | ICD-10-CM

## 2023-05-23 DIAGNOSIS — I25708 Atherosclerosis of coronary artery bypass graft(s), unspecified, with other forms of angina pectoris: Secondary | ICD-10-CM | POA: Diagnosis not present

## 2023-05-23 DIAGNOSIS — M7052 Other bursitis of knee, left knee: Secondary | ICD-10-CM | POA: Diagnosis not present

## 2023-05-23 DIAGNOSIS — Z01818 Encounter for other preprocedural examination: Secondary | ICD-10-CM | POA: Diagnosis not present

## 2023-05-23 DIAGNOSIS — I10 Essential (primary) hypertension: Secondary | ICD-10-CM | POA: Diagnosis not present

## 2023-05-23 DIAGNOSIS — Z79899 Other long term (current) drug therapy: Secondary | ICD-10-CM | POA: Diagnosis not present

## 2023-05-23 DIAGNOSIS — I25810 Atherosclerosis of coronary artery bypass graft(s) without angina pectoris: Secondary | ICD-10-CM

## 2023-05-23 DIAGNOSIS — M25562 Pain in left knee: Secondary | ICD-10-CM | POA: Diagnosis not present

## 2023-05-23 MED ORDER — NITROGLYCERIN 0.4 MG SL SUBL: 0.4000 mg | SUBLINGUAL_TABLET | SUBLINGUAL | 6 refills | Status: DC | PRN

## 2023-05-23 MED ORDER — NITROGLYCERIN 0.4 MG SL SUBL
0.4000 mg | SUBLINGUAL_TABLET | SUBLINGUAL | 6 refills | Status: AC | PRN
Start: 2023-05-23 — End: ?

## 2023-05-23 NOTE — Patient Instructions (Addendum)
Medication Instructions:   Nitroglycerin refilled  *If you need a refill on your cardiac medications before your next appointment, please call your pharmacy*   Lab Work: None needed  If you have labs (blood work) drawn today and your tests are completely normal, you will receive your results only by: MyChart Message (if you have MyChart) OR A paper copy in the mail If you have any lab test that is abnormal or we need to change your treatment, we will call you to review the results.   Testing/Procedures: How to Prepare for Your Cardiac PET/CT Stress Test:  1. Please do not take these medications before your test:   Medications that may interfere with the cardiac pharmacological stress agent (ex. nitrates - including erectile dysfunction medications, isosorbide mononitrate, tamulosin or beta-blockers) the day of the exam. (Erectile dysfunction medication should be held for at least 72 hrs prior to test) Theophylline containing medications for 12 hours. Dipyridamole 48 hours prior to the test. Your remaining medications may be taken with water.  2. Nothing to eat or drink, except water, 3 hours prior to arrival time.   NO caffeine/decaffeinated products, or chocolate 12 hours prior to arrival.  3. NO perfume, cologne or lotion  4. Total time is 1 to 2 hours; you may want to bring reading material for the waiting time.  5. Please report to Radiology at the [facility] Main Entrance 30 minutes early for your test.  [address] [place], [place] [address]  6. Please report to Radiology at [facility] Main Entrance, medical mall, 30 mins prior to your test.  [address]  [place], [place]  [phone]  Diabetic Preparation:  Hold oral medications. You may take NPH and Lantus insulin. Do not take Humalog or Humulin R (Regular Insulin) the day of your test. Check blood sugars prior to leaving the house. If able to eat breakfast prior  to 3 hour fasting, you may take all medications, including your insulin, Do not worry if you miss your breakfast dose of insulin - start at your next meal.  IF YOU THINK YOU MAY BE PREGNANT, OR ARE NURSING PLEASE INFORM THE TECHNOLOGIST.  In preparation for your appointment, medication and supplies will be purchased.  Appointment availability is limited, so if you need to cancel or reschedule, please call the Radiology Department at [phone] [facility]) OR [phone] [facility])  24 hours in advance to avoid a cancellation fee of $100.00  What to Expect After you Arrive:  Once you arrive and check in for your appointment, you will be taken to a preparation room within the Radiology Department.  A technologist or Nurse will obtain your medical history, verify that you are correctly prepped for the exam, and explain the procedure.  Afterwards,  an IV will be started in your arm and electrodes will be placed on your skin for EKG monitoring during the stress portion of the exam. Then you will be escorted to the PET/CT scanner.  There, staff will get you positioned on the scanner and obtain a blood pressure and EKG.  During the exam, you will continue to be connected to the EKG and blood pressure machines.  A small, safe amount of a radioactive tracer will be injected in your IV to obtain a series of pictures of your heart along with an injection of a stress agent.    After your Exam:  It is recommended that you eat a meal and drink a caffeinated  beverage to counter act any effects of the stress agent.  Drink plenty of fluids for the remainder of the day and urinate frequently for the first couple of hours after the exam.  Your doctor will inform you of your test results within 7-10 business days.  For more information and frequently asked questions, please visit our website : [email or web address]  For questions about your test or how to prepare for your test, please call: Cardiac  Imaging Nurse Navigators Office: [phone]   Follow-Up: At [facility], you and your health needs are our priority.  As part of our continuing mission to provide you with exceptional heart care, we have created designated Provider Care Teams.  These Care Teams include your primary Cardiologist (physician) and Advanced Practice Providers (APPs -  Physician Assistants and Nurse Practitioners) who all work together to provide you with the care you need, when you need it.    Your next appointment:    F/U pending scans  Provider:   Peter [place], MD

## 2023-05-24 LAB — COMPREHENSIVE METABOLIC PANEL
ALT: 12 IU/L (ref 0–44)
Bilirubin Total: 0.3 mg/dL (ref 0.0–1.2)

## 2023-06-04 DIAGNOSIS — M75122 Complete rotator cuff tear or rupture of left shoulder, not specified as traumatic: Secondary | ICD-10-CM | POA: Diagnosis not present

## 2023-06-04 DIAGNOSIS — M1612 Unilateral primary osteoarthritis, left hip: Secondary | ICD-10-CM | POA: Diagnosis not present

## 2023-06-10 DIAGNOSIS — M5459 Other low back pain: Secondary | ICD-10-CM | POA: Diagnosis not present

## 2023-06-10 DIAGNOSIS — M25562 Pain in left knee: Secondary | ICD-10-CM | POA: Diagnosis not present

## 2023-06-10 DIAGNOSIS — M25552 Pain in left hip: Secondary | ICD-10-CM | POA: Diagnosis not present

## 2023-06-25 DIAGNOSIS — M5416 Radiculopathy, lumbar region: Secondary | ICD-10-CM | POA: Diagnosis not present

## 2023-07-02 ENCOUNTER — Telehealth (HOSPITAL_COMMUNITY): Payer: Self-pay | Admitting: *Deleted

## 2023-07-02 ENCOUNTER — Encounter (HOSPITAL_COMMUNITY): Payer: Self-pay

## 2023-07-02 NOTE — Telephone Encounter (Signed)
Reaching out to patient to offer assistance regarding upcoming cardiac imaging study; pt verbalizes understanding of appt date/time, parking situation and where to check in, pre-test NPO status and verified current allergies; name and call back number provided for further questions should they arise  Merle Prescott RN Navigator Cardiac Imaging [facility] [phone] office [phone] cell  Patient aware to avoid caffeine 12 hours prior to his cardiac PET scan. 

## 2023-07-03 ENCOUNTER — Encounter (HOSPITAL_COMMUNITY)
Admission: RE | Admit: 2023-07-03 | Discharge: 2023-07-03 | Disposition: A | Discharge status: Home / Self Care | Payer: PPO | Source: Ambulatory Visit | Attending: Cardiology | Admitting: Cardiology

## 2023-07-03 DIAGNOSIS — I25708 Atherosclerosis of coronary artery bypass graft(s), unspecified, with other forms of angina pectoris: Secondary | ICD-10-CM | POA: Diagnosis not present

## 2023-07-03 DIAGNOSIS — Z01818 Encounter for other preprocedural examination: Secondary | ICD-10-CM

## 2023-07-03 DIAGNOSIS — Z0189 Encounter for other specified special examinations: Secondary | ICD-10-CM | POA: Diagnosis not present

## 2023-07-03 DIAGNOSIS — I25810 Atherosclerosis of coronary artery bypass graft(s) without angina pectoris: Secondary | ICD-10-CM

## 2023-07-03 LAB — NM PET CT CARDIAC PERFUSION MULTI W/ABSOLUTE BLOODFLOW
LV dias vol: 108 mL (ref 62–150)
LV sys vol: 61 mL
MBFR: 1.24
Nuc Rest EF: 44 %
Nuc Stress EF: 46 %
Rest MBF: 0.78 ml/g/min
Rest Nuclear Isotope Dose: 22.4 mCi
ST Depression (mm): 0 mm
Stress MBF: 0.97 ml/g/min
Stress Nuclear Isotope Dose: 22.3 mCi

## 2023-07-03 MED ORDER — REGADENOSON 0.4 MG/5ML IV SOLN
0.4000 mg | Freq: Once | INTRAVENOUS | Status: AC
  Administered 2023-07-03: 0.4 mg via INTRAVENOUS

## 2023-07-03 MED ORDER — REGADENOSON 0.4 MG/5ML IV SOLN
INTRAVENOUS | Status: AC
  Filled 2023-07-03: qty 5

## 2023-07-03 MED ORDER — RUBIDIUM RB82 GENERATOR (RUBYFILL)
22.3600 | PACK | Freq: Once | INTRAVENOUS | Status: AC
  Administered 2023-07-03: 22.36 via INTRAVENOUS

## 2023-07-03 MED ORDER — RUBIDIUM RB82 GENERATOR (RUBYFILL)
22.3400 | PACK | Freq: Once | INTRAVENOUS | Status: AC
  Administered 2023-07-03: 22.34 via INTRAVENOUS

## 2023-07-09 DIAGNOSIS — I25118 Atherosclerotic heart disease of native coronary artery with other forms of angina pectoris: Secondary | ICD-10-CM | POA: Diagnosis not present

## 2023-07-09 DIAGNOSIS — M5459 Other low back pain: Secondary | ICD-10-CM | POA: Diagnosis not present

## 2023-07-09 DIAGNOSIS — M5432 Sciatica, left side: Secondary | ICD-10-CM | POA: Diagnosis not present

## 2023-07-09 DIAGNOSIS — Z7982 Long term (current) use of aspirin: Secondary | ICD-10-CM | POA: Diagnosis not present

## 2023-07-09 DIAGNOSIS — Z6829 Body mass index (BMI) 29.0-29.9, adult: Secondary | ICD-10-CM | POA: Diagnosis not present

## 2023-07-09 DIAGNOSIS — F331 Major depressive disorder, recurrent, moderate: Secondary | ICD-10-CM | POA: Diagnosis not present

## 2023-07-22 DIAGNOSIS — M1612 Unilateral primary osteoarthritis, left hip: Secondary | ICD-10-CM | POA: Diagnosis not present

## 2023-07-25 ENCOUNTER — Telehealth: Payer: Self-pay | Admitting: *Deleted

## 2023-07-25 NOTE — Telephone Encounter (Signed)
Pre-operative Risk Assessment    Patient Name: Paul [name] [facility]  DOB: [birth year] MRN: [id number]      Request for Surgical Clearance    Procedure:   Left Total Hip Arthroplasty  Date of Surgery:  Clearance 09/17/23                                 Surgeon:  Dr. Durene Romans Surgeon's Group or Practice Name:  [facility] Phone number:  [phone] Fax number:  [phone]   Type of Clearance Requested:   - Medical  - Pharmacy:  Hold Aspirin Not Indicated.   Type of Anesthesia:  Spinal   Additional requests/questions:    Signed, Emmit Pomfret   07/25/2023, 1:25 PM

## 2023-07-25 NOTE — Telephone Encounter (Signed)
Patient Name: Paul [name]  DOB: [birth year] MRN: [id number]  Primary Cardiologist: Peter [place], MD  Chart reviewed as part of pre-operative protocol coverage. Given past medical history and time since last visit, based on ACC/AHA guidelines, KADE MAZZIOTTI is at acceptable risk for the planned procedure without further cardiovascular testing.   Patient recently underwent PET stress test.  This was reviewed by Dr. [place] who has cleared the patient to undergo surgery.  Ideally he should continue on the aspirin through the surgery, however if absolutely needed, may hold aspirin for 5 to 7 days prior to the surgery and restart as soon as possible afterward at the surgeon's discretion.  I will route this recommendation to the requesting party via Epic fax function and remove from pre-op pool.  Please call with questions.  [place], [place] 07/25/2023, 1:36 PM

## 2023-08-07 DIAGNOSIS — I10 Essential (primary) hypertension: Secondary | ICD-10-CM | POA: Diagnosis not present

## 2023-08-07 DIAGNOSIS — E039 Hypothyroidism, unspecified: Secondary | ICD-10-CM | POA: Diagnosis not present

## 2023-08-07 DIAGNOSIS — I25118 Atherosclerotic heart disease of native coronary artery with other forms of angina pectoris: Secondary | ICD-10-CM | POA: Diagnosis not present

## 2023-08-07 DIAGNOSIS — E785 Hyperlipidemia, unspecified: Secondary | ICD-10-CM | POA: Diagnosis not present

## 2023-08-07 DIAGNOSIS — G4733 Obstructive sleep apnea (adult) (pediatric): Secondary | ICD-10-CM | POA: Diagnosis not present

## 2023-08-07 DIAGNOSIS — F3342 Major depressive disorder, recurrent, in full remission: Secondary | ICD-10-CM | POA: Diagnosis not present

## 2023-08-07 DIAGNOSIS — M1612 Unilateral primary osteoarthritis, left hip: Secondary | ICD-10-CM | POA: Diagnosis not present

## 2023-08-07 DIAGNOSIS — C61 Malignant neoplasm of prostate: Secondary | ICD-10-CM | POA: Diagnosis not present

## 2023-08-07 DIAGNOSIS — Z0000 Encounter for general adult medical examination without abnormal findings: Secondary | ICD-10-CM | POA: Diagnosis not present

## 2023-08-07 DIAGNOSIS — Z79899 Other long term (current) drug therapy: Secondary | ICD-10-CM | POA: Diagnosis not present

## 2023-08-07 DIAGNOSIS — E1122 Type 2 diabetes mellitus with diabetic chronic kidney disease: Secondary | ICD-10-CM | POA: Diagnosis not present

## 2023-08-07 DIAGNOSIS — Z23 Encounter for immunization: Secondary | ICD-10-CM | POA: Diagnosis not present

## 2023-08-12 ENCOUNTER — Ambulatory Visit (HOSPITAL_COMMUNITY)
Admission: RE | Admit: 2023-08-12 | Payer: PPO | Source: Ambulatory Visit | Attending: Cardiology | Admitting: Cardiology

## 2023-08-14 DIAGNOSIS — H35363 Drusen (degenerative) of macula, bilateral: Secondary | ICD-10-CM | POA: Diagnosis not present

## 2023-08-14 DIAGNOSIS — H35372 Puckering of macula, left eye: Secondary | ICD-10-CM | POA: Diagnosis not present

## 2023-08-14 DIAGNOSIS — Z961 Presence of intraocular lens: Secondary | ICD-10-CM | POA: Diagnosis not present

## 2023-08-14 DIAGNOSIS — H353131 Nonexudative age-related macular degeneration, bilateral, early dry stage: Secondary | ICD-10-CM | POA: Diagnosis not present

## 2023-08-14 DIAGNOSIS — H35453 Secondary pigmentary degeneration, bilateral: Secondary | ICD-10-CM | POA: Diagnosis not present

## 2023-08-14 DIAGNOSIS — H59812 Chorioretinal scars after surgery for detachment, left eye: Secondary | ICD-10-CM | POA: Diagnosis not present

## 2023-08-30 ENCOUNTER — Ambulatory Visit (HOSPITAL_COMMUNITY)
Admission: RE | Admit: 2023-08-30 | Discharge: 2023-08-30 | Disposition: A | Discharge status: Home / Self Care | Payer: PPO | Source: Ambulatory Visit | Attending: Cardiology | Admitting: Cardiology

## 2023-08-30 DIAGNOSIS — I779 Disorder of arteries and arterioles, unspecified: Secondary | ICD-10-CM | POA: Diagnosis not present

## 2023-08-30 DIAGNOSIS — M1612 Unilateral primary osteoarthritis, left hip: Secondary | ICD-10-CM | POA: Diagnosis not present

## 2023-08-30 DIAGNOSIS — I6523 Occlusion and stenosis of bilateral carotid arteries: Secondary | ICD-10-CM | POA: Insufficient documentation

## 2023-09-03 ENCOUNTER — Other Ambulatory Visit: Payer: Self-pay

## 2023-09-03 DIAGNOSIS — I779 Disorder of arteries and arterioles, unspecified: Secondary | ICD-10-CM

## 2023-09-17 ENCOUNTER — Ambulatory Visit (HOSPITAL_COMMUNITY): Admit: 2023-09-17 | Payer: PPO | Admitting: Orthopedic Surgery

## 2023-09-17 DIAGNOSIS — M1612 Unilateral primary osteoarthritis, left hip: Secondary | ICD-10-CM

## 2023-09-17 SURGERY — ARTHROPLASTY, HIP, TOTAL, ANTERIOR APPROACH
Anesthesia: Spinal | Site: Hip | Laterality: Left

## 2023-10-11 DIAGNOSIS — E1165 Type 2 diabetes mellitus with hyperglycemia: Secondary | ICD-10-CM | POA: Diagnosis not present

## 2023-10-11 DIAGNOSIS — Z79899 Other long term (current) drug therapy: Secondary | ICD-10-CM | POA: Diagnosis not present

## 2023-10-11 DIAGNOSIS — E039 Hypothyroidism, unspecified: Secondary | ICD-10-CM | POA: Diagnosis not present

## 2023-10-11 DIAGNOSIS — R809 Proteinuria, unspecified: Secondary | ICD-10-CM | POA: Diagnosis not present

## 2023-10-11 DIAGNOSIS — E785 Hyperlipidemia, unspecified: Secondary | ICD-10-CM | POA: Diagnosis not present

## 2023-10-17 DIAGNOSIS — E1165 Type 2 diabetes mellitus with hyperglycemia: Secondary | ICD-10-CM | POA: Diagnosis not present

## 2023-10-17 DIAGNOSIS — I25118 Atherosclerotic heart disease of native coronary artery with other forms of angina pectoris: Secondary | ICD-10-CM | POA: Diagnosis not present

## 2023-10-17 DIAGNOSIS — Z23 Encounter for immunization: Secondary | ICD-10-CM | POA: Diagnosis not present

## 2023-10-17 DIAGNOSIS — M1612 Unilateral primary osteoarthritis, left hip: Secondary | ICD-10-CM | POA: Diagnosis not present

## 2023-10-17 DIAGNOSIS — F331 Major depressive disorder, recurrent, moderate: Secondary | ICD-10-CM | POA: Diagnosis not present

## 2023-10-17 DIAGNOSIS — G4733 Obstructive sleep apnea (adult) (pediatric): Secondary | ICD-10-CM | POA: Diagnosis not present

## 2023-10-17 DIAGNOSIS — Z6828 Body mass index (BMI) 28.0-28.9, adult: Secondary | ICD-10-CM | POA: Diagnosis not present

## 2023-10-17 DIAGNOSIS — E039 Hypothyroidism, unspecified: Secondary | ICD-10-CM | POA: Diagnosis not present

## 2023-10-17 DIAGNOSIS — I10 Essential (primary) hypertension: Secondary | ICD-10-CM | POA: Diagnosis not present

## 2023-10-17 DIAGNOSIS — E785 Hyperlipidemia, unspecified: Secondary | ICD-10-CM | POA: Diagnosis not present

## 2023-10-17 DIAGNOSIS — E1122 Type 2 diabetes mellitus with diabetic chronic kidney disease: Secondary | ICD-10-CM | POA: Diagnosis not present

## 2023-10-17 DIAGNOSIS — K219 Gastro-esophageal reflux disease without esophagitis: Secondary | ICD-10-CM | POA: Diagnosis not present

## 2024-01-03 DIAGNOSIS — M5459 Other low back pain: Secondary | ICD-10-CM | POA: Diagnosis not present

## 2024-01-15 ENCOUNTER — Telehealth: Payer: Self-pay | Admitting: *Deleted

## 2024-01-15 NOTE — Telephone Encounter (Signed)
   Pre-operative Risk Assessment    Patient Name: Paul [name]  DOB: 1946-01-27 MRN: [id number]   Date of last office visit: 05/23/23 DR. [place] Date of next office visit: NONE   Request for Surgical Clearance    Procedure:   LEFT TOTAL HIP ARTHROPLASTY  Date of Surgery:  Clearance 01/28/24                                Surgeon:  DR. MATTHEW OLIN Surgeon's Group or Practice Name:  [facility] Phone number:  [phone] Rosalva Ferron Fax number:  [phone]   Type of Clearance Requested:   - Medical  - Pharmacy:  Hold Aspirin     Type of Anesthesia:  Spinal   Additional requests/questions:    Elpidio Anis   01/15/2024, 3:26 PM

## 2024-01-16 ENCOUNTER — Telehealth: Payer: Self-pay | Admitting: *Deleted

## 2024-01-16 NOTE — Telephone Encounter (Signed)
 Pt has been scheduled tele preop appt 01/21/24. Med rec and consent are done.      Patient Consent for Virtual Visit        Paul [name] has provided verbal consent on 01/16/2024 for a virtual visit (video or telephone).   CONSENT FOR VIRTUAL VISIT FOR:  Paul [name]  By participating in this virtual visit I agree to the following:  I hereby voluntarily request, consent and authorize [facility] and its employed or contracted physicians, physician assistants, nurse practitioners or other licensed health care professionals (the Practitioner), to provide me with telemedicine health care services (the "Services") as deemed necessary by the treating Practitioner. I acknowledge and consent to receive the Services by the Practitioner via telemedicine. I understand that the telemedicine visit will involve communicating with the Practitioner through live audiovisual communication technology and the disclosure of certain medical information by electronic transmission. I acknowledge that I have been given the opportunity to request an in-person assessment or other available alternative prior to the telemedicine visit and am voluntarily participating in the telemedicine visit.  I understand that I have the right to withhold or withdraw my consent to the use of telemedicine in the course of my care at any time, without affecting my right to future care or treatment, and that the Practitioner or I may terminate the telemedicine visit at any time. I understand that I have the right to inspect all information obtained and/or recorded in the course of the telemedicine visit and may receive copies of available information for a reasonable fee.  I understand that some of the potential risks of receiving the Services via telemedicine include:  Delay or interruption in medical evaluation due to technological equipment failure or disruption; Information transmitted may not be sufficient (e.g. poor  resolution of images) to allow for appropriate medical decision making by the Practitioner; and/or  In rare instances, security protocols could fail, causing a breach of personal health information.  Furthermore, I acknowledge that it is my responsibility to provide information about my medical history, conditions and care that is complete and accurate to the best of my ability. I acknowledge that Practitioner's advice, recommendations, and/or decision may be based on factors not within their control, such as incomplete or inaccurate data provided by me or distortions of diagnostic images or specimens that may result from electronic transmissions. I understand that the practice of medicine is not an exact science and that Practitioner makes no warranties or guarantees regarding treatment outcomes. I acknowledge that a copy of this consent can be made available to me via my patient portal [facility] MyChart), or I can request a printed copy by calling the office of [facility].    I understand that my insurance will be billed for this visit.   I have read or had this consent read to me. I understand the contents of this consent, which adequately explains the benefits and risks of the Services being provided via telemedicine.  I have been provided ample opportunity to ask questions regarding this consent and the Services and have had my questions answered to my satisfaction. I give my informed consent for the services to be provided through the use of telemedicine in my medical care

## 2024-01-16 NOTE — Telephone Encounter (Signed)
 Patient is returning call. Please advise?

## 2024-01-16 NOTE — Telephone Encounter (Signed)
 Returned pt's call to set tele, left vm.

## 2024-01-16 NOTE — Telephone Encounter (Signed)
   Name: Paul [name]  DOB: Feb 18, 1946  MRN: [id number]  Primary Cardiologist: Peter Swaziland, MD   Preoperative team, please contact this patient and set up a phone call appointment for further preoperative risk assessment. Please obtain consent and complete medication review. Thank you for your help.  I confirm that guidance regarding antiplatelet and oral anticoagulation therapy has been completed and, if necessary, noted below.  Ideally he should continue on the aspirin through the surgery, however if absolutely needed, may hold aspirin for 5 to 7 days prior to the surgery and restart as soon as possible afterward at the surgeon's discretion.   I also confirmed the patient resides in the state of [place]. As per [facility] Medical Board telemedicine laws, the patient must reside in the state in which the provider is licensed.   Ronney Asters, NP 01/16/2024, 9:17 AM [facility]

## 2024-01-16 NOTE — Telephone Encounter (Signed)
 Left a message to call back to schedule tele preop appt.

## 2024-01-16 NOTE — Telephone Encounter (Signed)
 Pt returning call

## 2024-01-16 NOTE — Telephone Encounter (Signed)
 Pt has been scheduled tele preop appt 01/21/24. Med rec and consent are done.

## 2024-01-18 NOTE — Patient Instructions (Signed)
 SURGICAL WAITING ROOM VISITATION Patients having surgery or a procedure may have no more than 2 support people in the waiting area - these visitors may rotate in the visitor waiting room.   Due to an increase in RSV and influenza rates and associated hospitalizations, children ages 63 and under may not visit patients in [facility] hospitals. If the patient needs to stay at the hospital during part of their recovery, the visitor guidelines for inpatient rooms apply.  PRE-OP VISITATION  Pre-op nurse will coordinate an appropriate time for 1 support person to accompany the patient in pre-op.  This support person may not rotate.  This visitor will be contacted when the time is appropriate for the visitor to come back in the pre-op area.  Please refer to the [facility] website for the visitor guidelines for Inpatients (after your surgery is over and you are in a regular room).  You are not required to quarantine at this time prior to your surgery. However, you must do this: Hand Hygiene often Do NOT share personal items Notify your provider if you are in close contact with someone who has COVID or you develop fever 100.4 or greater, new onset of sneezing, cough, sore throat, shortness of breath or body aches.  If you test positive for Covid or have been in contact with anyone that has tested positive in the last 10 days please notify you surgeon.    Your procedure is scheduled on:  TUESDAY  January 28, 2024  Report to [facility] Main Entrance: [address]sen entrance where the [employer] is available.   Report to admitting at: 09:00    AM  Call this number if you have any questions or problems the morning of surgery [phone]  Do not eat food after Midnight the night prior to your surgery/procedure.  After Midnight you may have the following liquids until    08:30 AM DAY OF SURGERY  Clear Liquid Diet Water Black Coffee (sugar ok, NO MILK/CREAM OR CREAMERS)  Tea (sugar ok, NO  MILK/CREAM OR CREAMERS) regular and decaf                             Plain Jell-O  with no fruit (NO RED)                                           Fruit ices (not with fruit pulp, NO RED)                                     Popsicles (NO RED)                                                                  Juice: NO CITRUS JUICES: only apple, WHITE grape, WHITE cranberry Sports drinks like Gatorade or Powerade (NO RED)                   The day of surgery:  Drink ONE (1) Pre-Surgery G2 at   08:30 AM the morning of  surgery. Drink in one sitting. Do not sip.  This drink was given to you during your hospital pre-op appointment visit. Nothing else to drink after completing the Pre-Surgery G2 : No candy, chewing gum or throat lozenges.    FOLLOW ANY ADDITIONAL PRE OP INSTRUCTIONS YOU RECEIVED FROM YOUR SURGEON'S OFFICE!!!   Oral Hygiene is also important to reduce your risk of infection.        Remember - BRUSH YOUR TEETH THE MORNING OF SURGERY WITH YOUR REGULAR TOOTHPASTE  Do NOT smoke after Midnight the night before surgery.  Trulicity injection; Stop injections 7-10 days before surgery. Metformin 1000 mg bid, DO NOT TAKE the morning of surgery Empagliflozin (Jardiance)- hold x 72 hrs, Last dose: 01-24-24  STOP TAKING all Vitamins, Herbs and supplements 1 week before your surgery.   Take ONLY these medicines the morning of surgery with A SIP OF WATER: Pantoprazole, levothyroxine, amlodipine, carvedilol, duloxetine. You may take EITHER Tylenol OR Tramadol depending on pain level.  You may use your Flonase nasal spray if needed.  If You have been diagnosed with Sleep Apnea - Bring CPAP mask and tubing day of surgery. We will provide you with a CPAP machine on the day of your surgery.                   You may not have any metal on your body including  jewelry, and body piercing  Do not wear  lotions, powders, cologne, or deodorant  Men may shave face and neck.  Contacts, Hearing  Aids, dentures or bridgework may not be worn into surgery. DENTURES WILL BE REMOVED PRIOR TO SURGERY PLEASE DO NOT APPLY "Poly grip" OR ADHESIVES!!!  You may bring a small overnight bag with you on the day of surgery, only pack items that are not valuable. [facility] IS NOT RESPONSIBLE   FOR VALUABLES THAT ARE LOST OR STOLEN.   Do not bring your home medications to the hospital. The Pharmacy will dispense medications listed on your medication list to you during your admission in the Hospital.  Special Instructions: Bring a copy of your healthcare power of attorney and living will documents the day of surgery, if you wish to have them scanned into your [facility] Medical Records- EPIC  Please read over the following fact sheets you were given: IF YOU HAVE QUESTIONS ABOUT YOUR PRE-OP INSTRUCTIONS, PLEASE CALL [phone].     Pre-operative 5 CHG Bath Instructions   You can play a key role in reducing the risk of infection after surgery. Your skin needs to be as free of germs as possible. You can reduce the number of germs on your skin by washing with CHG (chlorhexidine gluconate) soap before surgery. CHG is an antiseptic soap that kills germs and continues to kill germs even after washing.   DO NOT use if you have an allergy to chlorhexidine/CHG or antibacterial soaps. If your skin becomes reddened or irritated, stop using the CHG and notify one of our RNs at [phone]  Please shower with the CHG soap starting 4 days before surgery using the following schedule: START SHOWERS ON FRIDAY  January 24, 2024  Please keep in mind the following:  DO NOT shave, including legs and underarms, starting the day of your first shower.   You may shave your face at any point before/day of surgery.   Place clean sheets on your bed the  day you start using CHG soap. Use a clean washcloth (not used since being washed) for each shower. DO NOT sleep with pets once you start using the CHG.   CHG Shower Instructions:  If you choose to wash your hair and private area, wash first with your normal shampoo/soap.  After you use shampoo/soap, rinse your hair and body thoroughly to remove shampoo/soap residue.  Turn the water OFF and apply about 3 tablespoons (45 ml) of CHG soap to a CLEAN washcloth.  Apply CHG soap ONLY FROM YOUR NECK DOWN TO YOUR TOES (washing for 3-5 minutes)  DO NOT use CHG soap on face, private areas, open wounds, or sores.  Pay special attention to the area where your surgery is being performed.  If you are having back surgery, having someone wash your back for you may be helpful.  Wait 2 minutes after CHG soap is applied, then you may rinse off the CHG soap.  Pat dry with a clean towel  Put on clean clothes/pajamas   If you choose to wear lotion, please use ONLY the CHG-compatible lotions on the back of this paper.     Additional instructions for the day of surgery: DO NOT APPLY any lotions, deodorants, cologne, or perfumes.   Put on clean/comfortable clothes.  Brush your teeth.  Ask your nurse before applying any prescription medications to the skin.      CHG Compatible Lotions   Aveeno Moisturizing lotion  Cetaphil Moisturizing Cream  Cetaphil Moisturizing Lotion  Clairol Herbal Essence Moisturizing Lotion, Dry Skin  Clairol Herbal Essence Moisturizing Lotion, Extra Dry Skin  Clairol Herbal Essence Moisturizing Lotion, Normal Skin  Curel Age Defying Therapeutic Moisturizing Lotion with Alpha Hydroxy  Curel Extreme Care Body Lotion  Curel Soothing Hands Moisturizing Hand Lotion  Curel Therapeutic Moisturizing Cream, Fragrance-Free  Curel Therapeutic Moisturizing Lotion, Fragrance-Free  Curel Therapeutic Moisturizing Lotion, Original Formula  Eucerin Daily Replenishing Lotion  Eucerin Dry Skin  Therapy Plus Alpha Hydroxy Crme  Eucerin Dry Skin Therapy Plus Alpha Hydroxy Lotion  Eucerin Original Crme  Eucerin Original Lotion  Eucerin Plus Crme Eucerin Plus Lotion  Eucerin TriLipid Replenishing Lotion  Keri Anti-Bacterial Hand Lotion  Keri Deep Conditioning Original Lotion Dry Skin Formula Softly Scented  Keri Deep Conditioning Original Lotion, Fragrance Free Sensitive Skin Formula  Keri Lotion Fast Absorbing Fragrance Free Sensitive Skin Formula  Keri Lotion Fast Absorbing Softly Scented Dry Skin Formula  Keri Original Lotion  Keri Skin Renewal Lotion Keri Silky Smooth Lotion  Keri Silky Smooth Sensitive Skin Lotion  Nivea Body Creamy Conditioning Oil  Nivea Body Extra Enriched Lotion  Nivea Body Original Lotion  Nivea Body Sheer Moisturizing Lotion Nivea Crme  Nivea Skin Firming Lotion  NutraDerm 30 Skin Lotion  NutraDerm Skin Lotion  NutraDerm Therapeutic Skin Cream  NutraDerm Therapeutic Skin Lotion  ProShield Protective Hand Cream  Provon moisturizing lotion   FAILURE TO FOLLOW THESE INSTRUCTIONS MAY RESULT IN THE CANCELLATION OF YOUR SURGERY  PATIENT SIGNATURE_________________________________  NURSE SIGNATURE__________________________________  ________________________________________________________________________        Paul [name]    An incentive spirometer is a tool that can help keep your lungs clear and active. This tool measures how well you are filling your lungs with each breath.  Taking long deep breaths may help reverse or decrease the chance of developing breathing (pulmonary) problems (especially infection) following: A long period of time when you are unable to move or be active. BEFORE THE PROCEDURE  If the spirometer includes an indicator to show your best effort, your nurse or respiratory therapist will set it to a desired goal. If possible, sit up straight or lean slightly forward. Try not to slouch. Hold the incentive  spirometer in an upright position. INSTRUCTIONS FOR USE  Sit on the edge of your bed if possible, or sit up as far as you can in bed or on a chair. Hold the incentive spirometer in an upright position. Breathe out normally. Place the mouthpiece in your mouth and seal your lips tightly around it. Breathe in slowly and as deeply as possible, raising the piston or the ball toward the top of the column. Hold your breath for 3-5 seconds or for as long as possible. Allow the piston or ball to fall to the bottom of the column. Remove the mouthpiece from your mouth and breathe out normally. Rest for a few seconds and repeat Steps 1 through 7 at least 10 times every 1-2 hours when you are awake. Take your time and take a few normal breaths between deep breaths. The spirometer may include an indicator to show your best effort. Use the indicator as a goal to work toward during each repetition. After each set of 10 deep breaths, practice coughing to be sure your lungs are clear. If you have an incision (the cut made at the time of surgery), support your incision when coughing by placing a pillow or rolled up towels firmly against it. Once you are able to get out of bed, walk around indoors and cough well. You may stop using the incentive spirometer when instructed by your caregiver.  RISKS AND COMPLICATIONS Take your time so you do not get dizzy or light-headed. If you are in pain, you may need to take or ask for pain medication before doing incentive spirometry. It is harder to take a deep breath if you are having pain. AFTER USE Rest and breathe slowly and easily. It can be helpful to keep track of a log of your progress. Your caregiver can provide you with a simple table to help with this. If you are using the spirometer at home, follow these instructions: SEEK MEDICAL CARE IF:  You are having difficultly using the spirometer. You have trouble using the spirometer as often as instructed. Your pain  medication is not giving enough relief while using the spirometer. You develop fever of 100.5 F (38.1 C) or higher.                                                                                                    SEEK IMMEDIATE MEDICAL CARE IF:  You cough up bloody sputum that had not been present before. You develop fever of 102 F (38.9 C) or greater. You develop worsening pain at or near the incision site. MAKE SURE YOU:  Understand these instructions. Will watch your  condition. Will get help right away if you are not doing well or get worse. Document Released: 02/25/2007 Document Revised: 01/07/2012 Document Reviewed: 04/28/2007 [facility] Patient Information 2014 [place], [place].        WHAT IS A BLOOD TRANSFUSION? Blood Transfusion Information  A transfusion is the replacement of blood or some of its parts. Blood is made up of multiple cells which provide different functions. Red blood cells carry oxygen and are used for blood loss replacement. White blood cells fight against infection. Platelets control bleeding. Plasma helps clot blood. Other blood products are available for specialized needs, such as hemophilia or other clotting disorders. BEFORE THE TRANSFUSION  Who gives blood for transfusions?  Healthy volunteers who are fully evaluated to make sure their blood is safe. This is blood bank blood. Transfusion therapy is the safest it has ever been in the practice of medicine. Before blood is taken from a donor, a complete history is taken to make sure that person has no history of diseases nor engages in risky social behavior (examples are intravenous drug use or sexual activity with multiple partners). The donor's travel history is screened to minimize risk of transmitting infections, such as malaria. The donated blood is tested for signs of infectious diseases, such as HIV and hepatitis. The blood is then tested to be sure it is compatible with you in order to minimize  the chance of a transfusion reaction. If you or a relative donates blood, this is often done in anticipation of surgery and is not appropriate for emergency situations. It takes many days to process the donated blood. RISKS AND COMPLICATIONS Although transfusion therapy is very safe and saves many lives, the main dangers of transfusion include:  Getting an infectious disease. Developing a transfusion reaction. This is an allergic reaction to something in the blood you were given. Every precaution is taken to prevent this. The decision to have a blood transfusion has been considered carefully by your caregiver before blood is given. Blood is not given unless the benefits outweigh the risks. AFTER THE TRANSFUSION Right after receiving a blood transfusion, you will usually feel much better and more energetic. This is especially true if your red blood cells have gotten low (anemic). The transfusion raises the level of the red blood cells which carry oxygen, and this usually causes an energy increase. The nurse administering the transfusion will monitor you carefully for complications. HOME CARE INSTRUCTIONS  No special instructions are needed after a transfusion. You may find your energy is better. Speak with your caregiver about any limitations on activity for underlying diseases you may have. SEEK MEDICAL CARE IF:  Your condition is not improving after your transfusion. You develop redness or irritation at the intravenous (IV) site. SEEK IMMEDIATE MEDICAL CARE IF:  Any of the following symptoms occur over the next 12 hours: Shaking chills. You have a temperature by mouth above 102 F (38.9 C), not controlled by medicine. Chest, back, or muscle pain. People around you feel you are not acting correctly or are confused. Shortness of breath or difficulty breathing. Dizziness and fainting. You get a rash or develop hives. You have a decrease in urine output. Your urine turns a dark color or changes  to pink, red, or brown. Any of the following symptoms occur over the next 10 days: You have a temperature by mouth above 102 F (38.9 C), not controlled by medicine. Shortness of breath. Weakness after normal activity. The white part of the eye turns yellow (jaundice).  You have a decrease in the amount of urine or are urinating less often. Your urine turns a dark color or changes to pink, red, or brown. Document Released: 10/12/2000 Document Revised: 01/07/2012 Document Reviewed: 05/31/2008 [facility] Patient Information 2014 [place], [place].  _______________________________________________________________________          If you would like to see a video about joint replacement:   [email or web address]

## 2024-01-18 NOTE — Progress Notes (Signed)
 COVID Vaccine received:  []  No [x]  Yes Date of any COVID positive Test in last 90 days: none  PCP - Gweneth Dimitri, MD At [facility] [phone]   clearance done per Ashley's note Cardiologist - Peter Swaziland, MD cardiac clearance phone appt not until 01-21-24,  Chest x-ray - 06-21-2017  2v  Epic EKG -  05-23-2023  Epic Stress Test - Lexiscan 07-03-2023  ECHO -  Cardiac Cath - LHC x 2, last one: 06-26-2017 by Dr. Swaziland  PCR screen: [x]  Ordered & Completed []   No Order but Needs PROFEND     []   N/A for this surgery  Surgery Plan:  []  Ambulatory   [x]  Outpatient in bed  []  Admit Anesthesia:    []  General  [x]  Spinal  []   Choice []   MAC  Pacemaker / ICD device [x]  No []  Yes   Spinal Cord Stimulator:[x]  No []  Yes       History of Sleep Apnea? []  No [x]  Yes   CPAP used?- [x]  No []  Yes    Does the patient monitor blood sugar?   []  N/A   []  No []  Yes  Patient has: []  NO Hx DM   []  Pre-DM   []  DM1  [x]   DM2 Last A1c was: 6.8 Per Ashley's H&P     8.5 on 05-23-23 Epic      Does patient have a [employer] or Dexcom? []  No [x]  Yes   Fasting Blood Sugar Ranges-  Checks Blood Sugar continuous times a day  Trulicity injection; hold 7-10 days   Mondays Held on 01-20-2024 Metformin 1000 mg bid,  Hold DOS Empagliflozin (Jardiance)- hold x 72 hrs, Last dose: 01-24-24 Glipizide-  Day before- am dose only, DOS- don't take  Blood Thinner / Instructions:  none Aspirin Instructions:  ASA 81mg   per patient, Dr. Swaziland instructed him to continue ASA.   ERAS Protocol Ordered: []  No  [x]  Yes PRE-SURGERY []  ENSURE  [x]  G2  Patient is to be NPO after: 08:30  Dental hx: [x]  Dentures: bottom set of dentures []  N/A      []  Bridge or Partial:                   []  Loose or Damaged teeth:   Comments: Patient was given the 5 CHG shower / bath instructions for THA  surgery along with 2 bottles of the CHG soap. Patient will start this on: 01-24-24 All questions were asked and answered, Patient voiced  understanding of this process.   Activity level: Patient is unable to climb a flight of stairs without difficulty; would have some Left side CP and SOB in addition to his leg pain. Patient can perform ADLs without assistance.   Anesthesia review: CAD- CABG x 5, LHC- DES x 1, DM2, GERD, OSA- CPAP, depression  Patient denies shortness of breath, fever, cough and chest pain at PAT appointment.  Patient verbalized understanding and agreement to the Pre-Surgical Instructions that were given to them at this PAT appointment. Patient was also educated of the need to review these PAT instructions again prior to his surgery.I reviewed the appropriate phone numbers to call if they have any and questions or concerns.

## 2024-01-20 ENCOUNTER — Other Ambulatory Visit: Payer: Self-pay

## 2024-01-20 ENCOUNTER — Encounter (HOSPITAL_COMMUNITY)
Admission: RE | Admit: 2024-01-20 | Discharge: 2024-01-20 | Disposition: A | Discharge status: Home / Self Care | Payer: PPO | Source: Ambulatory Visit | Attending: Orthopedic Surgery | Admitting: Orthopedic Surgery

## 2024-01-20 ENCOUNTER — Encounter (HOSPITAL_COMMUNITY): Payer: Self-pay

## 2024-01-20 VITALS — BP 105/56 | HR 82 | Temp 98.6°F | Resp 14 | Ht 69.0 in | Wt 187.0 lb

## 2024-01-20 DIAGNOSIS — I10 Essential (primary) hypertension: Secondary | ICD-10-CM | POA: Diagnosis not present

## 2024-01-20 DIAGNOSIS — Z951 Presence of aortocoronary bypass graft: Secondary | ICD-10-CM | POA: Insufficient documentation

## 2024-01-20 DIAGNOSIS — E119 Type 2 diabetes mellitus without complications: Secondary | ICD-10-CM | POA: Diagnosis not present

## 2024-01-20 DIAGNOSIS — M1612 Unilateral primary osteoarthritis, left hip: Secondary | ICD-10-CM | POA: Insufficient documentation

## 2024-01-20 DIAGNOSIS — Z8546 Personal history of malignant neoplasm of prostate: Secondary | ICD-10-CM | POA: Diagnosis not present

## 2024-01-20 DIAGNOSIS — E039 Hypothyroidism, unspecified: Secondary | ICD-10-CM | POA: Diagnosis not present

## 2024-01-20 DIAGNOSIS — G4733 Obstructive sleep apnea (adult) (pediatric): Secondary | ICD-10-CM | POA: Insufficient documentation

## 2024-01-20 DIAGNOSIS — I2510 Atherosclerotic heart disease of native coronary artery without angina pectoris: Secondary | ICD-10-CM | POA: Insufficient documentation

## 2024-01-20 DIAGNOSIS — I25810 Atherosclerosis of coronary artery bypass graft(s) without angina pectoris: Secondary | ICD-10-CM | POA: Diagnosis not present

## 2024-01-20 DIAGNOSIS — Z7984 Long term (current) use of oral hypoglycemic drugs: Secondary | ICD-10-CM | POA: Diagnosis not present

## 2024-01-20 DIAGNOSIS — Z01818 Encounter for other preprocedural examination: Secondary | ICD-10-CM

## 2024-01-20 DIAGNOSIS — Z01812 Encounter for preprocedural laboratory examination: Secondary | ICD-10-CM | POA: Diagnosis not present

## 2024-01-20 HISTORY — DX: Myoneural disorder, unspecified: G70.9

## 2024-01-20 HISTORY — DX: Headache, unspecified: R51.9

## 2024-01-20 HISTORY — DX: Dysphonia: R49.0

## 2024-01-20 LAB — CBC
HCT: 36 % — ABNORMAL LOW (ref 39.0–52.0)
Hemoglobin: 11 g/dL — ABNORMAL LOW (ref 13.0–17.0)
MCH: 26.4 pg (ref 26.0–34.0)
MCHC: 30.6 g/dL (ref 30.0–36.0)
MCV: 86.3 fL (ref 80.0–100.0)
Platelets: 329 10*3/uL (ref 150–400)
RBC: 4.17 MIL/uL — ABNORMAL LOW (ref 4.22–5.81)
RDW: 15 % (ref 11.5–15.5)
WBC: 8.9 10*3/uL (ref 4.0–10.5)
nRBC: 0 % (ref 0.0–0.2)

## 2024-01-20 LAB — BASIC METABOLIC PANEL
Anion gap: 12 (ref 5–15)
BUN: 25 mg/dL — ABNORMAL HIGH (ref 8–23)
CO2: 25 mmol/L (ref 22–32)
Calcium: 9.8 mg/dL (ref 8.9–10.3)
Chloride: 102 mmol/L (ref 98–111)
Creatinine, Ser: 1.46 mg/dL — ABNORMAL HIGH (ref 0.61–1.24)
GFR, Estimated: 49 mL/min — ABNORMAL LOW (ref >60)
Glucose, Bld: 120 mg/dL — ABNORMAL HIGH (ref 70–99)
Potassium: 4 mmol/L (ref 3.5–5.1)
Sodium: 139 mmol/L (ref 135–145)

## 2024-01-20 LAB — GLUCOSE, CAPILLARY: Glucose-Capillary: 137 mg/dL — ABNORMAL HIGH (ref 70–99)

## 2024-01-21 ENCOUNTER — Encounter: Payer: Self-pay | Admitting: Nurse Practitioner

## 2024-01-21 ENCOUNTER — Ambulatory Visit: Attending: Nurse Practitioner | Admitting: Nurse Practitioner

## 2024-01-21 DIAGNOSIS — Z01810 Encounter for preprocedural cardiovascular examination: Secondary | ICD-10-CM

## 2024-01-21 LAB — SURGICAL PCR SCREEN
MRSA, PCR: POSITIVE — AB
Staphylococcus aureus: POSITIVE — AB

## 2024-01-21 NOTE — Progress Notes (Signed)
 Virtual Visit via Telephone Note   Because of Paul [name] [facility] co-morbid illnesses, he is at least at moderate risk for complications without adequate follow up.  This format is felt to be most appropriate for this patient at this time.  Due to technical limitations with video connection Web designer), today's appointment will be conducted as an audio only telehealth visit, and Paul [name] [facility] verbally agreed to proceed in this manner.   All issues noted in this document were discussed and addressed.  No physical exam could be performed with this format.  Evaluation Performed:  Preoperative cardiovascular risk assessment _____________   Date:  01/21/2024   Patient ID:  Paul [name], DOB [birth year], MRN [id number] Patient Location:  Home Provider location:   Office  Primary Care Provider:  Gweneth Dimitri, MD Primary Cardiologist:  Peter Swaziland, MD  Chief Complaint / Patient Profile   78 y.o. y/o male with a h/o CAD s/p CABG in 2005, carotid artery disease, HTN, HLD, type 2 DM, hypothyroidism, OSA who is pending left total hip arthroplasty with Dr. Charlann Boxer on 01/28/24 and presents today for telephonic preoperative cardiovascular risk assessment.  History of Present Illness    Paul [name] is a 78 y.o. male who presents via audio/video conferencing for a telehealth visit today.  Pt was last seen in cardiology clinic on 05/23/23 by Dr. Swaziland.  At that time Paul [name] was doing well but due to no recent ischemia evaluation he underwent PET/CT on 07/03/2023 which revealed abnormal perfusion in RCA distribution consistent with known RCA occlusion, no significant abnormality in LAD or LCx territory, continue medical therapy.  He was cleared for surgery at that time.  The patient is now pending procedure as outlined above. Since his last visit, he  denies chest pain, shortness of breath, lower extremity edema, palpitations, diaphoresis, weakness, presyncope, syncope, orthopnea, and PND.  He is physically limited by hip and back pain and has been less active for the last 5-6 weeks. He can perform ADLs but is avoiding additional exertion due to significant pain. He is able to achieve > 4 METS Activity without concerning cardiac symptoms.     Past Medical History    Past Medical History:  Diagnosis Date   Allergy    Anxiety    Arthritis    Complication of anesthesia    slow to wake up   Coronary artery disease    a. s/p CABG;  b. 08/2015 MV: inf/inflat infarct w/ new peri-infarct isch; c. LHC 11/16: pLAD 70, dLAD 50, D1 100, oRI 100, mLCx 60, OM2 99, pRCA 99 (L>R collats), S-RPDA 100, S-D1 ok, S-RI/OM2 mid 85 (PCI: Synergy DES), L-LAD ok, EF 55-65%.   Depression    Dyspnea    due to meds pt on per pt   GERD (gastroesophageal reflux disease)    Headache    ocular migraines   History of kidney stones    Hoarseness, chronic    due to sinus issues   Hyperlipidemia    Hypertension    Hypertensive heart disease    Hypothyroidism    Insomnia    Myocardial infarction (HCC)    Neuromuscular disorder (HCC)    polymyalgia rheumatica   OSA (obstructive sleep apnea)    no CPAP   Pneumonia 03/2023   Prostate cancer (HCC)    Type II diabetes mellitus (HCC)    Past Surgical History:  Procedure Laterality Date   CARDIAC CATHETERIZATION N/A 09/19/2015   Procedure: Coronary Stent Intervention;  Surgeon: Peter M Swaziland, MD;  Location: [facility] INVASIVE CV LAB;  Service: Cardiovascular;  Laterality: N/A;   CARDIAC CATHETERIZATION  09/19/2015   Procedure: Left Heart Cath and Cors/Grafts Angiography;  Surgeon: Peter M Swaziland, MD;  Location: [facility] INVASIVE CV LAB;  Service: Cardiovascular;;   CARPAL TUNNEL RELEASE Bilateral    CHOLECYSTECTOMY     Laparoscopic   CORONARY ARTERY BYPASS GRAFT     x5 vessel   EYE SURGERY Bilateral    cataract extraction   EYE SURGERY Left 2024   laser for retina tear   JOINT REPLACEMENT     right knee   LEFT HEART CATH AND CORS/GRAFTS ANGIOGRAPHY N/A  06/26/2017   Procedure: LEFT HEART CATH AND CORS/GRAFTS ANGIOGRAPHY;  Surgeon: Swaziland, Peter M, MD;  Location: [facility] INVASIVE CV LAB;  Service: Cardiovascular;  Laterality: N/A;   left knee replacement      PROSTATE SURGERY     REVERSE SHOULDER ARTHROPLASTY Right 06/23/2021   Procedure: REVERSE SHOULDER ARTHROPLASTY;  Surgeon: Beverely Low, MD;  Location: [facility];  Service: Orthopedics;  Laterality: Right;  with ISB    Allergies  Allergies  Allergen Reactions   Brilinta [Ticagrelor] Shortness Of Breath   Yellow Jacket Venom Anaphylaxis   Morphine Other (See Comments)    Hallucinations/nightmares when taking for greater than 24hrs.   Penicillins Other (See Comments)    "Eyes swell shut" Has patient had a PCN reaction causing immediate rash, facial/tongue/throat swelling, SOB or lightheadedness with hypotension:Yes Has patient had a PCN reaction causing severe rash involving mucus membranes or skin necrosis:No Has patient had a PCN reaction that required hospitalization: No Has patient had a PCN reaction occurring within the last 10 years: No If all of the above answers are "NO", then may proceed with Cephalosporin use.    Percocet [Oxycodone-Acetaminophen]     Nightmares/hallucinations   Vicodin [Hydrocodone-Acetaminophen] Other (See Comments)    Hallucinations/nightmares   Alprazolam Rash   Septra Ds [Sulfamethoxazole-Trimethoprim] Rash    Home Medications    Prior to Admission medications   Medication Sig Start Date End Date Taking? Authorizing Provider  acetaminophen (TYLENOL) 650 MG CR tablet Take 1,300 mg by mouth every 8 (eight) hours as needed for pain.    [provider]  amLODipine (NORVASC) 5 MG tablet Take 5 mg by mouth in the morning and at bedtime.    [provider]  aspirin EC 81 MG tablet Take 81 mg by mouth daily.    [provider]  carvedilol (COREG) 12.5 MG tablet Take 1 tablet by mouth twice daily 11/17/19   Swaziland, Peter M, MD   Cholecalciferol (VITAMIN D) 2000 units tablet Take 2,000 Units by mouth daily.    [provider]  clindamycin (CLEOCIN) 150 MG capsule Take 300 mg by mouth See admin instructions. Take 300 mg 1 hour before dental procedure. 09/30/15   [provider]  Continuous Blood Gluc Receiver (FREESTYLE LIBRE 2 READER) DEVI See admin instructions.    [provider]  Continuous Blood Gluc Sensor (FREESTYLE LIBRE 2 SENSOR) MISC USE AS DIRECTED TO obtain BLOOD GLUCOSE FOUR TIMES DAILY    [provider]  DULoxetine (CYMBALTA) 60 MG capsule Take 60 mg by mouth daily. 05/20/20   [provider]  empagliflozin (JARDIANCE) 25 MG TABS tablet Take 25 mg by mouth daily.    [provider]  EPINEPHrine 0.3 mg/0.3 mL IJ SOAJ injection Inject 0.3 mg into the muscle daily as needed for anaphylaxis.  [provider]  fluticasone (FLONASE) 50 MCG/ACT nasal spray Place 2 sprays into both nostrils daily as needed for allergies or rhinitis.    [provider]  glipiZIDE (GLUCOTROL) 5 MG tablet Take 2.5-5 mg by mouth See admin instructions. 5 mg in the morning, 2.5 mg in the evening 06/25/20   [provider]  guaiFENesin (MUCINEX) 600 MG 12 hr tablet Take 600-1,200 mg by mouth 2 (two) times daily as needed (for sinus drainage/cough/congestion).    [provider]  levothyroxine (SYNTHROID) 100 MCG tablet Take 100 mcg by mouth daily before breakfast.    [provider]  loperamide (IMODIUM A-D) 2 MG tablet Take 2-4 mg by mouth 4 (four) times daily as needed for diarrhea or loose stools.    [provider]  losartan (COZAAR) 100 MG tablet TAKE 1 TABLET BY MOUTH ONCE DAILY. 07/12/20   Swaziland, Peter M, MD  metFORMIN (GLUCOPHAGE) 1000 MG tablet Take 1 tablet (1,000 mg total) by mouth 2 (two) times daily. 06/26/17   Sw[place]il[place], Peter M, MD  nitroGLYCERIN (NITROSTAT) 0.4 MG SL tablet Place 1 tablet (0.4 mg total) under the tongue every  5 (five) minutes as needed for chest pain. 05/23/23   Swaziland, Peter M, MD  Omega-3 Fatty Acids (FISH OIL) 1000 MG CAPS Take 1,000 mg by mouth daily.    [provider]  pantoprazole (PROTONIX) 20 MG tablet Take 1 tablet (20 mg total) by mouth daily. 11/16/16   Swaziland, Peter M, MD  potassium citrate (UROCIT-K) 10 MEQ (1080 MG) SR tablet Take 10 mEq by mouth daily. 02/16/15   [provider]  rosuvastatin (CRESTOR) 10 MG tablet Take 1 tablet by mouth once daily 06/20/20   [place]azil[place], Peter M, MD  traMADol (ULTRAM) 50 MG tablet Take 50 mg by mouth 2 (two) times daily.    [provider]  traZODone (DESYREL) 50 MG tablet Take 50 mg by mouth at bedtime. 03/02/21   [provider]  triamcinolone cream (KENALOG) 0.1 % Apply 1 application topically daily as needed (psoriasis).    [provider]  TRULICITY 3 MG/0.5ML SOAJ Inject 3 mg into the skin once a week. 12/25/23   [provider]  vitamin B-12 (CYANOCOBALAMIN) 250 MCG tablet Take 250 mcg by mouth daily.    [provider]    Physical Exam    Vital Signs:  UZZIAH RIGG does not have vital signs available for review today.  Given telephonic nature of communication, physical exam is limited. AAOx3. NAD. Normal affect.  Speech and respirations are unlabored.  Accessory Clinical Findings    None  Assessment & Plan    1.  Preoperative Cardiovascular Risk Assessment: According to the Revised Cardiac Risk Index (RCRI), his Perioperative Risk of Major Cardiac Event is (%): 6.6. His Functional Capacity in METs is: 4.06 according to the Duke Activity Status Index (DASI). The patient is doing well from a cardiac perspective. Therefore, based on ACC/AHA guidelines, the patient would be at acceptable risk for the planned procedure without further cardiovascular testing.   The patient was advised that if he develops new symptoms prior to surgery to contact our office to arrange for a follow-up  visit, and he verbalized understanding.  Ideally aspirin should be continued without interruption, however if the bleeding risk is too great, aspirin may be held for 5-7 days prior to surgery. Please resume aspirin post operatively when it is felt to be safe from a bleeding standpoint.   A copy  of this note will be routed to requesting surgeon.  Time:   Today, I have spent 10 minutes with the patient with telehealth technology discussing medical history, symptoms, and management plan.    Levi Aland, NP-C  01/21/2024, 1:41 PM [address] Office [phone] Fax [phone]

## 2024-01-21 NOTE — Progress Notes (Signed)
 Anesthesia Chart Review   Case: [id number] Date/Time: 01/28/24 1115   Procedure: ARTHROPLASTY, HIP, TOTAL, ANTERIOR APPROACH (Left: Hip)   Anesthesia type: Spinal   Pre-op diagnosis: Left hip osteoarthritis   [facility] 10 / [facility]   Surgeons: Durene Romans, MD       DISCUSSION:78 y.o. never smoker with h/o HTN, OSA, hypothyroidism, CAD s/p CABG 2005, carotid artery disease, DM II, prostate cancer, left hip OA scheduled for above procedure 01/28/2024 with Dr. Durene Romans.   Per cardiology preoperative evaluation 01/21/2024, "Preoperative Cardiovascular Risk Assessment: According to the Revised Cardiac Risk Index (RCRI), his Perioperative Risk of Major Cardiac Event is (%): 6.6. His Functional Capacity in METs is: 4.06 according to the Duke Activity Status Index (DASI). The patient is doing well from a cardiac perspective. Therefore, based on ACC/AHA guidelines, the patient would be at acceptable risk for the planned procedure without further cardiovascular testing.    The patient was advised that if he develops new symptoms prior to surgery to contact our office to arrange for a follow-up visit, and he verbalized understanding.   Ideally aspirin should be continued without interruption, however if the bleeding risk is too great, aspirin may be held for 5-7 days prior to surgery. Please resume aspirin post operatively when it is felt to be safe from a bleeding standpoint."   VS: BP (!) 105/56 Comment: left arm sitting  Pulse 82   Temp 37 C (Oral)   Resp 14   Ht 5\' 9"  (1.753 m)   Wt 84.8 kg   SpO2 98%   BMI 27.62 kg/m   PROVIDERS: Gweneth Dimitri, MD is PCP   Primary Cardiologist:  Peter Swaziland, MD  LABS: Labs reviewed: Acceptable for surgery. (all labs ordered are listed, but only abnormal results are displayed)  Labs Reviewed  SURGICAL PCR SCREEN - Abnormal; Notable for the following components:      Result Value   MRSA, PCR POSITIVE (*)    Staphylococcus aureus POSITIVE  (*)    All other components within normal limits  BASIC METABOLIC PANEL - Abnormal; Notable for the following components:   Glucose, Bld 120 (*)    BUN 25 (*)    Creatinine, Ser 1.46 (*)    GFR, Estimated 49 (*)    All other components within normal limits  CBC - Abnormal; Notable for the following components:   RBC 4.17 (*)    Hemoglobin 11.0 (*)    HCT 36.0 (*)    All other components within normal limits  GLUCOSE, CAPILLARY - Abnormal; Notable for the following components:   Glucose-Capillary 137 (*)    All other components within normal limits  TYPE AND SCREEN     IMAGES:   EKG:   CV:  Past Medical History:  Diagnosis Date   Allergy    Anxiety    Arthritis    Complication of anesthesia    slow to wake up   Coronary artery disease    a. s/p CABG;  b. 08/2015 MV: inf/inflat infarct w/ new peri-infarct isch; c. LHC 11/16: pLAD 70, dLAD 50, D1 100, oRI 100, mLCx 60, OM2 99, pRCA 99 (L>R collats), S-RPDA 100, S-D1 ok, S-RI/OM2 mid 85 (PCI: Synergy DES), L-LAD ok, EF 55-65%.   Depression    Dyspnea    due to meds pt on per pt   GERD (gastroesophageal reflux disease)    Headache    ocular migraines   History of kidney stones    Hoarseness,  chronic    due to sinus issues   Hyperlipidemia    Hypertension    Hypertensive heart disease    Hypothyroidism    Insomnia    Myocardial infarction [facility])    Neuromuscular disorder (HCC)    polymyalgia rheumatica   OSA (obstructive sleep apnea)    no CPAP   Pneumonia 03/2023   Prostate cancer (HCC)    Type II diabetes mellitus (HCC)     Past Surgical History:  Procedure Laterality Date   CARDIAC CATHETERIZATION N/A 09/19/2015   Procedure: Coronary Stent Intervention;  Surgeon: Peter M Swaziland, MD;  Location: [facility] INVASIVE CV LAB;  Service: Cardiovascular;  Laterality: N/A;   CARDIAC CATHETERIZATION  09/19/2015   Procedure: Left Heart Cath and Cors/Grafts Angiography;  Surgeon: Peter M Swaziland, MD;  Location: [facility] INVASIVE CV  LAB;  Service: Cardiovascular;;   CARPAL TUNNEL RELEASE Bilateral    CHOLECYSTECTOMY     Laparoscopic   CORONARY ARTERY BYPASS GRAFT     x5 vessel   EYE SURGERY Bilateral    cataract extraction   EYE SURGERY Left 2024   laser for retina tear   JOINT REPLACEMENT     right knee   LEFT HEART CATH AND CORS/GRAFTS ANGIOGRAPHY N/A 06/26/2017   Procedure: LEFT HEART CATH AND CORS/GRAFTS ANGIOGRAPHY;  Surgeon: Swaziland, Peter M, MD;  Location: [facility] INVASIVE CV LAB;  Service: Cardiovascular;  Laterality: N/A;   left knee replacement      PROSTATE SURGERY     REVERSE SHOULDER ARTHROPLASTY Right 06/23/2021   Procedure: REVERSE SHOULDER ARTHROPLASTY;  Surgeon: Beverely Low, MD;  Location: [facility];  Service: Orthopedics;  Laterality: Right;  with ISB    MEDICATIONS:  acetaminophen (TYLENOL) 650 MG CR tablet   amLODipine (NORVASC) 5 MG tablet   aspirin EC 81 MG tablet   carvedilol (COREG) 12.5 MG tablet   Cholecalciferol (VITAMIN D) 2000 units tablet   clindamycin (CLEOCIN) 150 MG capsule   Continuous Blood Gluc Receiver (FREESTYLE LIBRE 2 READER) DEVI   Continuous Blood Gluc Sensor (FREESTYLE LIBRE 2 SENSOR) MISC   DULoxetine (CYMBALTA) 60 MG capsule   empagliflozin (JARDIANCE) 25 MG TABS tablet   EPINEPHrine 0.3 mg/0.3 mL IJ SOAJ injection   fluticasone (FLONASE) 50 MCG/ACT nasal spray   glipiZIDE (GLUCOTROL) 5 MG tablet   guaiFENesin (MUCINEX) 600 MG 12 hr tablet   levothyroxine (SYNTHROID) 100 MCG tablet   loperamide (IMODIUM A-D) 2 MG tablet   losartan (COZAAR) 100 MG tablet   metFORMIN (GLUCOPHAGE) 1000 MG tablet   nitroGLYCERIN (NITROSTAT) 0.4 MG SL tablet   Omega-3 Fatty Acids (FISH OIL) 1000 MG CAPS   pantoprazole (PROTONIX) 20 MG tablet   potassium citrate (UROCIT-K) 10 MEQ (1080 MG) SR tablet   rosuvastatin (CRESTOR) 10 MG tablet   traMADol (ULTRAM) 50 MG tablet   traZODone (DESYREL) 50 MG tablet   triamcinolone cream (KENALOG) 0.1 %   TRULICITY 3 MG/0.5ML SOAJ   vitamin B-12  (CYANOCOBALAMIN) 250 MCG tablet   No current facility-administered medications for this encounter.     Jodell Cipro Ward, PA-C [facility] Pre-Surgical Testing [phone]

## 2024-01-21 NOTE — Progress Notes (Signed)
 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 Appropriate notes have been placed on the patient's chart. This note has been routed to Dr.Olin and Rosalene Billings for review. The Patient's surgery is currently scheduled for:  01-28-2024 at [facility].  Rudean Haskell, BSN, CVRN-BC   Pre-Surgical Testing Nurse [facility]  [phone]

## 2024-01-27 NOTE — H&P (Addendum)
 TOTAL HIP ADMISSION H&P  Patient is admitted for left total hip arthroplasty.  Therapy Plans: HEP Disposition: Home with wife & son Planned DVT Prophylaxis: aspirin 81mg  BID DME needed: none PCP: Dr. Uvaldo Rising - clearance received Cardio: Dr. Swaziland, clearance received (will send for new note as its been 6 months) TXA: IV Allergies: morphine/percocet/vicodin - hallucinations, PCN - eye swelling within an hour of an injection in his 65s Anesthesia Concerns: Issues with general -- prefers spinal BMI: 29.2 Last HgbA1c: 6.8%  Other:  - tramadol, tylenol, methocarbamol, meloxicam - Has had prior TKAs and TSA - No hx of VTE - Hx of prostate cancer - 2006 s/p prostatectomy - has incontinence  Subjective:  Chief Complaint: left hip pain  HPI: Paul [name], 78 y.o. male, has a history of pain and functional disability in the left hip(s) due to arthritis and patient has failed non-surgical conservative treatments for greater than 12 weeks to include NSAID's and/or analgesics and activity modification.  Onset of symptoms was gradual starting 2 years ago with gradually worsening course since that time.The patient noted no past surgery on the left hip(s).  Patient currently rates pain in the left hip at 8 out of 10 with activity. Patient has worsening of pain with activity and weight bearing and pain that interfers with activities of daily living. Patient has evidence of joint space narrowing by imaging studies. This condition presents safety issues increasing the risk of falls.  There is no current active infection.  Patient Active Problem List   Diagnosis Date Noted   S/P shoulder replacement, right 06/23/2021   Malignant neoplasm of prostate (HCC) 06/26/2019   Coronary artery disease    Hypertensive heart disease    Hyperlipidemia    Type II diabetes mellitus (HCC)    CAD (coronary artery disease) of artery bypass graft 01/18/2016   Other forms of dyspnea 11/14/2015   Angina pectoris  (HCC) 09/19/2015   Abnormal nuclear stress test 09/19/2015   Depression 04/30/2013   Sleep apnea 04/30/2013   S/P cholecystectomy 07/11/2011   Renal colic 07/11/2011   Acute on chronic cholecystitis 05/08/2011   Hx of CABG 03/21/2011   Type II or unspecified type diabetes mellitus without mention of complication, uncontrolled 03/21/2011   Benign hypertensive heart disease without heart failure 03/21/2011   Dyslipidemia associated with type 2 diabetes mellitus (HCC) 03/21/2011   History of prostate cancer 03/21/2011   Past Medical History:  Diagnosis Date   Allergy    Anxiety    Arthritis    Complication of anesthesia    slow to wake up   Coronary artery disease    a. s/p CABG;  b. 08/2015 MV: inf/inflat infarct w/ new peri-infarct isch; c. LHC 11/16: pLAD 70, dLAD 50, D1 100, oRI 100, mLCx 60, OM2 99, pRCA 99 (L>R collats), S-RPDA 100, S-D1 ok, S-RI/OM2 mid 85 (PCI: Synergy DES), L-LAD ok, EF 55-65%.   Depression    Dyspnea    due to meds pt on per pt   GERD (gastroesophageal reflux disease)    Headache    ocular migraines   History of kidney stones    Hoarseness, chronic    due to sinus issues   Hyperlipidemia    Hypertension    Hypertensive heart disease    Hypothyroidism    Insomnia    Myocardial infarction (HCC)    Neuromuscular disorder (HCC)    polymyalgia rheumatica   OSA (obstructive sleep apnea)    no CPAP   Pneumonia  03/2023   Prostate cancer (HCC)    Type II diabetes mellitus (HCC)     Past Surgical History:  Procedure Laterality Date   CARDIAC CATHETERIZATION N/A 09/19/2015   Procedure: Coronary Stent Intervention;  Surgeon: Peter M Swaziland, MD;  Location: [facility] INVASIVE CV LAB;  Service: Cardiovascular;  Laterality: N/A;   CARDIAC CATHETERIZATION  09/19/2015   Procedure: Left Heart Cath and Cors/Grafts Angiography;  Surgeon: Peter M Swaziland, MD;  Location: [facility] INVASIVE CV LAB;  Service: Cardiovascular;;   CARPAL TUNNEL RELEASE Bilateral    CHOLECYSTECTOMY      Laparoscopic   CORONARY ARTERY BYPASS GRAFT     x5 vessel   EYE SURGERY Bilateral    cataract extraction   EYE SURGERY Left 2024   laser for retina tear   JOINT REPLACEMENT     right knee   LEFT HEART CATH AND CORS/GRAFTS ANGIOGRAPHY N/A 06/26/2017   Procedure: LEFT HEART CATH AND CORS/GRAFTS ANGIOGRAPHY;  Surgeon: Swaziland, Peter M, MD;  Location: [facility] INVASIVE CV LAB;  Service: Cardiovascular;  Laterality: N/A;   left knee replacement      PROSTATE SURGERY     REVERSE SHOULDER ARTHROPLASTY Right 06/23/2021   Procedure: REVERSE SHOULDER ARTHROPLASTY;  Surgeon: Beverely Low, MD;  Location: [facility] ORS;  Service: Orthopedics;  Laterality: Right;  with ISB    No current facility-administered medications for this encounter.   Current Outpatient Medications  Medication Sig Dispense Refill Last Dose/Taking   acetaminophen (TYLENOL) 650 MG CR tablet Take 1,300 mg by mouth every 8 (eight) hours as needed for pain.   Taking As Needed   amLODipine (NORVASC) 5 MG tablet Take 5 mg by mouth in the morning and at bedtime.   Taking   aspirin EC 81 MG tablet Take 81 mg by mouth daily.   Taking   carvedilol (COREG) 12.5 MG tablet Take 1 tablet by mouth twice daily 180 tablet 2 Taking   Cholecalciferol (VITAMIN D) 2000 units tablet Take 2,000 Units by mouth daily.   Taking   clindamycin (CLEOCIN) 150 MG capsule Take 300 mg by mouth See admin instructions. Take 300 mg 1 hour before dental procedure.   Taking   DULoxetine (CYMBALTA) 60 MG capsule Take 60 mg by mouth daily.   Taking   empagliflozin (JARDIANCE) 25 MG TABS tablet Take 25 mg by mouth daily.   Taking   EPINEPHrine 0.3 mg/0.3 mL IJ SOAJ injection Inject 0.3 mg into the muscle daily as needed for anaphylaxis.   Taking As Needed   fluticasone (FLONASE) 50 MCG/ACT nasal spray Place 2 sprays into both nostrils daily as needed for allergies or rhinitis.   Taking As Needed   glipiZIDE (GLUCOTROL) 5 MG tablet Take 2.5-5 mg by mouth See admin instructions.  5 mg in the morning, 2.5 mg in the evening   Taking   guaiFENesin (MUCINEX) 600 MG 12 hr tablet Take 600-1,200 mg by mouth 2 (two) times daily as needed (for sinus drainage/cough/congestion).   Taking As Needed   levothyroxine (SYNTHROID) 100 MCG tablet Take 100 mcg by mouth daily before breakfast.   Taking   loperamide (IMODIUM A-D) 2 MG tablet Take 2-4 mg by mouth 4 (four) times daily as needed for diarrhea or loose stools.   Taking As Needed   losartan (COZAAR) 100 MG tablet TAKE 1 TABLET BY MOUTH ONCE DAILY. 90 tablet 3 Taking   metFORMIN (GLUCOPHAGE) 1000 MG tablet Take 1 tablet (1,000 mg total) by mouth 2 (two) times daily.  Taking   nitroGLYCERIN (NITROSTAT) 0.4 MG SL tablet Place 1 tablet (0.4 mg total) under the tongue every 5 (five) minutes as needed for chest pain. 25 tablet 6 Taking As Needed   Omega-3 Fatty Acids (FISH OIL) 1000 MG CAPS Take 1,000 mg by mouth daily.   Taking   pantoprazole (PROTONIX) 20 MG tablet Take 1 tablet (20 mg total) by mouth daily. 90 tablet 0 Taking   potassium citrate (UROCIT-K) 10 MEQ (1080 MG) SR tablet Take 10 mEq by mouth daily.   Taking   rosuvastatin (CRESTOR) 10 MG tablet Take 1 tablet by mouth once daily 90 tablet 1 Taking   traMADol (ULTRAM) 50 MG tablet Take 50 mg by mouth 2 (two) times daily.   Taking   traZODone (DESYREL) 50 MG tablet Take 50 mg by mouth at bedtime.   Taking   triamcinolone cream (KENALOG) 0.1 % Apply 1 application topically daily as needed (psoriasis).   Taking As Needed   TRULICITY 3 MG/0.5ML SOAJ Inject 3 mg into the skin once a week.   Taking   vitamin B-12 (CYANOCOBALAMIN) 250 MCG tablet Take 250 mcg by mouth daily.   Taking   Continuous Blood Gluc Receiver (FREESTYLE LIBRE 2 READER) DEVI See admin instructions.      Continuous Blood Gluc Sensor (FREESTYLE LIBRE 2 SENSOR) MISC USE AS DIRECTED TO obtain BLOOD GLUCOSE FOUR TIMES DAILY      Allergies  Allergen Reactions   Brilinta [Ticagrelor] Shortness Of Breath   Yellow  Jacket Venom Anaphylaxis   Morphine Other (See Comments)    Hallucinations/nightmares when taking for greater than 24hrs.   Penicillins Other (See Comments)    "Eyes swell shut" Has patient had a PCN reaction causing immediate rash, facial/tongue/throat swelling, SOB or lightheadedness with hypotension:Yes Has patient had a PCN reaction causing severe rash involving mucus membranes or skin necrosis:No Has patient had a PCN reaction that required hospitalization: No Has patient had a PCN reaction occurring within the last 10 years: No If all of the above answers are "NO", then may proceed with Cephalosporin use.    Percocet [Oxycodone-Acetaminophen]     Nightmares/hallucinations   Vicodin [Hydrocodone-Acetaminophen] Other (See Comments)    Hallucinations/nightmares   Alprazolam Rash   Septra Ds [Sulfamethoxazole-Trimethoprim] Rash    Social History   Tobacco Use   Smoking status: Never   Smokeless tobacco: Never  Substance Use Topics   Alcohol use: No    Alcohol/week: 0.0 standard drinks of alcohol    Family History  Problem Relation Age of Onset   Heart disease Mother    Heart attack Father    Vascular Disease Sister      Review of Systems  Constitutional:  Negative for chills and fever.  Respiratory:  Negative for cough and shortness of breath.   Cardiovascular:  Negative for chest pain.  Gastrointestinal:  Negative for nausea and vomiting.  Musculoskeletal:  Positive for arthralgias.     Objective:  Physical Exam Well nourished and well developed. General: Alert and oriented x3, cooperative and pleasant, no acute distress. Head: normocephalic, atraumatic, neck supple. Eyes: EOMI.  Musculoskeletal: Left hip exam: Painful and limited left hip range of motion with hip flexion and internal rotation just beyond neutral with pelvic tilting and pain, external rotation to about 20 degrees with pain Active hip flexion with slight external rotation contracture with 5/5  strength Grossly neurovascularly intact distally with intact sensibility and reflexes distally   Calves soft and nontender. Motor function intact in  LE. Strength 5/5 LE bilaterally. Neuro: Distal pulses 2+. Sensation to light touch intact in LE.  Vital signs in last 24 hours:    Labs:   Estimated body mass index is 27.62 kg/m as calculated from the following:   Height as of 01/20/24: 5\' 9"  (1.753 m).   Weight as of 01/20/24: 84.8 kg.   Imaging Review Plain radiographs demonstrate severe degenerative joint disease of the left hip(s). The bone quality appears to be adequate for age and reported activity level.      Assessment/Plan:  End stage arthritis, left hip(s)  The patient history, physical examination, clinical judgement of the provider and imaging studies are consistent with end stage degenerative joint disease of the left hip(s) and total hip arthroplasty is deemed medically necessary. The treatment options including medical management, injection therapy, arthroscopy and arthroplasty were discussed at length. The risks and benefits of total hip arthroplasty were presented and reviewed. The risks due to aseptic loosening, infection, stiffness, dislocation/subluxation,  thromboembolic complications and other imponderables were discussed.  The patient acknowledged the explanation, agreed to proceed with the plan and consent was signed. Patient is being admitted for inpatient treatment for surgery, pain control, PT, OT, prophylactic antibiotics, VTE prophylaxis, progressive ambulation and ADL's and discharge planning.The patient is planning to be discharged  home.   Rosalene Billings, PA-C Orthopedic Surgery [facility] [place] [phone]

## 2024-01-28 ENCOUNTER — Other Ambulatory Visit: Payer: Self-pay

## 2024-01-28 ENCOUNTER — Encounter (HOSPITAL_COMMUNITY)
Admission: RE | Disposition: A | Discharge status: Home / Self Care | Payer: Self-pay | Source: Home / Self Care | Attending: Orthopedic Surgery

## 2024-01-28 ENCOUNTER — Ambulatory Visit (HOSPITAL_COMMUNITY): Payer: Self-pay | Admitting: Physician Assistant

## 2024-01-28 ENCOUNTER — Ambulatory Visit (HOSPITAL_COMMUNITY)

## 2024-01-28 ENCOUNTER — Observation Stay (HOSPITAL_COMMUNITY)
Admission: RE | Admit: 2024-01-28 | Discharge: 2024-01-29 | Disposition: A | Discharge status: Home / Self Care | Payer: PPO | Attending: Orthopedic Surgery | Admitting: Orthopedic Surgery

## 2024-01-28 ENCOUNTER — Encounter (HOSPITAL_COMMUNITY): Payer: Self-pay | Admitting: Orthopedic Surgery

## 2024-01-28 ENCOUNTER — Ambulatory Visit (HOSPITAL_COMMUNITY): Admitting: Certified Registered"

## 2024-01-28 ENCOUNTER — Observation Stay (HOSPITAL_COMMUNITY)

## 2024-01-28 DIAGNOSIS — Z951 Presence of aortocoronary bypass graft: Secondary | ICD-10-CM | POA: Insufficient documentation

## 2024-01-28 DIAGNOSIS — M1612 Unilateral primary osteoarthritis, left hip: Secondary | ICD-10-CM

## 2024-01-28 DIAGNOSIS — E039 Hypothyroidism, unspecified: Secondary | ICD-10-CM

## 2024-01-28 DIAGNOSIS — Z96642 Presence of left artificial hip joint: Secondary | ICD-10-CM | POA: Diagnosis not present

## 2024-01-28 DIAGNOSIS — Z471 Aftercare following joint replacement surgery: Secondary | ICD-10-CM | POA: Diagnosis not present

## 2024-01-28 DIAGNOSIS — Z7985 Long-term (current) use of injectable non-insulin antidiabetic drugs: Secondary | ICD-10-CM | POA: Insufficient documentation

## 2024-01-28 DIAGNOSIS — Z8546 Personal history of malignant neoplasm of prostate: Secondary | ICD-10-CM | POA: Diagnosis not present

## 2024-01-28 DIAGNOSIS — E119 Type 2 diabetes mellitus without complications: Secondary | ICD-10-CM | POA: Diagnosis not present

## 2024-01-28 DIAGNOSIS — Z79899 Other long term (current) drug therapy: Secondary | ICD-10-CM | POA: Diagnosis not present

## 2024-01-28 DIAGNOSIS — I119 Hypertensive heart disease without heart failure: Secondary | ICD-10-CM | POA: Insufficient documentation

## 2024-01-28 DIAGNOSIS — Z7984 Long term (current) use of oral hypoglycemic drugs: Secondary | ICD-10-CM | POA: Insufficient documentation

## 2024-01-28 DIAGNOSIS — I2510 Atherosclerotic heart disease of native coronary artery without angina pectoris: Secondary | ICD-10-CM

## 2024-01-28 DIAGNOSIS — I10 Essential (primary) hypertension: Secondary | ICD-10-CM | POA: Diagnosis not present

## 2024-01-28 DIAGNOSIS — Z96651 Presence of right artificial knee joint: Secondary | ICD-10-CM | POA: Insufficient documentation

## 2024-01-28 DIAGNOSIS — Z01818 Encounter for other preprocedural examination: Secondary | ICD-10-CM

## 2024-01-28 DIAGNOSIS — Z7982 Long term (current) use of aspirin: Secondary | ICD-10-CM | POA: Insufficient documentation

## 2024-01-28 DIAGNOSIS — Z96611 Presence of right artificial shoulder joint: Secondary | ICD-10-CM | POA: Insufficient documentation

## 2024-01-28 DIAGNOSIS — M1611 Unilateral primary osteoarthritis, right hip: Secondary | ICD-10-CM | POA: Diagnosis not present

## 2024-01-28 DIAGNOSIS — E785 Hyperlipidemia, unspecified: Secondary | ICD-10-CM | POA: Diagnosis not present

## 2024-01-28 HISTORY — PX: TOTAL HIP ARTHROPLASTY: SHX124

## 2024-01-28 LAB — HEMOGLOBIN A1C
Hgb A1c MFr Bld: 6 % — ABNORMAL HIGH (ref 4.8–5.6)
Mean Plasma Glucose: 125.5 mg/dL

## 2024-01-28 LAB — TYPE AND SCREEN
ABO/RH(D): A POS
Antibody Screen: NEGATIVE

## 2024-01-28 LAB — GLUCOSE, CAPILLARY
Glucose-Capillary: 108 mg/dL — ABNORMAL HIGH (ref 70–99)
Glucose-Capillary: 98 mg/dL (ref 70–99)
Glucose-Capillary: 208 mg/dL — ABNORMAL HIGH (ref 70–99)
Glucose-Capillary: 304 mg/dL — ABNORMAL HIGH (ref 70–99)

## 2024-01-28 LAB — ABO/RH: ABO/RH(D): A POS

## 2024-01-28 SURGERY — ARTHROPLASTY, HIP, TOTAL, ANTERIOR APPROACH
Anesthesia: Monitor Anesthesia Care | Site: Hip | Laterality: Left

## 2024-01-28 MED ORDER — METFORMIN HCL 500 MG PO TABS
1000.0000 mg | ORAL_TABLET | Freq: Two times a day (BID) | ORAL | Status: DC
  Administered 2024-01-29: 1000 mg via ORAL

## 2024-01-28 MED ORDER — SODIUM CHLORIDE 0.9% FLUSH: 3.0000 mL | INTRAVENOUS | Status: DC | PRN

## 2024-01-28 MED ORDER — DEXAMETHASONE SODIUM PHOSPHATE 10 MG/ML IJ SOLN
8.0000 mg | Freq: Once | INTRAMUSCULAR | Status: AC
  Administered 2024-01-28: 8 mg via INTRAVENOUS

## 2024-01-28 MED ORDER — BUPIVACAINE-EPINEPHRINE (PF) 0.25% -1:200000 IJ SOLN
INTRAMUSCULAR | Status: AC
  Filled 2024-01-28: qty 30

## 2024-01-28 MED ORDER — ACETAMINOPHEN 500 MG PO TABS
1000.0000 mg | ORAL_TABLET | Freq: Four times a day (QID) | ORAL | Status: DC
  Administered 2024-01-28 – 2024-01-29 (×3): 1000 mg via ORAL
  Filled 2024-01-28 (×3): qty 2

## 2024-01-28 MED ORDER — EMPAGLIFLOZIN 25 MG PO TABS
25.0000 mg | ORAL_TABLET | Freq: Every day | ORAL | Status: DC
  Administered 2024-01-29: 25 mg via ORAL
  Filled 2024-01-28: qty 1

## 2024-01-28 MED ORDER — PROPOFOL 500 MG/50ML IV EMUL
INTRAVENOUS | Status: DC | PRN
  Administered 2024-01-28: 200 ug/kg/min via INTRAVENOUS

## 2024-01-28 MED ORDER — SODIUM CHLORIDE (PF) 0.9 % IJ SOLN
INTRAMUSCULAR | Status: AC
  Filled 2024-01-28: qty 30

## 2024-01-28 MED ORDER — ORAL CARE MOUTH RINSE: 15.0000 mL | Freq: Once | OROMUCOSAL | Status: AC

## 2024-01-28 MED ORDER — ASPIRIN 81 MG PO CHEW
81.0000 mg | CHEWABLE_TABLET | Freq: Two times a day (BID) | ORAL | Status: DC
  Administered 2024-01-28 – 2024-01-29 (×2): 81 mg via ORAL
  Filled 2024-01-28 (×2): qty 1

## 2024-01-28 MED ORDER — ORAL CARE MOUTH RINSE: 15.0000 mL | OROMUCOSAL | Status: DC | PRN

## 2024-01-28 MED ORDER — METHOCARBAMOL 500 MG PO TABS
500.0000 mg | ORAL_TABLET | Freq: Four times a day (QID) | ORAL | Status: DC | PRN
  Administered 2024-01-28: 500 mg via ORAL
  Filled 2024-01-28: qty 1

## 2024-01-28 MED ORDER — POTASSIUM CITRATE ER 10 MEQ (1080 MG) PO TBCR
10.0000 meq | EXTENDED_RELEASE_TABLET | Freq: Every day | ORAL | Status: DC
  Administered 2024-01-29: 10 meq via ORAL
  Filled 2024-01-28: qty 1

## 2024-01-28 MED ORDER — CEFAZOLIN SODIUM-DEXTROSE 2-4 GM/100ML-% IV SOLN
2.0000 g | INTRAVENOUS | Status: AC
  Administered 2024-01-28: 2 g via INTRAVENOUS
  Filled 2024-01-28: qty 100

## 2024-01-28 MED ORDER — SODIUM CHLORIDE 0.9% FLUSH: 3.0000 mL | Freq: Two times a day (BID) | INTRAVENOUS | Status: DC

## 2024-01-28 MED ORDER — FENTANYL CITRATE (PF) 100 MCG/2ML IJ SOLN
INTRAMUSCULAR | Status: DC | PRN
  Administered 2024-01-28: 100 ug via INTRAVENOUS

## 2024-01-28 MED ORDER — 0.9 % SODIUM CHLORIDE (POUR BTL) OPTIME
TOPICAL | Status: DC | PRN
  Administered 2024-01-28: 1000 mL

## 2024-01-28 MED ORDER — FENTANYL CITRATE (PF) 100 MCG/2ML IJ SOLN
INTRAMUSCULAR | Status: AC
  Filled 2024-01-28: qty 2

## 2024-01-28 MED ORDER — INSULIN ASPART 100 UNIT/ML IJ SOLN
0.0000 [IU] | Freq: Three times a day (TID) | INTRAMUSCULAR | Status: DC
  Administered 2024-01-28: 5 [IU] via SUBCUTANEOUS
  Administered 2024-01-29: 3 [IU] via SUBCUTANEOUS

## 2024-01-28 MED ORDER — SENNA 8.6 MG PO TABS
2.0000 | ORAL_TABLET | Freq: Every day | ORAL | Status: DC
  Administered 2024-01-28: 17.2 mg via ORAL
  Filled 2024-01-28: qty 2

## 2024-01-28 MED ORDER — TRANEXAMIC ACID-NACL 1000-0.7 MG/100ML-% IV SOLN
1000.0000 mg | Freq: Once | INTRAVENOUS | Status: AC
  Administered 2024-01-28: 1000 mg via INTRAVENOUS
  Filled 2024-01-28: qty 100

## 2024-01-28 MED ORDER — TRANEXAMIC ACID-NACL 1000-0.7 MG/100ML-% IV SOLN
1000.0000 mg | INTRAVENOUS | Status: AC
  Administered 2024-01-28: 1000 mg via INTRAVENOUS
  Filled 2024-01-28: qty 100

## 2024-01-28 MED ORDER — SODIUM CHLORIDE (PF) 0.9 % IJ SOLN
INTRAMUSCULAR | Status: DC | PRN
  Administered 2024-01-28: 61 mL

## 2024-01-28 MED ORDER — LEVOTHYROXINE SODIUM 100 MCG PO TABS
100.0000 ug | ORAL_TABLET | Freq: Every day | ORAL | Status: DC
  Administered 2024-01-29: 100 ug via ORAL
  Filled 2024-01-28: qty 1

## 2024-01-28 MED ORDER — GLIPIZIDE 5 MG PO TABS
2.5000 mg | ORAL_TABLET | Freq: Two times a day (BID) | ORAL | Status: DC
  Administered 2024-01-29: 5 mg via ORAL
  Filled 2024-01-28: qty 1

## 2024-01-28 MED ORDER — POVIDONE-IODINE 10 % EX SWAB: 2.0000 | Freq: Once | CUTANEOUS | Status: DC

## 2024-01-28 MED ORDER — ONDANSETRON HCL 4 MG/2ML IJ SOLN: 4.0000 mg | Freq: Four times a day (QID) | INTRAMUSCULAR | Status: DC | PRN

## 2024-01-28 MED ORDER — DULOXETINE HCL 60 MG PO CPEP
60.0000 mg | ORAL_CAPSULE | Freq: Every day | ORAL | Status: DC
  Administered 2024-01-29: 60 mg via ORAL
  Filled 2024-01-28: qty 1

## 2024-01-28 MED ORDER — METOCLOPRAMIDE HCL 5 MG PO TABS: 5.0000 mg | ORAL_TABLET | Freq: Three times a day (TID) | ORAL | Status: DC | PRN

## 2024-01-28 MED ORDER — ONDANSETRON HCL 4 MG PO TABS: 4.0000 mg | ORAL_TABLET | Freq: Four times a day (QID) | ORAL | Status: DC | PRN

## 2024-01-28 MED ORDER — PANTOPRAZOLE SODIUM 20 MG PO TBEC
20.0000 mg | DELAYED_RELEASE_TABLET | Freq: Every day | ORAL | Status: DC
  Administered 2024-01-29: 20 mg via ORAL
  Filled 2024-01-28: qty 1

## 2024-01-28 MED ORDER — PHENYLEPHRINE 80 MCG/ML (10ML) SYRINGE FOR IV PUSH (FOR BLOOD PRESSURE SUPPORT): PREFILLED_SYRINGE | INTRAVENOUS | Status: DC | PRN

## 2024-01-28 MED ORDER — TRAMADOL HCL 50 MG PO TABS
50.0000 mg | ORAL_TABLET | Freq: Four times a day (QID) | ORAL | Status: DC | PRN
  Administered 2024-01-28 – 2024-01-29 (×2): 50 mg via ORAL
  Filled 2024-01-28 (×2): qty 1

## 2024-01-28 MED ORDER — METOCLOPRAMIDE HCL 5 MG/ML IJ SOLN: 5.0000 mg | Freq: Three times a day (TID) | INTRAMUSCULAR | Status: DC | PRN

## 2024-01-28 MED ORDER — CARVEDILOL 12.5 MG PO TABS
12.5000 mg | ORAL_TABLET | Freq: Two times a day (BID) | ORAL | Status: DC
  Administered 2024-01-28 – 2024-01-29 (×2): 12.5 mg via ORAL
  Filled 2024-01-28 (×2): qty 1

## 2024-01-28 MED ORDER — MUPIROCIN 2 % EX OINT: 1.0000 | TOPICAL_OINTMENT | Freq: Two times a day (BID) | CUTANEOUS | 0 refills | Status: AC

## 2024-01-28 MED ORDER — MENTHOL 3 MG MT LOZG: 1.0000 | LOZENGE | OROMUCOSAL | Status: DC | PRN

## 2024-01-28 MED ORDER — DEXAMETHASONE SODIUM PHOSPHATE 10 MG/ML IJ SOLN
10.0000 mg | Freq: Once | INTRAMUSCULAR | Status: AC
  Administered 2024-01-29: 10 mg via INTRAVENOUS
  Filled 2024-01-28: qty 1

## 2024-01-28 MED ORDER — DIPHENHYDRAMINE HCL 12.5 MG/5ML PO ELIX: 12.5000 mg | ORAL_SOLUTION | ORAL | Status: DC | PRN

## 2024-01-28 MED ORDER — LOSARTAN POTASSIUM 50 MG PO TABS
100.0000 mg | ORAL_TABLET | Freq: Every day | ORAL | Status: DC
  Administered 2024-01-29: 100 mg via ORAL
  Filled 2024-01-28: qty 2

## 2024-01-28 MED ORDER — GUAIFENESIN ER 600 MG PO TB12: 600.0000 mg | ORAL_TABLET | Freq: Two times a day (BID) | ORAL | Status: DC | PRN

## 2024-01-28 MED ORDER — CEFAZOLIN SODIUM-DEXTROSE 2-4 GM/100ML-% IV SOLN
2.0000 g | Freq: Four times a day (QID) | INTRAVENOUS | Status: AC
  Administered 2024-01-28 (×2): 2 g via INTRAVENOUS
  Filled 2024-01-28 (×2): qty 100

## 2024-01-28 MED ORDER — PHENOL 1.4 % MT LIQD: 1.0000 | OROMUCOSAL | Status: DC | PRN

## 2024-01-28 MED ORDER — TRAZODONE HCL 50 MG PO TABS
50.0000 mg | ORAL_TABLET | Freq: Every day | ORAL | Status: DC
  Administered 2024-01-28: 50 mg via ORAL
  Filled 2024-01-28: qty 1

## 2024-01-28 MED ORDER — INSULIN ASPART 100 UNIT/ML IJ SOLN: 0.0000 [IU] | INTRAMUSCULAR | Status: DC | PRN

## 2024-01-28 MED ORDER — DEXAMETHASONE SODIUM PHOSPHATE 10 MG/ML IJ SOLN
INTRAMUSCULAR | Status: AC
  Filled 2024-01-28: qty 1

## 2024-01-28 MED ORDER — FENTANYL CITRATE PF 50 MCG/ML IJ SOSY: 25.0000 ug | PREFILLED_SYRINGE | INTRAMUSCULAR | Status: DC | PRN

## 2024-01-28 MED ORDER — POLYETHYLENE GLYCOL 3350 17 G PO PACK
17.0000 g | PACK | Freq: Two times a day (BID) | ORAL | Status: DC
  Administered 2024-01-28 – 2024-01-29 (×2): 17 g via ORAL
  Filled 2024-01-28 (×2): qty 1

## 2024-01-28 MED ORDER — METHOCARBAMOL 1000 MG/10ML IJ SOLN: 500.0000 mg | Freq: Four times a day (QID) | INTRAMUSCULAR | Status: DC | PRN

## 2024-01-28 MED ORDER — STERILE WATER FOR IRRIGATION IR SOLN
Status: DC | PRN
  Administered 2024-01-28: 1000 mL

## 2024-01-28 MED ORDER — HYDROMORPHONE HCL 1 MG/ML IJ SOLN: 0.5000 mg | INTRAMUSCULAR | Status: DC | PRN

## 2024-01-28 MED ORDER — LACTATED RINGERS IV SOLN: INTRAVENOUS | Status: DC

## 2024-01-28 MED ORDER — ROSUVASTATIN CALCIUM 10 MG PO TABS
10.0000 mg | ORAL_TABLET | Freq: Every day | ORAL | Status: DC
  Administered 2024-01-29: 10 mg via ORAL
  Filled 2024-01-28: qty 1

## 2024-01-28 MED ORDER — ONDANSETRON HCL 4 MG/2ML IJ SOLN
INTRAMUSCULAR | Status: DC | PRN
  Administered 2024-01-28: 4 mg via INTRAVENOUS

## 2024-01-28 MED ORDER — VANCOMYCIN HCL IN DEXTROSE 1-5 GM/200ML-% IV SOLN
1000.0000 mg | INTRAVENOUS | Status: AC
  Administered 2024-01-28: 1000 mg via INTRAVENOUS
  Filled 2024-01-28: qty 200

## 2024-01-28 MED ORDER — ALUM & MAG HYDROXIDE-SIMETH 200-200-20 MG/5ML PO SUSP: 30.0000 mL | ORAL | Status: DC | PRN

## 2024-01-28 MED ORDER — KETOROLAC TROMETHAMINE 30 MG/ML IJ SOLN
INTRAMUSCULAR | Status: AC
  Filled 2024-01-28: qty 1

## 2024-01-28 MED ORDER — ONDANSETRON HCL 4 MG/2ML IJ SOLN
INTRAMUSCULAR | Status: AC
  Filled 2024-01-28: qty 2

## 2024-01-28 MED ORDER — PHENYLEPHRINE HCL-NACL 20-0.9 MG/250ML-% IV SOLN
INTRAVENOUS | Status: DC | PRN
  Administered 2024-01-28: 25 ug/min via INTRAVENOUS

## 2024-01-28 MED ORDER — PROPOFOL 10 MG/ML IV BOLUS
INTRAVENOUS | Status: DC | PRN
  Administered 2024-01-28: 30 mg via INTRAVENOUS

## 2024-01-28 MED ORDER — CHLORHEXIDINE GLUCONATE 0.12 % MT SOLN
15.0000 mL | Freq: Once | OROMUCOSAL | Status: AC
  Administered 2024-01-28: 15 mL via OROMUCOSAL

## 2024-01-28 MED ORDER — AMLODIPINE BESYLATE 5 MG PO TABS
5.0000 mg | ORAL_TABLET | Freq: Two times a day (BID) | ORAL | Status: DC
  Administered 2024-01-28 – 2024-01-29 (×2): 5 mg via ORAL
  Filled 2024-01-28 (×2): qty 1

## 2024-01-28 MED ORDER — CHLORHEXIDINE GLUCONATE 4 % EX SOLN
1.0000 | CUTANEOUS | 1 refills | Status: DC
Start: 2024-01-28 — End: 2024-07-16

## 2024-01-28 MED ORDER — BISACODYL 10 MG RE SUPP: 10.0000 mg | Freq: Every day | RECTAL | Status: DC | PRN

## 2024-01-28 MED ORDER — BUPIVACAINE IN DEXTROSE 0.75-8.25 % IT SOLN
INTRATHECAL | Status: DC | PRN
  Administered 2024-01-28: 1.8 mL via INTRATHECAL

## 2024-01-28 SURGICAL SUPPLY — 41 items
ARTICULEZE HEAD (Hips) ×1 IMPLANT
BAG COUNTER SPONGE SURGICOUNT (BAG) IMPLANT
BAG ZIPLOCK 12X15 (MISCELLANEOUS) IMPLANT
BLADE SAG 18X100X1.27 (BLADE) ×1 IMPLANT
COVER PERINEAL POST (MISCELLANEOUS) ×1 IMPLANT
COVER SURGICAL LIGHT HANDLE (MISCELLANEOUS) ×1 IMPLANT
CUP ACET PINNACLE SECTR 58MM (Hips) IMPLANT
DERMABOND ADVANCED .7 DNX12 (GAUZE/BANDAGES/DRESSINGS) ×1 IMPLANT
DRAPE FOOT SWITCH (DRAPES) ×1 IMPLANT
DRAPE STERI IOBAN 125X83 (DRAPES) ×1 IMPLANT
DRAPE U-SHAPE 47X51 STRL (DRAPES) ×2 IMPLANT
DRESSING AQUACEL AG SP 3.5X10 (GAUZE/BANDAGES/DRESSINGS) ×1 IMPLANT
DRSG AQUACEL AG ADV 3.5X10 (GAUZE/BANDAGES/DRESSINGS) IMPLANT
DRSG AQUACEL AG SP 3.5X10 (GAUZE/BANDAGES/DRESSINGS) ×1 IMPLANT
DURAPREP 26ML APPLICATOR (WOUND CARE) ×1 IMPLANT
ELECT REM PT RETURN 15FT ADLT (MISCELLANEOUS) ×1 IMPLANT
GLOVE BIO SURGEON STRL SZ 6 (GLOVE) ×1 IMPLANT
GLOVE BIOGEL PI IND STRL 6.5 (GLOVE) ×1 IMPLANT
GLOVE BIOGEL PI IND STRL 7.5 (GLOVE) ×1 IMPLANT
GLOVE ORTHO TXT STRL SZ7.5 (GLOVE) ×2 IMPLANT
GOWN STRL REUS W/ TWL LRG LVL3 (GOWN DISPOSABLE) ×2 IMPLANT
HEAD ARTICULEZE (Hips) IMPLANT
HOLDER FOLEY CATH W/STRAP (MISCELLANEOUS) ×1 IMPLANT
KIT TURNOVER KIT A (KITS) IMPLANT
LINER NEUTRAL 36X58 PLUS4 IMPLANT
MANIFOLD NEPTUNE II (INSTRUMENTS) ×1 IMPLANT
NDL SAFETY ECLIPSE 18X1.5 (NEEDLE) IMPLANT
PACK ANTERIOR HIP CUSTOM (KITS) ×1 IMPLANT
PINNACLE SECTOR CUP 58MM (Hips) ×1 IMPLANT
SCREW 6.5MMX30MM (Screw) IMPLANT
STEM FEMORAL SZ6 HIGH ACTIS (Stem) IMPLANT
SUT MNCRL AB 4-0 PS2 18 (SUTURE) ×1 IMPLANT
SUT STRATAFIX 0 PDS 27 VIOLET (SUTURE) ×1 IMPLANT
SUT VIC AB 1 CT1 36 (SUTURE) ×3 IMPLANT
SUT VIC AB 2-0 CT1 TAPERPNT 27 (SUTURE) ×2 IMPLANT
SUTURE STRATFX 0 PDS 27 VIOLET (SUTURE) ×1 IMPLANT
SYR 3ML LL SCALE MARK (SYRINGE) IMPLANT
TOWEL GREEN STERILE FF (TOWEL DISPOSABLE) ×1 IMPLANT
TRAY FOLEY MTR SLVR 16FR STAT (SET/KITS/TRAYS/PACK) ×1 IMPLANT
TUBE SUCTION HIGH CAP CLEAR NV (SUCTIONS) ×1 IMPLANT
WATER STERILE IRR 1000ML POUR (IV SOLUTION) ×1 IMPLANT

## 2024-01-28 NOTE — Discharge Instructions (Signed)

## 2024-01-28 NOTE — Interval H&P Note (Signed)
 History and Physical Interval Note:  01/28/2024 10:00 AM  Paul [name]  has presented today for surgery, with the diagnosis of Left hip osteoarthritis.  The various methods of treatment have been discussed with the patient and family. After consideration of risks, benefits and other options for treatment, the patient has consented to  Procedure(s): ARTHROPLASTY, HIP, TOTAL, ANTERIOR APPROACH (Left) as a surgical intervention.  The patient's history has been reviewed, patient examined, no change in status, stable for surgery.  I have reviewed the patient's chart and labs.  Questions were answered to the patient's satisfaction.     [name]

## 2024-01-28 NOTE — Transfer of Care (Signed)
 Immediate Anesthesia Transfer of Care Note  Patient: Paul [name] [facility]  Procedure(s) Performed: ARTHROPLASTY, HIP, TOTAL, ANTERIOR APPROACH (Left: Hip)  Patient Location: PACU  Anesthesia Type:Spinal  Level of Consciousness: awake, alert , and oriented  Airway & Oxygen Therapy: Patient Spontanous Breathing and Patient connected to face mask oxygen  Post-op Assessment: Report given to RN and Post -op Vital signs reviewed and stable  Post vital signs: Reviewed and stable  Last Vitals:  Vitals Value Taken Time  BP 112/63 01/28/24 1247  Temp    Pulse 53 01/28/24 1250  Resp 17 01/28/24 1250  SpO2 98 % 01/28/24 1250  Vitals shown include unfiled device data.  Last Pain:  Vitals:   01/28/24 0930  TempSrc:   PainSc: 0-No pain         Complications: No notable events documented.

## 2024-01-28 NOTE — Op Note (Signed)
 NAME:  Paul [name] [facility]                ACCOUNT NO.: [id number]      MEDICAL RECORD NO.: [id number]      FACILITY:  [facility]      PHYSICIAN:  [name]  DATE OF BIRTH:  1946/08/05     DATE OF PROCEDURE:  01/28/2024                                 OPERATIVE REPORT         PREOPERATIVE DIAGNOSIS: Left  hip osteoarthritis.      POSTOPERATIVE DIAGNOSIS:  Left hip osteoarthritis.      PROCEDURE:  Left total hip replacement through an anterior approach   utilizing DePuy THR system, component size 58 mm pinnacle cup, a size 36+4 neutral   Altrex liner, a size 7 Hi Actis stem with a 36+5 Articuleze metal head ball.      SURGEON:  Madlyn Frankel. Charlann Boxer, M.D.      ASSISTANT:  Rosalene Billings, PA-C     ANESTHESIA:  Spinal.      SPECIMENS:  None.      COMPLICATIONS:  None.      BLOOD LOSS:  300 cc     DRAINS:  None.      INDICATION OF THE PROCEDURE:  Paul [name] is a 78 y.o. male who had   presented to office for evaluation of left hip pain.  Radiographs revealed   progressive degenerative changes with bone-on-bone   articulation of the  hip joint, including subchondral cystic changes and osteophytes.  The patient had painful limited range of   motion significantly affecting their overall quality of life and function.  The patient was failing to    respond to conservative measures including medications and/or injections and activity modification and at this point was ready   to proceed with more definitive measures.  Consent was obtained for   benefit of pain relief.  Specific risks of infection, DVT, component   failure, dislocation, neurovascular injury, and need for revision surgery were reviewed in the office.     PROCEDURE IN DETAIL:  The patient was brought to operative theater.   Once adequate anesthesia, preoperative antibiotics, 2 gm of Ancef, 1 gm of Tranexamic Acid, and 10 mg of Decadron were administered, the patient was positioned supine on the  Paul [name] table.  Once the patient was safely positioned with adequate padding of boney prominences we predraped out the hip, and used fluoroscopy to confirm orientation of the pelvis.      The left hip was then prepped and draped from proximal iliac crest to   mid thigh with a shower curtain technique.      Time-out was performed identifying the patient, planned procedure, and the appropriate extremity.     An incision was then made 2 cm lateral to the   anterior superior iliac spine extending over the orientation of the   tensor fascia lata muscle and sharp dissection was carried down to the   fascia of the muscle.      The fascia was then incised.  The muscle belly was identified and swept   laterally and retractor placed along the superior neck.  Following   cauterization of the circumflex vessels and removing some pericapsular   fat, a second cobra retractor was placed on the inferior neck.  A T-capsulotomy was made along the line of the   superior neck to the trochanteric fossa, then extended proximally and   distally.  Tag sutures were placed and the retractors were then placed   intracapsular.  We then identified the trochanteric fossa and   orientation of my neck cut and then made a neck osteotomy with the femur on traction.  The femoral   head was removed without difficulty or complication.  Traction was let   off and retractors were placed posterior and anterior around the   acetabulum.      The labrum and foveal tissue were debrided.  I began reaming with a 51 mm   reamer and reamed up to 57 mm reamer with good bony bed preparation and a 58 mm  cup was chosen.  The final 58 mm Pinnacle cup was then impacted under fluoroscopy to confirm the depth of penetration and orientation with respect to   Abduction and forward flexion.  A screw was placed into the ilium followed by the hole eliminator.  The final   36+4 neutral Altrex liner was impacted with good visualized rim fit.  The  cup was positioned anatomically within the acetabular portion of the pelvis.      At this point, the femur was rolled to 100 degrees.  Further capsule was   released off the inferior aspect of the femoral neck.  I then   released the superior capsule proximally.  With the leg in a neutral position the hook was placed laterally   along the femur under the vastus lateralis origin and elevated manually and then held in position using the hook attachment on the bed.  The leg was then extended and adducted with the leg rolled to 100   degrees of external rotation.  Retractors were placed along the medial calcar and posteriorly over the greater trochanter.  Once the proximal femur was fully   exposed, I used a box osteotome to set orientation.  I then began   broaching with the starting chili pepper broach and passed this by hand and then broached up to 7.  With the 7 broach in place I chose a high offset neck and did several trial reductions.  The offset was appropriate, leg lengths   appeared to be equal best matched with the +5 head ball trial confirmed radiographically.   Given these findings, I went ahead and dislocated the hip, repositioned all   retractors and positioned the right hip in the extended and abducted position.  The final 7 Hi Actis stem was   chosen and it was impacted down to the level of neck cut.  Based on this   and the trial reductions, a final 36+5 Articuleze metal head ball was chosen and   impacted onto a clean and dry trunnion, and the hip was reduced.  The   hip had been irrigated throughout the case again at this point.  I did   reapproximate the superior capsular leaflet to the anterior leaflet   using #1 Vicryl.  The fascia of the   tensor fascia lata muscle was then reapproximated using #1 Vicryl and #0 Stratafix sutures.  The   remaining wound was closed with 2-0 Vicryl and running 4-0 Monocryl.   The hip was cleaned, dried, and dressed sterilely using Dermabond and    Aquacel dressing.  The patient was then brought   to recovery room in stable condition tolerating the procedure well.    Rosalene Billings,  PA-C was present for the entirety of the case involved from   preoperative positioning, perioperative retractor management, general   facilitation of the case, as well as primary wound closure as assistant.            Madlyn Frankel Charlann Boxer, M.D.        01/28/2024 10:00 AM

## 2024-01-28 NOTE — Care Plan (Signed)
 Ortho Bundle Case Management Note  Patient Details  Name: Paul [name] MRN: [id number] Date of Birth: 15-May-1946  L THA on 01/28/24  DCP: Home with wife DME: No needs. Has RW and cane.  PT: HEP                DME Arranged:  N/A DME Agency:  NA  HH Arranged:    HH Agency:     Additional Comments: Please contact me with any questions of if this plan should need to change.  Despina Pole, CCM [facility] [phone]   01/28/2024, 9:12 AM

## 2024-01-28 NOTE — Evaluation (Signed)
 Physical Therapy Evaluation Patient Details Name: Paul [name] MRN: [id number] DOB: 12-28-1945 Today's Date: 01/28/2024  History of Present Illness  Pt is 78 yo male admitted on 01/28/24 for L anterior THA.  Pt with hx including but not limited to R TSA, prostate CA, HLD, CAD, HTN, OSA, CABG, DM2, arthritis, Bil TKA  Clinical Impression  Pt is s/p THA resulting in the deficits listed below (see PT Problem List). At baseline, pt has been ambulating with a crutch for about 1 year due to his L hip pain.  He was still independent with adls and light iadls.  Pt has home support and DME.  Typically, enters home using 7 steps but reports can do 2 with 1 rail.  Today, pt with excellent pain control and able to ambulate 5' with RW and steady gait.  He was extremely pleased with his ability to walk and pain control compared to prior to surgery.  Pt expected to progress well with therapy.  Plan is for pt to return home with HEP.  Pt will benefit from acute skilled PT to increase their independence and safety with mobility to facilitate discharge.          If plan is discharge home, recommend the following: A little help with walking and/or transfers;A little help with bathing/dressing/bathroom;Assistance with cooking/housework;Help with stairs or ramp for entrance   Can travel by private vehicle        Equipment Recommendations None recommended by PT  Recommendations for Other Services       Functional Status Assessment Patient has had a recent decline in their functional status and demonstrates the ability to make significant improvements in function in a reasonable and predictable amount of time.     Precautions / Restrictions Precautions Precautions: Fall;Knee Restrictions Weight Bearing Restrictions Per Provider Order: Yes LLE Weight Bearing Per Provider Order: Weight bearing as tolerated      Mobility  Bed Mobility Overal bed mobility: Needs Assistance Bed Mobility: Supine to Sit      Supine to sit: Min assist     General bed mobility comments: light min A for L LE    Transfers Overall transfer level: Needs assistance Equipment used: Rolling walker (2 wheels) Transfers: Sit to/from Stand Sit to Stand: Contact guard assist           General transfer comment: CGA for safety, cues for hand placement    Ambulation/Gait Ambulation/Gait assistance: Contact guard assist Gait Distance (Feet): 80 Feet Assistive device: Rolling walker (2 wheels) Gait Pattern/deviations: Step-through pattern, Decreased stride length Gait velocity: near normal     General Gait Details: Cues for posture but otherwise pt with steady gait; reports pain so much better than prior to surgery; he was very happy  Stairs            Wheelchair Mobility     Tilt Bed    Modified Rankin (Stroke Patients Only)       Balance Overall balance assessment: Needs assistance Sitting-balance support: No upper extremity supported Sitting balance-Leahy Scale: Good     Standing balance support: Bilateral upper extremity supported, No upper extremity supported Standing balance-Leahy Scale: Fair Standing balance comment: RW to ambulate but could static stand wtihout AD                             Pertinent Vitals/Pain Pain Assessment Pain Assessment: 0-10 Pain Score: 5  Pain Location: Lhip Pain Descriptors / Indicators: Discomfort  Pain Intervention(s): Limited activity within patient's tolerance, Monitored during session, Premedicated before session, Repositioned    Home Living Family/patient expects to be discharged to:: Private residence Living Arrangements: Spouse/significant other;Children Available Help at Discharge: Family;Available 24 hours/day Type of Home: House Home Access: Stairs to enter   [employer] of Steps: Front porch 2 steps with rail on L   Home Layout: Two level;Able to live on main level with bedroom/bathroom Home Equipment:  Crutches;Rolling Walker (2 wheels);Cane - single point;Grab bars - toilet;Toilet riser      Prior Function Prior Level of Function : Independent/Modified Independent;Driving             Mobility Comments: Can ambulate in community; Was using a crutch for about a year due to pain       Extremity/Trunk Assessment   Upper Extremity Assessment Upper Extremity Assessment: Overall WFL for tasks assessed    Lower Extremity Assessment Lower Extremity Assessment: LLE deficits/detail;RLE deficits/detail RLE Deficits / Details: ROM WFL; MMT 5/5 RLE Sensation: WNL LLE Deficits / Details: Expected post op changes; ROM WFL; MMT ankle 5/5, knee 3/5 not further tested, hip 2/5 LLE Sensation: WNL    Cervical / Trunk Assessment Cervical / Trunk Assessment: Normal  Communication        Cognition Arousal: Alert Behavior During Therapy: WFL for tasks assessed/performed   PT - Cognitive impairments: No apparent impairments                                 Cueing       General Comments      Exercises     Assessment/Plan    PT Assessment Patient needs continued PT services  PT Problem List Decreased strength;Pain;Decreased range of motion;Decreased activity tolerance;Decreased balance;Decreased mobility;Decreased knowledge of use of DME       PT Treatment Interventions DME instruction;Therapeutic exercise;Gait training;Balance training;Stair training;Functional mobility training;Therapeutic activities;Patient/family education;Modalities    PT Goals (Current goals can be found in the Care Plan section)  Acute Rehab PT Goals Patient Stated Goal: return home PT Goal Formulation: With patient/family Time For Goal Achievement: 02/11/24 Potential to Achieve Goals: Good    Frequency 7X/week     Co-evaluation               AM-PAC PT "6 Clicks" Mobility  Outcome Measure Help needed turning from your back to your side while in a flat bed without using  bedrails?: A Little Help needed moving from lying on your back to sitting on the side of a flat bed without using bedrails?: A Little Help needed moving to and from a bed to a chair (including a wheelchair)?: A Little Help needed standing up from a chair using your arms (e.g., wheelchair or bedside chair)?: A Little Help needed to walk in hospital room?: A Little Help needed climbing 3-5 steps with a railing? : A Little 6 Click Score: 18    End of Session Equipment Utilized During Treatment: Gait belt Activity Tolerance: Patient tolerated treatment well Patient left: with chair alarm set;in chair;with call bell/phone within reach;with family/visitor present Nurse Communication: Mobility status PT Visit Diagnosis: Other abnormalities of gait and mobility (R26.89);Muscle weakness (generalized) (M62.81)    Time: 7829-5621 PT Time Calculation (min) (ACUTE ONLY): 17 min   Charges:   PT Evaluation $PT Eval Low Complexity: 1 Low   PT General Charges $$ ACUTE PT VISIT: 1 Visit  Anise Salvo, PT Acute Rehab [facility] [phone]   Rayetta Humphrey 01/28/2024, 4:50 PM

## 2024-01-28 NOTE — Anesthesia Preprocedure Evaluation (Signed)
 Anesthesia Evaluation  Patient identified by MRN, date of birth, ID band Patient awake    Reviewed: Allergy & Precautions, H&P , NPO status , Patient's Chart, lab work & pertinent test results  Airway Mallampati: II   Neck ROM: full    Dental   Pulmonary shortness of breath, sleep apnea    breath sounds clear to auscultation       Cardiovascular hypertension, + CAD, + Past MI, + Cardiac Stents and + CABG   Rhythm:regular Rate:Normal     Neuro/Psych  Headaches PSYCHIATRIC DISORDERS Anxiety Depression     Neuromuscular disease    GI/Hepatic ,GERD  ,,  Endo/Other  diabetes, Type 2Hypothyroidism    Renal/GU      Musculoskeletal  (+) Arthritis ,    Abdominal   Peds  Hematology   Anesthesia Other Findings   Reproductive/Obstetrics                             Anesthesia Physical Anesthesia Plan  ASA: 3  Anesthesia Plan: MAC and Spinal   Post-op Pain Management:    Induction: Intravenous  PONV Risk Score and Plan: 1 and Propofol infusion and Treatment may vary due to age or medical condition  Airway Management Planned: Simple Face Mask  Additional Equipment:   Intra-op Plan:   Post-operative Plan:   Informed Consent: I have reviewed the patients History and Physical, chart, labs and discussed the procedure including the risks, benefits and alternatives for the proposed anesthesia with the patient or authorized representative who has indicated his/her understanding and acceptance.     Dental advisory given  Plan Discussed with: CRNA, Anesthesiologist and Surgeon  Anesthesia Plan Comments:        Anesthesia Quick Evaluation

## 2024-01-28 NOTE — Anesthesia Procedure Notes (Addendum)
 Spinal  Patient location during procedure: OR End time: 01/28/2024 11:26 AM Reason for block: surgical anesthesia Staffing Performed: resident/CRNA  Resident/CRNA: Enriqueta Shutter D, CRNA Performed by: Karah Caruthers, Clinical cytogeneticist D, CRNA Authorized by: Achille Rich, MD   Preanesthetic Checklist Completed: patient identified, IV checked, site marked, risks and benefits discussed, surgical consent, monitors and equipment checked, pre-op evaluation and timeout performed Spinal Block Patient position: sitting Prep: DuraPrep Patient monitoring: heart rate, continuous pulse ox and blood pressure Approach: midline Location: L3-4 Injection technique: single-shot Needle Needle type: Pencan  Needle gauge: 24 G Needle length: 9 cm Assessment Sensory level: T6 Events: CSF return Additional Notes 2025-04-27  Lot# [id number]

## 2024-01-29 ENCOUNTER — Encounter (HOSPITAL_COMMUNITY): Payer: Self-pay | Admitting: Orthopedic Surgery

## 2024-01-29 DIAGNOSIS — M1612 Unilateral primary osteoarthritis, left hip: Secondary | ICD-10-CM | POA: Diagnosis not present

## 2024-01-29 LAB — CBC
HCT: 28.7 % — ABNORMAL LOW (ref 39.0–52.0)
Hemoglobin: 8.9 g/dL — ABNORMAL LOW (ref 13.0–17.0)
MCH: 26.6 pg (ref 26.0–34.0)
MCHC: 31 g/dL (ref 30.0–36.0)
MCV: 85.7 fL (ref 80.0–100.0)
Platelets: 255 10*3/uL (ref 150–400)
RBC: 3.35 MIL/uL — ABNORMAL LOW (ref 4.22–5.81)
RDW: 14.9 % (ref 11.5–15.5)
WBC: 11.8 10*3/uL — ABNORMAL HIGH (ref 4.0–10.5)
nRBC: 0 % (ref 0.0–0.2)

## 2024-01-29 LAB — BASIC METABOLIC PANEL WITH GFR
Anion gap: 12 (ref 5–15)
BUN: 25 mg/dL — ABNORMAL HIGH (ref 8–23)
CO2: 21 mmol/L — ABNORMAL LOW (ref 22–32)
Calcium: 8.9 mg/dL (ref 8.9–10.3)
Chloride: 102 mmol/L (ref 98–111)
Creatinine, Ser: 1.13 mg/dL (ref 0.61–1.24)
GFR, Estimated: >60 mL/min (ref >60)
Glucose, Bld: 237 mg/dL — ABNORMAL HIGH (ref 70–99)
Potassium: 4.7 mmol/L (ref 3.5–5.1)
Sodium: 135 mmol/L (ref 135–145)

## 2024-01-29 LAB — GLUCOSE, CAPILLARY: Glucose-Capillary: 180 mg/dL — ABNORMAL HIGH (ref 70–99)

## 2024-01-29 MED ORDER — POLYETHYLENE GLYCOL 3350 17 G PO PACK: 17.0000 g | PACK | Freq: Two times a day (BID) | ORAL | 0 refills | Status: DC

## 2024-01-29 MED ORDER — SENNA 8.6 MG PO TABS
2.0000 | ORAL_TABLET | Freq: Every day | ORAL | 0 refills | Status: AC
Start: 2024-01-29 — End: 2024-02-12

## 2024-01-29 MED ORDER — METHOCARBAMOL 500 MG PO TABS: 500.0000 mg | ORAL_TABLET | Freq: Four times a day (QID) | ORAL | 2 refills | Status: DC | PRN

## 2024-01-29 MED ORDER — ASPIRIN 81 MG PO CHEW: 81.0000 mg | CHEWABLE_TABLET | Freq: Two times a day (BID) | ORAL | 0 refills | Status: AC

## 2024-01-29 MED ORDER — TRAMADOL HCL 50 MG PO TABS: 50.0000 mg | ORAL_TABLET | Freq: Four times a day (QID) | ORAL | 0 refills | Status: DC | PRN

## 2024-01-29 NOTE — TOC Transition Note (Signed)
 Transition of Care [facility]) - Discharge Note   Patient Details  Name: Paul [name] MRN: [id number] Date of Birth: 04/19/1946  Transition of [facility]) CM/SW Contact:  Amada Jupiter, LCSW Phone Number: 01/29/2024, 10:17 AM   Clinical Narrative:     Met with pt who confirms he has needed DME in the home.  Plan for HEP.  No TOC needs.  Final next level of care: Home/Self Care Barriers to Discharge: No Barriers Identified   Patient Goals and CMS Choice Patient states their goals for this hospitalization and ongoing recovery are:: return home          Discharge Placement                       Discharge Plan and Services Additional resources added to the After Visit Summary for                  DME Arranged: N/A DME Agency: NA                  Social Drivers of Health (SDOH) Interventions SDOH Screenings   Food Insecurity: No Food Insecurity (01/28/2024)  Housing: Low Risk  (01/28/2024)  Transportation Needs: No Transportation Needs (01/28/2024)  Utilities: Not At Risk (01/28/2024)  Social Connections: Moderately Integrated (01/28/2024)  Tobacco Use: Low Risk  (01/28/2024)     Readmission Risk Interventions     No data to display

## 2024-01-29 NOTE — Progress Notes (Signed)
 Physical Therapy Treatment Patient Details Name: Paul [name] MRN: [id number] DOB: [birth year] Today's Date: 01/29/2024   History of Present Illness Pt is 78 yo male admitted on 01/28/24 for L anterior THA.  Pt with hx including but not limited to R TSA, prostate CA, HLD, CAD, HTN, OSA, CABG, DM2, arthritis, Bil TKA    PT Comments  Pt is POD # 1 with excellent progress.  He has good pain control and safety awareness.  He ambulated 150' x 2 with RW and performed stairs similar to home set up without difficulty.  Pt with good understanding of HEP and progression.  Plan is for home with HEP.  Pt demonstrates safe gait & transfers in order to return home from PT perspective once discharged by MD.  While in hospital, will continue to benefit from PT for skilled therapy to advance mobility and exercises.       If plan is discharge home, recommend the following: A little help with walking and/or transfers;A little help with bathing/dressing/bathroom;Assistance with cooking/housework;Help with stairs or ramp for entrance   Can travel by private vehicle        Equipment Recommendations  None recommended by PT    Recommendations for Other Services       Precautions / Restrictions Precautions Precautions: Fall;Knee Restrictions LLE Weight Bearing Per Provider Order: Weight bearing as tolerated     Mobility  Bed Mobility               General bed mobility comments: in chair; educated on bed mobility techniques or sleeping in recliner; also discussed positions of comfort    Transfers Overall transfer level: Needs assistance Equipment used: Rolling walker (2 wheels) Transfers: Sit to/from Stand Sit to Stand: Supervision           General transfer comment: Performed x 3 safely and wtihout difficulty    Ambulation/Gait Ambulation/Gait assistance: Supervision Gait Distance (Feet): 150 Feet (150'x2) Assistive device: Rolling walker (2 wheels) Gait Pattern/deviations:  Step-through pattern, Decreased stride length       General Gait Details: Near normal gait pattern wtih good speed for POD #1.  Good recall of RW proximity   Stairs Stairs: Yes Stairs assistance: Contact guard assist Stair Management: One rail Right, Step to pattern, Forwards Number of Stairs: 7 General stair comments: Pt performed wtihout difficulty and provided teach back on sequencing and correct rail use (opposite side)   Wheelchair Mobility     Tilt Bed    Modified Rankin (Stroke Patients Only)       Balance Overall balance assessment: Needs assistance Sitting-balance support: No upper extremity supported Sitting balance-Leahy Scale: Good     Standing balance support: Bilateral upper extremity supported, No upper extremity supported Standing balance-Leahy Scale: Fair Standing balance comment: RW to ambulate but could static stand wtihout AD                            Communication    Cognition Arousal: Alert Behavior During Therapy: WFL for tasks assessed/performed   PT - Cognitive impairments: No apparent impairments                                Cueing    Exercises Total Joint Exercises Ankle Circles/Pumps: AROM, Both, 10 reps, Supine Quad Sets: AROM, Both, 10 reps, Supine Heel Slides: AAROM, Left, 5 reps, Supine Hip ABduction/ADduction: AAROM, Left, 5 reps,  Supine Long Arc Quad: AROM, Left, 5 reps, Seated Other Exercises Other Exercises: L LE, AROM, 5 reps, holding counter with supervision: marching, hip abd, hip ext, h-s curl Other Exercises: educated on AAROM technqiues and correct form    General Comments   Educated on safe ice use, no pivots, car transfers, and TED hose during day. Also, encouraged walking every 1-2 hours during day for 5-10 mins. Educated on HEP with focus on mobility the first week. Discussed doing exercises within pain control and if pain increasing could decreased ROM, reps, and stop exercises as  needed. Encouraged to perform ankle pumps frequently for blood flow.  Discussed progression to cane or no AD after f/u with MD.        Pertinent Vitals/Pain Pain Assessment Pain Assessment: 0-10 Pain Score: 3  Pain Location: Lhip Pain Descriptors / Indicators: Discomfort Pain Intervention(s): Limited activity within patient's tolerance, Monitored during session, Premedicated before session, Repositioned, Ice applied    Home Living                          Prior Function            PT Goals (current goals can now be found in the care plan section) Progress towards PT goals: Progressing toward goals    Frequency    7X/week      PT Plan      Co-evaluation              AM-PAC PT "6 Clicks" Mobility   Outcome Measure  Help needed turning from your back to your side while in a flat bed without using bedrails?: A Little Help needed moving from lying on your back to sitting on the side of a flat bed without using bedrails?: A Little Help needed moving to and from a bed to a chair (including a wheelchair)?: A Little Help needed standing up from a chair using your arms (e.g., wheelchair or bedside chair)?: A Little Help needed to walk in hospital room?: A Little Help needed climbing 3-5 steps with a railing? : A Little 6 Click Score: 18    End of Session Equipment Utilized During Treatment: Gait belt Activity Tolerance: Patient tolerated treatment well Patient left: in chair;with call bell/phone within reach;with family/visitor present Nurse Communication: Mobility status PT Visit Diagnosis: Other abnormalities of gait and mobility (R26.89);Muscle weakness (generalized) (M62.81)     Time: 1010-1046 PT Time Calculation (min) (ACUTE ONLY): 36 min  Charges:    $Gait Training: 8-22 mins $Therapeutic Exercise: 8-22 mins PT General Charges $$ ACUTE PT VISIT: 1 Visit                     Anise Salvo, PT Acute Rehab [facility]  [phone]    Rayetta Humphrey 01/29/2024, 11:51 AM

## 2024-01-29 NOTE — Progress Notes (Signed)
 Subjective: 1 Day Post-Op Procedure(s) (LRB): ARTHROPLASTY, HIP, TOTAL, ANTERIOR APPROACH (Left) Patient reports pain as mild.   Patient seen in rounds with Dr. Charlann Boxer. Patient is resting in bed on exam this morning. No acute events overnight. Foley catheter removed. Patient ambulated 80 feet with PT yesterday. We will continue therapy today.   Objective: Vital signs in last 24 hours: Temp:  [97.5 F (36.4 C)-98.4 F (36.9 C)] 98.4 F (36.9 C) (04/02 0515) Pulse Rate:  [49-65] 65 (04/02 0515) Resp:  [10-18] 17 (04/02 0515) BP: (112-148)/(62-82) 122/63 (04/02 0515) SpO2:  [91 %-100 %] 93 % (04/02 0515) Weight:  [84.8 kg] 84.8 kg (04/01 0930)  Intake/Output from previous day:  Intake/Output Summary (Last 24 hours) at 01/29/2024 0747 Last data filed at 01/29/2024 0600 Gross per 24 hour  Intake 2280.21 ml  Output 2400 ml  Net -119.79 ml     Intake/Output this shift: No intake/output data recorded.  Labs: Recent Labs    01/29/24 0326  HGB 8.9*   Recent Labs    01/29/24 0326  WBC 11.8*  RBC 3.35*  HCT 28.7*  PLT 255   Recent Labs    01/29/24 0326  NA 135  K 4.7  CL 102  CO2 21*  BUN 25*  CREATININE 1.13  GLUCOSE 237*  CALCIUM 8.9   No results for input(s): "LABPT", "INR" in the last 72 hours.  Exam: General - Patient is Alert and Oriented Extremity - Neurologically intact Sensation intact distally Intact pulses distally Dorsiflexion/Plantar flexion intact Dressing - dressing C/D/I Motor Function - intact, moving foot and toes well on exam.   Past Medical History:  Diagnosis Date   Allergy    Anxiety    Arthritis    Complication of anesthesia    slow to wake up   Coronary artery disease    a. s/p CABG;  b. 08/2015 MV: inf/inflat infarct w/ new peri-infarct isch; c. LHC 11/16: pLAD 70, dLAD 50, D1 100, oRI 100, mLCx 60, OM2 99, pRCA 99 (L>R collats), S-RPDA 100, S-D1 ok, S-RI/OM2 mid 85 (PCI: Synergy DES), L-LAD ok, EF 55-65%.   Depression     Dyspnea    due to meds pt on per pt   GERD (gastroesophageal reflux disease)    Headache    ocular migraines   History of kidney stones    Hoarseness, chronic    due to sinus issues   Hyperlipidemia    Hypertension    Hypertensive heart disease    Hypothyroidism    Insomnia    Myocardial infarction [facility])    Neuromuscular disorder (HCC)    polymyalgia rheumatica   OSA (obstructive sleep apnea)    no CPAP   Pneumonia 03/2023   Prostate cancer (HCC)    Type II diabetes mellitus (HCC)     Assessment/Plan: 1 Day Post-Op Procedure(s) (LRB): ARTHROPLASTY, HIP, TOTAL, ANTERIOR APPROACH (Left) Principal Problem:   S/P total left hip arthroplasty  Estimated body mass index is 27.62 kg/m as calculated from the following:   Height as of this encounter: 5\' 9"  (1.753 m).   Weight as of this encounter: 84.8 kg. Advance diet Up with therapy D/C IV fluids  DVT Prophylaxis - Aspirin Weight bearing as tolerated.  Hgb stable at 8.9 this AM  Plan is to go Home after hospital stay. Plan for discharge today after meeting goals with therapy. Follow up in the office in 2 weeks.   Rosalene Billings, PA-C Orthopedic Surgery [phone] 01/29/2024, 7:47 AM

## 2024-01-29 NOTE — Plan of Care (Signed)
  Problem: Activity: Goal: Risk for activity intolerance will decrease Outcome: Progressing   Problem: Pain Managment: Goal: General experience of comfort will improve and/or be controlled Outcome: Progressing   Problem: Education: Goal: Ability to describe self-care measures that may prevent or decrease complications (Diabetes Survival Skills Education) will improve Outcome: Progressing

## 2024-01-30 ENCOUNTER — Encounter (HOSPITAL_COMMUNITY): Payer: Self-pay | Admitting: Orthopedic Surgery

## 2024-01-30 NOTE — Anesthesia Postprocedure Evaluation (Signed)
 Anesthesia Post Note  Patient: [name]  Procedure(s) Performed: ARTHROPLASTY, HIP, TOTAL, ANTERIOR APPROACH (Left: Hip)     Patient location during evaluation: PACU Anesthesia Type: MAC and Spinal Level of consciousness: oriented and awake and alert Pain management: pain level controlled Vital Signs Assessment: post-procedure vital signs reviewed and stable Respiratory status: spontaneous breathing, respiratory function stable and patient connected to nasal cannula oxygen Cardiovascular status: blood pressure returned to baseline and stable Postop Assessment: no headache, no backache and no apparent nausea or vomiting Anesthetic complications: no   No notable events documented.  Last Vitals:  Vitals:   01/29/24 0515 01/29/24 0954  BP: 122/63 123/69  Pulse: 65 69  Resp: 17 17  Temp: 36.9 C 37.1 C  SpO2: 93% 94%    Last Pain:  Vitals:   01/29/24 0954  TempSrc: Oral  PainSc:                  Paul [name] S

## 2024-02-04 DIAGNOSIS — R809 Proteinuria, unspecified: Secondary | ICD-10-CM | POA: Diagnosis not present

## 2024-02-04 DIAGNOSIS — E1165 Type 2 diabetes mellitus with hyperglycemia: Secondary | ICD-10-CM | POA: Diagnosis not present

## 2024-02-04 DIAGNOSIS — E039 Hypothyroidism, unspecified: Secondary | ICD-10-CM | POA: Diagnosis not present

## 2024-02-04 NOTE — Discharge Summary (Signed)
 Patient ID: Paul [name] MRN: [id number] DOB/AGE: Oct 17, 1946 78 y.o.  Admit date: 01/28/2024 Discharge date: 01/29/2024  Admission Diagnoses:  Left hip osteoarthritis  Discharge Diagnoses:  Principal Problem:   S/P total left hip arthroplasty   Past Medical History:  Diagnosis Date   Allergy    Anxiety    Arthritis    Complication of anesthesia    slow to wake up   Coronary artery disease    a. s/p CABG;  b. 08/2015 MV: inf/inflat infarct w/ new peri-infarct isch; c. LHC 11/16: pLAD 70, dLAD 50, D1 100, oRI 100, mLCx 60, OM2 99, pRCA 99 (L>R collats), S-RPDA 100, S-D1 ok, S-RI/OM2 mid 85 (PCI: Synergy DES), L-LAD ok, EF 55-65%.   Depression    Dyspnea    due to meds pt on per pt   GERD (gastroesophageal reflux disease)    Headache    ocular migraines   History of kidney stones    Hoarseness, chronic    due to sinus issues   Hyperlipidemia    Hypertension    Hypertensive heart disease    Hypothyroidism    Insomnia    Myocardial infarction [facility])    Neuromuscular disorder (HCC)    polymyalgia rheumatica   OSA (obstructive sleep apnea)    no CPAP   Pneumonia 03/2023   Prostate cancer (HCC)    Type II diabetes mellitus (HCC)     Surgeries: Procedure(s): ARTHROPLASTY, HIP, TOTAL, ANTERIOR APPROACH on 01/28/2024   Consultants:   Discharged Condition: Improved  Hospital Course: Paul [name] is an 78 y.o. male who was admitted 01/28/2024 for operative treatment ofS/P total left hip arthroplasty. Patient has severe unremitting pain that affects sleep, daily activities, and work/hobbies. After pre-op clearance the patient was taken to the operating room on 01/28/2024 and underwent  Procedure(s): ARTHROPLASTY, HIP, TOTAL, ANTERIOR APPROACH.    Patient was given perioperative antibiotics:  Anti-infectives (From admission, onward)    Start     Dose/Rate Route Frequency Ordered Stop   01/28/24 1800  ceFAZolin (ANCEF) IVPB 2g/100 mL premix        2 g 200 mL/hr over 30  Minutes Intravenous Every 6 hours 01/28/24 1447 01/29/24 0017   01/28/24 0930  ceFAZolin (ANCEF) IVPB 2g/100 mL premix        2 g 200 mL/hr over 30 Minutes Intravenous On call to O.R. 01/28/24 0920 01/28/24 1132   01/28/24 0930  vancomycin (VANCOCIN) IVPB 1000 mg/200 mL premix        1,000 mg 200 mL/hr over 60 Minutes Intravenous On call to O.R. 01/28/24 0920 01/28/24 1101        Patient was given sequential compression devices, early ambulation, and chemoprophylaxis to prevent DVT. Patient worked with PT and was meeting their goals regarding safe ambulation and transfers.  Patient benefited maximally from hospital stay and there were no complications.    Recent vital signs: No data found.   Recent laboratory studies: No results for input(s): "WBC", "HGB", "HCT", "PLT", "NA", "K", "CL", "CO2", "BUN", "CREATININE", "GLUCOSE", "INR", "CALCIUM" in the last 72 hours.  Invalid input(s): "PT", "2"   Discharge Medications:   Allergies as of 01/29/2024       Reactions   Brilinta [ticagrelor] Shortness Of Breath   Yellow Jacket Venom Anaphylaxis   Morphine Other (See Comments)   Hallucinations/nightmares when taking for greater than 24hrs.   Penicillins Other (See Comments)   "Eyes swell shut" Has patient had a PCN reaction causing immediate rash, facial/tongue/throat  swelling, SOB or lightheadedness with hypotension:Yes Has patient had a PCN reaction causing severe rash involving mucus membranes or skin necrosis:No Has patient had a PCN reaction that required hospitalization: No Has patient had a PCN reaction occurring within the last 10 years: No If all of the above answers are "NO", then may proceed with Cephalosporin use. Tolerated Cephalosporin Date: 01/29/24.   Percocet [oxycodone-acetaminophen]    Nightmares/hallucinations   Vicodin [hydrocodone-acetaminophen] Other (See Comments)   Hallucinations/nightmares   Alprazolam Rash   Septra Ds [sulfamethoxazole-trimethoprim] Rash         Medication List     STOP taking these medications    aspirin EC 81 MG tablet Replaced by: aspirin 81 MG chewable tablet       TAKE these medications    acetaminophen 650 MG CR tablet Commonly known as: TYLENOL Take 1,300 mg by mouth every 8 (eight) hours as needed for pain.   amLODipine 5 MG tablet Commonly known as: NORVASC Take 5 mg by mouth in the morning and at bedtime.   aspirin 81 MG chewable tablet Chew 1 tablet (81 mg total) by mouth 2 (two) times daily for 28 days. Replaces: aspirin EC 81 MG tablet   carvedilol 12.5 MG tablet Commonly known as: COREG Take 1 tablet by mouth twice daily   chlorhexidine 4 % external liquid Commonly known as: HIBICLENS Apply 15 mLs (1 Application total) topically as directed for 30 doses. Use as directed daily for 5 days every other week for 6 weeks.   clindamycin 150 MG capsule Commonly known as: CLEOCIN Take 300 mg by mouth See admin instructions. Take 300 mg 1 hour before dental procedure.   DULoxetine 60 MG capsule Commonly known as: CYMBALTA Take 60 mg by mouth daily.   empagliflozin 25 MG Tabs tablet Commonly known as: JARDIANCE Take 25 mg by mouth daily.   EPINEPHrine 0.3 mg/0.3 mL Soaj injection Commonly known as: EPI-PEN Inject 0.3 mg into the muscle daily as needed for anaphylaxis.   Fish Oil 1000 MG Caps Take 1,000 mg by mouth daily.   fluticasone 50 MCG/ACT nasal spray Commonly known as: FLONASE Place 2 sprays into both nostrils daily as needed for allergies or rhinitis.   FreeStyle Ballard 2 Reader Hardie Pulley See admin instructions.   FreeStyle Libre 2 Sensor Misc USE AS DIRECTED TO obtain BLOOD GLUCOSE FOUR TIMES DAILY   glipiZIDE 5 MG tablet Commonly known as: GLUCOTROL Take 2.5-5 mg by mouth See admin instructions. 5 mg in the morning, 2.5 mg in the evening   guaiFENesin 600 MG 12 hr tablet Commonly known as: MUCINEX Take 600-1,200 mg by mouth 2 (two) times daily as needed (for sinus  drainage/cough/congestion).   levothyroxine 100 MCG tablet Commonly known as: SYNTHROID Take 100 mcg by mouth daily before breakfast.   loperamide 2 MG tablet Commonly known as: IMODIUM A-D Take 2-4 mg by mouth 4 (four) times daily as needed for diarrhea or loose stools.   losartan 100 MG tablet Commonly known as: COZAAR TAKE 1 TABLET BY MOUTH ONCE DAILY.   metFORMIN 1000 MG tablet Commonly known as: GLUCOPHAGE Take 1 tablet (1,000 mg total) by mouth 2 (two) times daily.   methocarbamol 500 MG tablet Commonly known as: ROBAXIN Take 1 tablet (500 mg total) by mouth every 6 (six) hours as needed for muscle spasms.   mupirocin ointment 2 % Commonly known as: BACTROBAN Place 1 Application into the nose 2 (two) times daily for 60 doses. Use as directed 2 times daily  for 5 days every other week for 6 weeks.   nitroGLYCERIN 0.4 MG SL tablet Commonly known as: NITROSTAT Place 1 tablet (0.4 mg total) under the tongue every 5 (five) minutes as needed for chest pain.   pantoprazole 20 MG tablet Commonly known as: Protonix Take 1 tablet (20 mg total) by mouth daily.   polyethylene glycol 17 g packet Commonly known as: MIRALAX / GLYCOLAX Take 17 g by mouth 2 (two) times daily.   potassium citrate 10 MEQ (1080 MG) SR tablet Commonly known as: UROCIT-K Take 10 mEq by mouth daily.   rosuvastatin 10 MG tablet Commonly known as: CRESTOR Take 1 tablet by mouth once daily   senna 8.6 MG Tabs tablet Commonly known as: SENOKOT Take 2 tablets (17.2 mg total) by mouth at bedtime for 14 days.   traMADol 50 MG tablet Commonly known as: ULTRAM Take 1 tablet (50 mg total) by mouth every 6 (six) hours as needed for moderate pain (pain score 4-6) or severe pain (pain score 7-10). What changed:  when to take this reasons to take this   traZODone 50 MG tablet Commonly known as: DESYREL Take 50 mg by mouth at bedtime.   triamcinolone cream 0.1 % Commonly known as: KENALOG Apply 1  application topically daily as needed (psoriasis).   Trulicity 3 MG/0.5ML Soaj Generic drug: Dulaglutide Inject 3 mg into the skin once a week.   vitamin B-12 250 MCG tablet Commonly known as: CYANOCOBALAMIN Take 250 mcg by mouth daily.   Vitamin D 50 MCG (2000 UT) tablet Take 2,000 Units by mouth daily.               Discharge Care Instructions  (From admission, onward)           Start     Ordered   01/29/24 0000  Change dressing       Comments: Maintain surgical dressing until follow up in the clinic. If the edges start to pull up, may reinforce with tape. If the dressing is no longer working, may remove and cover with gauze and tape, but must keep the area dry and clean.  Call with any questions or concerns.   01/29/24 0752            Diagnostic Studies: DG Pelvis Portable Result Date: 01/28/2024 CLINICAL DATA:  Post hip arthroplasty EXAM: PORTABLE PELVIS 1-2 VIEWS COMPARISON:  None Available. FINDINGS: Changes of left hip replacement. No hardware bony complicating feature. Moderate to advanced degenerative changes in the right hip. SI joints symmetric. IMPRESSION: Left hip replacement.  No complicating feature. Electronically Signed   By: Charlett Nose M.D.   On: 01/28/2024 17:30   DG HIP PORT UNILAT WITH PELVIS 1V LEFT Result Date: 01/28/2024 CLINICAL DATA:  Left hip arthroplasty. EXAM: DG HIP (WITH OR WITHOUT PELVIS) 1V PORT LEFT COMPARISON:  None Available. FINDINGS: Two intraoperative fluoroscopic spot images provided. The total fluoroscopic time is 18 seconds with a cumulative air Karma of 2.5697 mGy. Total left hip arthroplasty. IMPRESSION: Intraoperative fluoroscopic spot images. Electronically Signed   By: Elgie Collard M.D.   On: 01/28/2024 16:25   DG C-Arm 1-60 Min-No Report Result Date: 01/28/2024 Fluoroscopy was utilized by the requesting physician.  No radiographic interpretation.    Disposition: Discharge disposition: 01-Home or Self  Care       Discharge Instructions     Call MD / Call 911   Complete by: As directed    If you experience chest pain or shortness  of breath, CALL 911 and be transported to the hospital emergency room.  If you develope a fever above 101 F, pus (white drainage) or increased drainage or redness at the wound, or calf pain, call your surgeon's office.   Change dressing   Complete by: As directed    Maintain surgical dressing until follow up in the clinic. If the edges start to pull up, may reinforce with tape. If the dressing is no longer working, may remove and cover with gauze and tape, but must keep the area dry and clean.  Call with any questions or concerns.   Constipation Prevention   Complete by: As directed    Drink plenty of fluids.  Prune juice may be helpful.  You may use a stool softener, such as Colace (over the counter) 100 mg twice a day.  Use MiraLax (over the counter) for constipation as needed.   Diet - low sodium heart healthy   Complete by: As directed    Increase activity slowly as tolerated   Complete by: As directed    Weight bearing as tolerated with assist device (walker, cane, etc) as directed, use it as long as suggested by your surgeon or therapist, typically at least 4-6 weeks.   Post-operative opioid taper instructions:   Complete by: As directed    POST-OPERATIVE OPIOID TAPER INSTRUCTIONS: It is important to wean off of your opioid medication as soon as possible. If you do not need pain medication after your surgery it is ok to stop day one. Opioids include: Codeine, Hydrocodone(Norco, Vicodin), Oxycodone(Percocet, oxycontin) and hydromorphone amongst others.  Long term and even short term use of opiods can cause: Increased pain response Dependence Constipation Depression Respiratory depression And more.  Withdrawal symptoms can include Flu like symptoms Nausea, vomiting And more Techniques to manage these symptoms Hydrate well Eat regular healthy  meals Stay active Use relaxation techniques(deep breathing, meditating, yoga) Do Not substitute Alcohol to help with tapering If you have been on opioids for less than two weeks and do not have pain than it is ok to stop all together.  Plan to wean off of opioids This plan should start within one week post op of your joint replacement. Maintain the same interval or time between taking each dose and first decrease the dose.  Cut the total daily intake of opioids by one tablet each day Next start to increase the time between doses. The last dose that should be eliminated is the evening dose.      TED hose   Complete by: As directed    Use stockings (TED hose) for 2 weeks on both leg(s).  You may remove them at night for sleeping.        Follow-up Information     Cassandria Anger, PA-C. Go on 02/12/2024.   Specialty: Orthopedic Surgery Why: You are scheduled for a post op appointment on Wednesday 02/12/24 at 2:00pm. Contact information: [address] [place] [address] [phone]                  Signed: Cassandria Anger 02/04/2024, 7:10 AM

## 2024-02-10 DIAGNOSIS — F331 Major depressive disorder, recurrent, moderate: Secondary | ICD-10-CM | POA: Diagnosis not present

## 2024-02-10 DIAGNOSIS — I10 Essential (primary) hypertension: Secondary | ICD-10-CM | POA: Diagnosis not present

## 2024-02-10 DIAGNOSIS — Z6827 Body mass index (BMI) 27.0-27.9, adult: Secondary | ICD-10-CM | POA: Diagnosis not present

## 2024-02-10 DIAGNOSIS — E039 Hypothyroidism, unspecified: Secondary | ICD-10-CM | POA: Diagnosis not present

## 2024-02-10 DIAGNOSIS — E785 Hyperlipidemia, unspecified: Secondary | ICD-10-CM | POA: Diagnosis not present

## 2024-02-10 DIAGNOSIS — E1122 Type 2 diabetes mellitus with diabetic chronic kidney disease: Secondary | ICD-10-CM | POA: Diagnosis not present

## 2024-02-10 DIAGNOSIS — Z96642 Presence of left artificial hip joint: Secondary | ICD-10-CM | POA: Diagnosis not present

## 2024-02-10 DIAGNOSIS — I25118 Atherosclerotic heart disease of native coronary artery with other forms of angina pectoris: Secondary | ICD-10-CM | POA: Diagnosis not present

## 2024-02-10 DIAGNOSIS — G4733 Obstructive sleep apnea (adult) (pediatric): Secondary | ICD-10-CM | POA: Diagnosis not present

## 2024-02-10 DIAGNOSIS — E1165 Type 2 diabetes mellitus with hyperglycemia: Secondary | ICD-10-CM | POA: Diagnosis not present

## 2024-02-10 DIAGNOSIS — K219 Gastro-esophageal reflux disease without esophagitis: Secondary | ICD-10-CM | POA: Diagnosis not present

## 2024-02-10 DIAGNOSIS — E1142 Type 2 diabetes mellitus with diabetic polyneuropathy: Secondary | ICD-10-CM | POA: Diagnosis not present

## 2024-02-25 DIAGNOSIS — E119 Type 2 diabetes mellitus without complications: Secondary | ICD-10-CM | POA: Diagnosis not present

## 2024-02-26 DIAGNOSIS — H35372 Puckering of macula, left eye: Secondary | ICD-10-CM | POA: Diagnosis not present

## 2024-02-26 DIAGNOSIS — H35363 Drusen (degenerative) of macula, bilateral: Secondary | ICD-10-CM | POA: Diagnosis not present

## 2024-02-26 DIAGNOSIS — H34212 Partial retinal artery occlusion, left eye: Secondary | ICD-10-CM | POA: Diagnosis not present

## 2024-02-26 DIAGNOSIS — H353131 Nonexudative age-related macular degeneration, bilateral, early dry stage: Secondary | ICD-10-CM | POA: Diagnosis not present

## 2024-02-26 DIAGNOSIS — H59812 Chorioretinal scars after surgery for detachment, left eye: Secondary | ICD-10-CM | POA: Diagnosis not present

## 2024-03-20 DIAGNOSIS — Z96642 Presence of left artificial hip joint: Secondary | ICD-10-CM | POA: Diagnosis not present

## 2024-03-20 DIAGNOSIS — Z471 Aftercare following joint replacement surgery: Secondary | ICD-10-CM | POA: Diagnosis not present

## 2024-03-30 DIAGNOSIS — E1122 Type 2 diabetes mellitus with diabetic chronic kidney disease: Secondary | ICD-10-CM | POA: Diagnosis not present

## 2024-03-30 DIAGNOSIS — E1165 Type 2 diabetes mellitus with hyperglycemia: Secondary | ICD-10-CM | POA: Diagnosis not present

## 2024-03-30 DIAGNOSIS — I25118 Atherosclerotic heart disease of native coronary artery with other forms of angina pectoris: Secondary | ICD-10-CM | POA: Diagnosis not present

## 2024-03-30 DIAGNOSIS — I10 Essential (primary) hypertension: Secondary | ICD-10-CM | POA: Diagnosis not present

## 2024-04-27 DIAGNOSIS — M1612 Unilateral primary osteoarthritis, left hip: Secondary | ICD-10-CM | POA: Diagnosis not present

## 2024-04-27 DIAGNOSIS — C61 Malignant neoplasm of prostate: Secondary | ICD-10-CM | POA: Diagnosis not present

## 2024-04-27 DIAGNOSIS — I25118 Atherosclerotic heart disease of native coronary artery with other forms of angina pectoris: Secondary | ICD-10-CM | POA: Diagnosis not present

## 2024-04-27 DIAGNOSIS — F331 Major depressive disorder, recurrent, moderate: Secondary | ICD-10-CM | POA: Diagnosis not present

## 2024-04-27 DIAGNOSIS — I10 Essential (primary) hypertension: Secondary | ICD-10-CM | POA: Diagnosis not present

## 2024-04-27 DIAGNOSIS — E1165 Type 2 diabetes mellitus with hyperglycemia: Secondary | ICD-10-CM | POA: Diagnosis not present

## 2024-04-27 DIAGNOSIS — E1122 Type 2 diabetes mellitus with diabetic chronic kidney disease: Secondary | ICD-10-CM | POA: Diagnosis not present

## 2024-05-05 DIAGNOSIS — E1122 Type 2 diabetes mellitus with diabetic chronic kidney disease: Secondary | ICD-10-CM | POA: Diagnosis not present

## 2024-05-05 DIAGNOSIS — E039 Hypothyroidism, unspecified: Secondary | ICD-10-CM | POA: Diagnosis not present

## 2024-05-11 DIAGNOSIS — D509 Iron deficiency anemia, unspecified: Secondary | ICD-10-CM | POA: Diagnosis not present

## 2024-05-11 DIAGNOSIS — E039 Hypothyroidism, unspecified: Secondary | ICD-10-CM | POA: Diagnosis not present

## 2024-05-11 DIAGNOSIS — F331 Major depressive disorder, recurrent, moderate: Secondary | ICD-10-CM | POA: Diagnosis not present

## 2024-05-11 DIAGNOSIS — G4733 Obstructive sleep apnea (adult) (pediatric): Secondary | ICD-10-CM | POA: Diagnosis not present

## 2024-05-11 DIAGNOSIS — I951 Orthostatic hypotension: Secondary | ICD-10-CM | POA: Diagnosis not present

## 2024-05-11 DIAGNOSIS — I10 Essential (primary) hypertension: Secondary | ICD-10-CM | POA: Diagnosis not present

## 2024-05-11 DIAGNOSIS — K219 Gastro-esophageal reflux disease without esophagitis: Secondary | ICD-10-CM | POA: Diagnosis not present

## 2024-05-11 DIAGNOSIS — E785 Hyperlipidemia, unspecified: Secondary | ICD-10-CM | POA: Diagnosis not present

## 2024-05-11 DIAGNOSIS — Z6827 Body mass index (BMI) 27.0-27.9, adult: Secondary | ICD-10-CM | POA: Diagnosis not present

## 2024-05-11 DIAGNOSIS — I25118 Atherosclerotic heart disease of native coronary artery with other forms of angina pectoris: Secondary | ICD-10-CM | POA: Diagnosis not present

## 2024-05-11 DIAGNOSIS — E1165 Type 2 diabetes mellitus with hyperglycemia: Secondary | ICD-10-CM | POA: Diagnosis not present

## 2024-05-11 DIAGNOSIS — E1122 Type 2 diabetes mellitus with diabetic chronic kidney disease: Secondary | ICD-10-CM | POA: Diagnosis not present

## 2024-05-11 DIAGNOSIS — E11649 Type 2 diabetes mellitus with hypoglycemia without coma: Secondary | ICD-10-CM | POA: Diagnosis not present

## 2024-05-17 DIAGNOSIS — I25118 Atherosclerotic heart disease of native coronary artery with other forms of angina pectoris: Secondary | ICD-10-CM | POA: Diagnosis not present

## 2024-05-17 DIAGNOSIS — I10 Essential (primary) hypertension: Secondary | ICD-10-CM | POA: Diagnosis not present

## 2024-05-17 DIAGNOSIS — E1122 Type 2 diabetes mellitus with diabetic chronic kidney disease: Secondary | ICD-10-CM | POA: Diagnosis not present

## 2024-05-17 DIAGNOSIS — E1165 Type 2 diabetes mellitus with hyperglycemia: Secondary | ICD-10-CM | POA: Diagnosis not present

## 2024-05-28 DIAGNOSIS — F331 Major depressive disorder, recurrent, moderate: Secondary | ICD-10-CM | POA: Diagnosis not present

## 2024-05-28 DIAGNOSIS — E1165 Type 2 diabetes mellitus with hyperglycemia: Secondary | ICD-10-CM | POA: Diagnosis not present

## 2024-05-28 DIAGNOSIS — M1612 Unilateral primary osteoarthritis, left hip: Secondary | ICD-10-CM | POA: Diagnosis not present

## 2024-05-28 DIAGNOSIS — C61 Malignant neoplasm of prostate: Secondary | ICD-10-CM | POA: Diagnosis not present

## 2024-05-28 DIAGNOSIS — I10 Essential (primary) hypertension: Secondary | ICD-10-CM | POA: Diagnosis not present

## 2024-05-28 DIAGNOSIS — E1122 Type 2 diabetes mellitus with diabetic chronic kidney disease: Secondary | ICD-10-CM | POA: Diagnosis not present

## 2024-05-28 DIAGNOSIS — I25118 Atherosclerotic heart disease of native coronary artery with other forms of angina pectoris: Secondary | ICD-10-CM | POA: Diagnosis not present

## 2024-06-09 DIAGNOSIS — M75122 Complete rotator cuff tear or rupture of left shoulder, not specified as traumatic: Secondary | ICD-10-CM | POA: Diagnosis not present

## 2024-06-09 DIAGNOSIS — Z96611 Presence of right artificial shoulder joint: Secondary | ICD-10-CM | POA: Diagnosis not present

## 2024-06-10 DIAGNOSIS — M25511 Pain in right shoulder: Secondary | ICD-10-CM | POA: Diagnosis not present

## 2024-06-12 DIAGNOSIS — Z96611 Presence of right artificial shoulder joint: Secondary | ICD-10-CM | POA: Diagnosis not present

## 2024-06-16 DIAGNOSIS — E1122 Type 2 diabetes mellitus with diabetic chronic kidney disease: Secondary | ICD-10-CM | POA: Diagnosis not present

## 2024-06-16 DIAGNOSIS — I25118 Atherosclerotic heart disease of native coronary artery with other forms of angina pectoris: Secondary | ICD-10-CM | POA: Diagnosis not present

## 2024-06-16 DIAGNOSIS — I10 Essential (primary) hypertension: Secondary | ICD-10-CM | POA: Diagnosis not present

## 2024-06-16 DIAGNOSIS — E1165 Type 2 diabetes mellitus with hyperglycemia: Secondary | ICD-10-CM | POA: Diagnosis not present

## 2024-06-28 DIAGNOSIS — I10 Essential (primary) hypertension: Secondary | ICD-10-CM | POA: Diagnosis not present

## 2024-06-28 DIAGNOSIS — M1612 Unilateral primary osteoarthritis, left hip: Secondary | ICD-10-CM | POA: Diagnosis not present

## 2024-06-28 DIAGNOSIS — C61 Malignant neoplasm of prostate: Secondary | ICD-10-CM | POA: Diagnosis not present

## 2024-06-28 DIAGNOSIS — F331 Major depressive disorder, recurrent, moderate: Secondary | ICD-10-CM | POA: Diagnosis not present

## 2024-06-28 DIAGNOSIS — E1122 Type 2 diabetes mellitus with diabetic chronic kidney disease: Secondary | ICD-10-CM | POA: Diagnosis not present

## 2024-06-28 DIAGNOSIS — E1165 Type 2 diabetes mellitus with hyperglycemia: Secondary | ICD-10-CM | POA: Diagnosis not present

## 2024-06-28 DIAGNOSIS — I25118 Atherosclerotic heart disease of native coronary artery with other forms of angina pectoris: Secondary | ICD-10-CM | POA: Diagnosis not present

## 2024-07-07 DIAGNOSIS — M25511 Pain in right shoulder: Secondary | ICD-10-CM | POA: Diagnosis not present

## 2024-07-07 DIAGNOSIS — Z96611 Presence of right artificial shoulder joint: Secondary | ICD-10-CM | POA: Diagnosis not present

## 2024-07-09 DIAGNOSIS — Z96611 Presence of right artificial shoulder joint: Secondary | ICD-10-CM | POA: Diagnosis not present

## 2024-07-09 DIAGNOSIS — M25511 Pain in right shoulder: Secondary | ICD-10-CM | POA: Diagnosis not present

## 2024-07-10 NOTE — Progress Notes (Signed)
 Cardiology Office Note:    Date:  07/16/2024   ID:  Paul [name], DOB 06-29-1946, MRN [id number]  PCP:  Aisha Harvey, MD   [facility] Providers Cardiologist:  Keison Glendinning [place], MD     Referring MD: Aisha Harvey, MD   No chief complaint on file.    History of Present Illness:    Paul [name] is a 78 y.o. male with a hx of CAD s/p CABG 2005, carotid artery disease, HTN, HLD, DM II, hypothyroidism and OSA.  Myoview  obtained in November 2016 showed inferolateral infarct with peri-infarct ischemia.  Subsequent cardiac catheterization showed severe disease in SVG to OM 2 treated with stent, occluded SVG to PDA and distal RCA filled with left-to-right collaterals.  He had shortness of breath with Brilinta , after switching to Plavix , shortness of breath resolved.  He has persistent exertional fatigue.  Repeat cardiac catheterization in August 2018 showed patent grafts except for known occluded SVG to RCA, no new disease to explain his symptoms.  Carvedilol  was reduced due to persistent symptoms of dizziness and fatigue. CPX and Echo in 2019 were good.   Last carotid dopplers in November 2024 showed no change.  On follow up today he did have THR with significant improvement in his pain level. Still has spinal stenosis. He has rare limited chest pain. Notes dizziness with standing. BP at home has been excellent. Last A1c down to 6.2%.     Past Medical History:  Diagnosis Date   Allergy    Anxiety    Arthritis    Complication of anesthesia    slow to wake up   Coronary artery disease    a. s/p CABG;  b. 08/2015 MV: inf/inflat infarct w/ new peri-infarct isch; c. LHC 11/16: pLAD 70, dLAD 50, D1 100, oRI 100, mLCx 60, OM2 99, pRCA 99 (L>R collats), S-RPDA 100, S-D1 ok, S-RI/OM2 mid 85 (PCI: Synergy DES), L-LAD ok, EF 55-65%.   Depression    Dyspnea    due to meds pt on per pt   GERD (gastroesophageal reflux disease)    Headache    ocular migraines   History of kidney stones     Hoarseness, chronic    due to sinus issues   Hyperlipidemia    Hypertension    Hypertensive heart disease    Hypothyroidism    Insomnia    Myocardial infarction (HCC)    Neuromuscular disorder (HCC)    polymyalgia rheumatica   OSA (obstructive sleep apnea)    no CPAP   Pneumonia 03/2023   Prostate cancer (HCC)    Type II diabetes mellitus (HCC)     Past Surgical History:  Procedure Laterality Date   CARDIAC CATHETERIZATION N/A 09/19/2015   Procedure: Coronary Stent Intervention;  Surgeon: Emmory Solivan M [place], MD;  Location: [facility] INVASIVE CV LAB;  Service: Cardiovascular;  Laterality: N/A;   CARDIAC CATHETERIZATION  09/19/2015   Procedure: Left Heart Cath and Cors/Grafts Angiography;  Surgeon: Esau Fridman M [place], MD;  Location: [facility] INVASIVE CV LAB;  Service: Cardiovascular;;   CARPAL TUNNEL RELEASE Bilateral    CHOLECYSTECTOMY     Laparoscopic   CORONARY ARTERY BYPASS GRAFT     x5 vessel   EYE SURGERY Bilateral    cataract extraction   EYE SURGERY Left 2024   laser for retina tear   JOINT REPLACEMENT     right knee   LEFT HEART CATH AND CORS/GRAFTS ANGIOGRAPHY N/A 06/26/2017   Procedure: LEFT HEART CATH AND CORS/GRAFTS ANGIOGRAPHY;  Surgeon: [place],  Maude HERO, MD;  Location: [facility] INVASIVE CV LAB;  Service: Cardiovascular;  Laterality: N/A;   left knee replacement      PROSTATE SURGERY     REVERSE SHOULDER ARTHROPLASTY Right 06/23/2021   Procedure: REVERSE SHOULDER ARTHROPLASTY;  Surgeon: Kay Kemps, MD;  Location: [facility] ORS;  Service: Orthopedics;  Laterality: Right;  with ISB   TOTAL HIP ARTHROPLASTY Left 01/28/2024   Procedure: ARTHROPLASTY, HIP, TOTAL, ANTERIOR APPROACH;  Surgeon: Ernie Cough, MD;  Location: [facility] ORS;  Service: Orthopedics;  Laterality: Left;    Current Medications: Current Meds  Medication Sig   acetaminophen  (TYLENOL ) 650 MG CR tablet Take 1,300 mg by mouth every 8 (eight) hours as needed for pain.   amLODipine  (NORVASC ) 5 MG tablet Take 5 mg by mouth daily.    aspirin  81 MG chewable tablet daily.   carvedilol  (COREG ) 12.5 MG tablet Take 1 tablet by mouth twice daily   Cholecalciferol  (VITAMIN D ) 2000 units tablet Take 2,000 Units by mouth daily.   clindamycin  (CLEOCIN ) 300 MG capsule as directed. Dental appts   Continuous Blood Gluc Receiver (FREESTYLE LIBRE 2 READER) DEVI See admin instructions.   Continuous Blood Gluc Sensor (FREESTYLE LIBRE 2 SENSOR) MISC USE AS DIRECTED TO obtain BLOOD GLUCOSE FOUR TIMES DAILY   DULoxetine  (CYMBALTA ) 60 MG capsule Take 60 mg by mouth daily.   empagliflozin  (JARDIANCE ) 25 MG TABS tablet Take 25 mg by mouth daily.   EPINEPHrine  0.3 mg/0.3 mL IJ SOAJ injection Inject 0.3 mg into the muscle daily as needed for anaphylaxis.   ferrous sulfate 325 (65 FE) MG tablet daily with breakfast.   fluticasone  (FLONASE ) 50 MCG/ACT nasal spray Place 2 sprays into both nostrils daily as needed for allergies or rhinitis.   guaiFENesin  (MUCINEX ) 600 MG 12 hr tablet Take 600-1,200 mg by mouth 2 (two) times daily as needed (for sinus drainage/cough/congestion).   levothyroxine  (SYNTHROID ) 100 MCG tablet Take 100 mcg by mouth daily before breakfast.   loperamide  (IMODIUM  A-D) 2 MG tablet Take 2-4 mg by mouth 4 (four) times daily as needed for diarrhea or loose stools.   losartan  (COZAAR ) 100 MG tablet TAKE 1 TABLET BY MOUTH ONCE DAILY.   metFORMIN  (GLUCOPHAGE ) 1000 MG tablet Take 1 tablet (1,000 mg total) by mouth 2 (two) times daily.   nitroGLYCERIN  (NITROSTAT ) 0.4 MG SL tablet Place 1 tablet (0.4 mg total) under the tongue every 5 (five) minutes as needed for chest pain.   Omega-3 Fatty Acids (FISH OIL) 1000 MG CAPS Take 1,000 mg by mouth daily.   pantoprazole  (PROTONIX ) 20 MG tablet Take 1 tablet (20 mg total) by mouth daily.   potassium citrate  (UROCIT-K ) 10 MEQ (1080 MG) SR tablet Take 10 mEq by mouth daily.   rosuvastatin  (CRESTOR ) 10 MG tablet Take 1 tablet by mouth once daily   traZODone  (DESYREL ) 50 MG tablet Take 50 mg by mouth  at bedtime.   triamcinolone  cream (KENALOG ) 0.1 % Apply 1 application topically daily as needed (psoriasis).   TRULICITY 3 MG/0.5ML SOAJ Inject 3 mg into the skin once a week.   vitamin B-12 (CYANOCOBALAMIN ) 250 MCG tablet Take 250 mcg by mouth daily.     Allergies:   Brilinta  [ticagrelor ], Yellow jacket venom, Morphine , Penicillins, Percocet [oxycodone-acetaminophen ], Vicodin [hydrocodone-acetaminophen ], Alprazolam, and Septra ds [sulfamethoxazole-trimethoprim]   Social History   Socioeconomic History   Marital status: Married    Spouse name: Not on file   Number of children: Not on file   Years of education: Not on file  Highest education level: Not on file  Occupational History   Not on file  Tobacco Use   Smoking status: Never   Smokeless tobacco: Never  Vaping Use   Vaping status: Never Used  Substance and Sexual Activity   Alcohol use: No    Alcohol/week: 0.0 standard drinks of alcohol   Drug use: No   Sexual activity: Not Currently  Other Topics Concern   Not on file  Social History Narrative   Not on file   Social Drivers of Health   Financial Resource Strain: Not on file  Food Insecurity: No Food Insecurity (01/28/2024)   Hunger Vital Sign    Worried About Running Out of Food in the Last Year: Never true    Ran Out of Food in the Last Year: Never true  Transportation Needs: No Transportation Needs (01/28/2024)   PRAPARE - Administrator, Civil Service (Medical): No    Lack of Transportation (Non-Medical): No  Physical Activity: Not on file  Stress: Not on file  Social Connections: Moderately Integrated (01/28/2024)   Social Connection and Isolation Panel    Frequency of Communication with Friends and Family: Twice a week    Frequency of Social Gatherings with Friends and Family: Twice a week    Attends Religious Services: 1 to 4 times per year    Active Member of [employer] or Organizations: No    Attends Engineer, structural: Patient declined     Marital Status: Married     Family History: The patient's family history includes Heart attack in his father; Heart disease in his mother; Vascular Disease in his sister.  ROS:   Please see the history of present illness.     All other systems reviewed and are negative.  EKGs/Labs/Other Studies Reviewed:    The following studies were reviewed today:  Cath 06/26/2017 Prox RCA to Dist RCA lesion, 100 %stenosed. Prox LAD to Mid LAD lesion, 70 %stenosed. Dist LAD lesion, 50 %stenosed. 1st Diag lesion, 100 %stenosed. Ost Ramus lesion, 100 %stenosed. 2nd Mrg lesion, 99 %stenosed. Mid Cx to Dist Cx lesion, 60 %stenosed. SVG to RCA- Origin lesion, 100 %stenosed. SVG sequential to ramus intermediate and OM is widely patent. SVG to diagonal is patent. LIMA and is normal in caliber. LV end diastolic pressure is normal.   1. Severe 3 vessel obstructive CAD 2. Patent LIMA to the LAD 3. Patent SVG to ramus and OM branches sequentially. Stent is widely patent 4. Patent SVG to diagonal 5. SVG to RCA known to be occluded. 6. Normal LVEDP   Plan: no new disease to explain symptoms. Recommend continued medical therapy.  Carotid doppler 08/02/21: Summary:  Right Carotid: Velocities in the right ICA are consistent with a 1-39%  stenosis.                 Hemodynamically significant plaque >50% visualized in the  CCA.                 The ECA appears >50% stenosed. Stable RICA velocities.   Left Carotid: Velocities in the left ICA are consistent with a 40-59%  stenosis.                Non-hemodynamically significant plaque <50% noted in the  CCA.                Stable LICA velocities.   Vertebrals:  Left vertebral artery demonstrates antegrade flow. Atypical  antegrade flow in the right vertebral artery.  Subclavians: Bilateral subclavian artery flow was disturbed.   *See table(s) above for measurements and observations.  Suggest follow up study in 12 months.     Electronically signed by Dorn Ross MD on 08/02/2021 at 5:28:48 PM.          EKG Interpretation Date/Time:  Thursday July 16 2024 16:10:05 EDT Ventricular Rate:  70 PR Interval:  184 QRS Duration:  108 QT Interval:  418 QTC Calculation: 451 R Axis:   -27  Text Interpretation: Sinus rhythm with Premature atrial complexes in a pattern of bigeminy When compared with ECG of 23-May-2023 11:21, Premature atrial complexes are now Present Nonspecific T wave abnormality now evident in Inferior leads no evidence of old inferior infarct Confirmed by [place], Johnita Palleschi [phone]) on 07/16/2024 4:38:56 PM    Recent Labs: 01/29/2024: BUN 25; Creatinine, Ser 1.13; Hemoglobin 8.9; Platelets 255; Potassium 4.7; Sodium 135  Recent Lipid Panel    Component Value Date/Time   CHOL 118 05/23/2023 0924   TRIG 226 (H) 05/23/2023 0924   HDL 28 (L) 05/23/2023 0924   CHOLHDL 4.2 05/23/2023 0924   CHOLHDL 4.5 07/20/2016 0950   VLDL 47 (H) 07/20/2016 0950   LDLCALC 53 05/23/2023 0924   LDLDIRECT 76.0 02/28/2015 0939   Dated 01/22/22: A1c 7.3%. BUN 22, creatinine 1.37. cholesterol 123, triglycerides 233, HDL 34, LDL 52. Otherwise CMET normal.        Physical Exam:    VS:  BP 111/64   Pulse 70   Ht 5' 9 (1.753 m)   Wt 188 lb (85.3 kg)   SpO2 94%   BMI 27.76 kg/m     Wt Readings from Last 3 Encounters:  07/16/24 188 lb (85.3 kg)  01/28/24 187 lb (84.8 kg)  01/20/24 187 lb (84.8 kg)     GEN:  Well nourished, well developed in no acute distress HEENT: Normal NECK: No JVD; No carotid bruits LYMPHATICS: No lymphadenopathy CARDIAC: RRR, no murmurs, rubs, gallops RESPIRATORY:  Clear to auscultation without rales, wheezing or rhonchi  ABDOMEN: Soft, non-tender, non-distended MUSCULOSKELETAL:  trace left ankle  edema; No deformity  SKIN: Warm and dry NEUROLOGIC:  Alert and oriented x 3 PSYCHIATRIC:  Normal affect   ASSESSMENT:    1. Benign hypertensive heart disease without heart  failure   2. Angina pectoris (HCC)   3. Coronary artery disease of bypass graft of native heart with stable angina pectoris (HCC)   4. Controlled type 2 diabetes mellitus without complication, without long-term current use of insulin  (HCC)        PLAN:    In order of problems listed above:  CAD: s/p CABG in 2005. S/p stenting of SVG to OM in 2016. Known occlusion of native RCA and SVG to RCA. Cath in 2018 stable. Normal CPX in 2019. Normal LV function on Echo. he has stable class 1-2 angina. PET CT last year showed abnormal perfusion in the inferior wall c/w known RCA disease.  No change.  On aspirin , amlodipine , and carvedilol .   Hypertension: Blood pressure is excellent. Given orthostatic dizziness will reduce amlodipine  to 5 mg daily.   Hyperlipidemia: Continue Crestor . LDL 59 is at goal.  DM2: Managed by primary care provider. A1c improved to 6.2%.   Hypothyroidism on levothyroxine .  6. Carotid arterial disease. Will repeat doppler in Nov.      Medication Adjustments/Labs and Tests Ordered: Current medicines are reviewed at length with the patient today.  Concerns regarding medicines are  outlined above.  Orders Placed This Encounter  Procedures   EKG 12-Lead    No orders of the defined types were placed in this encounter.  Follow up in 6 months   There are no Patient Instructions on file for this visit.   Signed, Kaytlin Burklow [place], MD  07/16/2024 4:35 PM    [facility]

## 2024-07-13 DIAGNOSIS — M25511 Pain in right shoulder: Secondary | ICD-10-CM | POA: Diagnosis not present

## 2024-07-13 DIAGNOSIS — Z96611 Presence of right artificial shoulder joint: Secondary | ICD-10-CM | POA: Diagnosis not present

## 2024-07-15 DIAGNOSIS — M25511 Pain in right shoulder: Secondary | ICD-10-CM | POA: Diagnosis not present

## 2024-07-15 DIAGNOSIS — Z96611 Presence of right artificial shoulder joint: Secondary | ICD-10-CM | POA: Diagnosis not present

## 2024-07-16 ENCOUNTER — Ambulatory Visit: Attending: Cardiology | Admitting: Cardiology

## 2024-07-16 VITALS — BP 111/64 | HR 70 | Ht 69.0 in | Wt 188.0 lb

## 2024-07-16 DIAGNOSIS — I779 Disorder of arteries and arterioles, unspecified: Secondary | ICD-10-CM | POA: Diagnosis not present

## 2024-07-16 DIAGNOSIS — E1122 Type 2 diabetes mellitus with diabetic chronic kidney disease: Secondary | ICD-10-CM | POA: Diagnosis not present

## 2024-07-16 DIAGNOSIS — I25118 Atherosclerotic heart disease of native coronary artery with other forms of angina pectoris: Secondary | ICD-10-CM | POA: Diagnosis not present

## 2024-07-16 DIAGNOSIS — I209 Angina pectoris, unspecified: Secondary | ICD-10-CM

## 2024-07-16 DIAGNOSIS — E1165 Type 2 diabetes mellitus with hyperglycemia: Secondary | ICD-10-CM | POA: Diagnosis not present

## 2024-07-16 DIAGNOSIS — I25708 Atherosclerosis of coronary artery bypass graft(s), unspecified, with other forms of angina pectoris: Secondary | ICD-10-CM

## 2024-07-16 DIAGNOSIS — I119 Hypertensive heart disease without heart failure: Secondary | ICD-10-CM | POA: Diagnosis not present

## 2024-07-16 DIAGNOSIS — I10 Essential (primary) hypertension: Secondary | ICD-10-CM | POA: Diagnosis not present

## 2024-07-16 DIAGNOSIS — E119 Type 2 diabetes mellitus without complications: Secondary | ICD-10-CM | POA: Diagnosis not present

## 2024-07-16 NOTE — Patient Instructions (Addendum)
 Medication Instructions:  Decrease Amlodipine  to 5 mg daily Continue all other medications *If you need a refill on your cardiac medications before your next appointment, please call your pharmacy*  Lab Work: None ordered  Testing/Procedures: Carotid dopplers to be scheduled in Nov   Scheduler will call back with appointment   Follow-Up: At [facility], you and your health needs are our priority.  As part of our continuing mission to provide you with exceptional heart care, our providers are all part of one team.  This team includes your primary Cardiologist (physician) and Advanced Practice Providers or APPs (Physician Assistants and Nurse Practitioners) who all work together to provide you with the care you need, when you need it.  Your next appointment:  6 months    Call in Nov to schedule March appointment     Provider:  Dr.Jordan    We recommend signing up for the patient portal called MyChart.  Sign up information is provided on this After Visit Summary.  MyChart is used to connect with patients for Virtual Visits (Telemedicine).  Patients are able to view lab/test results, encounter notes, upcoming appointments, etc.  Non-urgent messages can be sent to your provider as well.   To learn more about what you can do with MyChart, go to [email or web address].

## 2024-07-17 DIAGNOSIS — Z96611 Presence of right artificial shoulder joint: Secondary | ICD-10-CM | POA: Diagnosis not present

## 2024-07-17 DIAGNOSIS — M25511 Pain in right shoulder: Secondary | ICD-10-CM | POA: Diagnosis not present

## 2024-07-21 DIAGNOSIS — M25511 Pain in right shoulder: Secondary | ICD-10-CM | POA: Diagnosis not present

## 2024-07-21 DIAGNOSIS — Z96611 Presence of right artificial shoulder joint: Secondary | ICD-10-CM | POA: Diagnosis not present

## 2024-07-21 DIAGNOSIS — M75122 Complete rotator cuff tear or rupture of left shoulder, not specified as traumatic: Secondary | ICD-10-CM | POA: Diagnosis not present

## 2024-07-22 DIAGNOSIS — C61 Malignant neoplasm of prostate: Secondary | ICD-10-CM | POA: Diagnosis not present

## 2024-07-29 DIAGNOSIS — C61 Malignant neoplasm of prostate: Secondary | ICD-10-CM | POA: Diagnosis not present

## 2024-08-05 DIAGNOSIS — Z79899 Other long term (current) drug therapy: Secondary | ICD-10-CM | POA: Diagnosis not present

## 2024-08-05 DIAGNOSIS — E039 Hypothyroidism, unspecified: Secondary | ICD-10-CM | POA: Diagnosis not present

## 2024-08-05 DIAGNOSIS — E785 Hyperlipidemia, unspecified: Secondary | ICD-10-CM | POA: Diagnosis not present

## 2024-08-05 DIAGNOSIS — E1122 Type 2 diabetes mellitus with diabetic chronic kidney disease: Secondary | ICD-10-CM | POA: Diagnosis not present

## 2024-08-05 DIAGNOSIS — D508 Other iron deficiency anemias: Secondary | ICD-10-CM | POA: Diagnosis not present

## 2024-08-27 DIAGNOSIS — H34212 Partial retinal artery occlusion, left eye: Secondary | ICD-10-CM | POA: Diagnosis not present

## 2024-08-27 DIAGNOSIS — H59812 Chorioretinal scars after surgery for detachment, left eye: Secondary | ICD-10-CM | POA: Diagnosis not present

## 2024-08-27 DIAGNOSIS — H35372 Puckering of macula, left eye: Secondary | ICD-10-CM | POA: Diagnosis not present

## 2024-08-27 DIAGNOSIS — H35363 Drusen (degenerative) of macula, bilateral: Secondary | ICD-10-CM | POA: Diagnosis not present

## 2024-08-27 DIAGNOSIS — H353131 Nonexudative age-related macular degeneration, bilateral, early dry stage: Secondary | ICD-10-CM | POA: Diagnosis not present

## 2024-09-02 ENCOUNTER — Ambulatory Visit (HOSPITAL_COMMUNITY)
Admission: RE | Admit: 2024-09-02 | Discharge: 2024-09-02 | Disposition: A | Discharge status: Home / Self Care | Source: Ambulatory Visit | Attending: Cardiology | Admitting: Cardiology

## 2024-09-02 ENCOUNTER — Ambulatory Visit: Payer: Self-pay | Admitting: Cardiology

## 2024-09-02 DIAGNOSIS — I779 Disorder of arteries and arterioles, unspecified: Secondary | ICD-10-CM | POA: Insufficient documentation

## 2024-09-02 DIAGNOSIS — I6523 Occlusion and stenosis of bilateral carotid arteries: Secondary | ICD-10-CM | POA: Insufficient documentation

## 2024-11-02 ENCOUNTER — Encounter: Payer: Self-pay | Admitting: Cardiology

## 2024-12-28 ENCOUNTER — Ambulatory Visit: Admitting: Cardiology
